# Patient Record
Sex: Female | Born: 1980 | Race: Black or African American | Hispanic: No | Marital: Single | Smoking: Never smoker
Health system: Southern US, Community
[De-identification: ages and names within clinical notes are randomized; demographics above are authoritative.]

## PROBLEM LIST (undated history)

## (undated) DIAGNOSIS — I10 Essential (primary) hypertension: Secondary | ICD-10-CM

## (undated) DIAGNOSIS — T7840XA Allergy, unspecified, initial encounter: Secondary | ICD-10-CM

## (undated) DIAGNOSIS — D649 Anemia, unspecified: Secondary | ICD-10-CM

## (undated) HISTORY — DX: Anemia, unspecified: D64.9

## (undated) HISTORY — PX: TUBAL LIGATION: SHX77

## (undated) HISTORY — DX: Allergy, unspecified, initial encounter: T78.40XA

## (undated) HISTORY — PX: STENT PLACEMENT VASCULAR (ARMC HX): HXRAD1737

---
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
## 2003-04-20 LAB — HM PAP SMEAR: HM Pap smear: NORMAL

## 2005-03-14 ENCOUNTER — Emergency Department: Payer: Self-pay | Admitting: Unknown Physician Specialty

## 2005-03-21 ENCOUNTER — Emergency Department: Payer: Self-pay | Admitting: Internal Medicine

## 2005-06-17 ENCOUNTER — Emergency Department: Payer: Self-pay | Admitting: Emergency Medicine

## 2005-08-11 ENCOUNTER — Emergency Department: Payer: Self-pay | Admitting: Emergency Medicine

## 2007-07-24 ENCOUNTER — Emergency Department: Payer: Self-pay | Admitting: Emergency Medicine

## 2013-02-19 ENCOUNTER — Emergency Department: Payer: Self-pay | Admitting: Internal Medicine

## 2013-07-28 ENCOUNTER — Emergency Department: Payer: Self-pay | Admitting: Emergency Medicine

## 2014-04-11 ENCOUNTER — Emergency Department: Payer: Self-pay | Admitting: Emergency Medicine

## 2014-04-12 ENCOUNTER — Emergency Department: Payer: Self-pay | Admitting: Emergency Medicine

## 2016-02-07 LAB — HM HIV SCREENING LAB: HM HIV Screening: NEGATIVE

## 2017-05-17 ENCOUNTER — Emergency Department
Admission: EM | Admit: 2017-05-17 | Discharge: 2017-05-17 | Disposition: A | Discharge status: Home / Self Care | Payer: Self-pay | Attending: Emergency Medicine | Admitting: Emergency Medicine

## 2017-05-17 ENCOUNTER — Encounter: Payer: Self-pay | Admitting: Emergency Medicine

## 2017-05-17 ENCOUNTER — Emergency Department: Payer: Self-pay

## 2017-05-17 DIAGNOSIS — W2209XA Striking against other stationary object, initial encounter: Secondary | ICD-10-CM | POA: Insufficient documentation

## 2017-05-17 DIAGNOSIS — Y998 Other external cause status: Secondary | ICD-10-CM | POA: Insufficient documentation

## 2017-05-17 DIAGNOSIS — R51 Headache: Secondary | ICD-10-CM | POA: Insufficient documentation

## 2017-05-17 DIAGNOSIS — Y929 Unspecified place or not applicable: Secondary | ICD-10-CM | POA: Insufficient documentation

## 2017-05-17 DIAGNOSIS — S0181XA Laceration without foreign body of other part of head, initial encounter: Secondary | ICD-10-CM | POA: Insufficient documentation

## 2017-05-17 DIAGNOSIS — Y9389 Activity, other specified: Secondary | ICD-10-CM | POA: Insufficient documentation

## 2017-05-17 DIAGNOSIS — S0990XA Unspecified injury of head, initial encounter: Secondary | ICD-10-CM

## 2017-05-17 MED ORDER — LIDOCAINE-EPINEPHRINE-TETRACAINE (LET) SOLUTION
3.0000 mL | Freq: Once | NASAL | Status: DC
  Filled 2017-05-17: qty 3

## 2017-05-17 NOTE — ED Provider Notes (Signed)
[facility] EMERGENCY DEPARTMENT Provider Note   CSN: [id number] Arrival date & time: 05/17/17  8938     History   Chief Complaint Chief Complaint  Patient presents with  . Facial Laceration    HPI Kathleen [name] is a 37 y.o. female resents to the emergency department for evaluation of a laceration to the left side of her forehead.  Patient states around 2 in the morning she hit her head on the corner of a dresser.  She is uncertain if she lost consciousness.  She is having some mild pain around the area but denies any significant headache, nausea or vomiting.  She denies any dizziness, lightheadedness, neck pain, numbness tingling or radicular symptoms.  No other pain throughout her body.  The patient's pain is 5 out of 10.  She gets some relief with ibuprofen earlier.  Her tetanus is up-to-date.  HPI  History reviewed. No pertinent past medical history.  There are no active problems to display for this patient.    OB History    No data available       Home Medications    Prior to Admission medications   Not on File    Family History No family history on file.  Social History Social History   Tobacco Use  . Smoking status: Not on file  Substance Use Topics  . Alcohol use: Not on file  . Drug use: Not on file     Allergies   Patient has no known allergies.   Review of Systems Review of Systems  Constitutional: Negative for activity change, chills, fatigue and fever.  Eyes: Negative for visual disturbance.  Respiratory: Negative for cough, chest tightness and shortness of breath.   Cardiovascular: Negative for chest pain and leg swelling.  Gastrointestinal: Negative for abdominal pain, diarrhea, nausea and vomiting.  Genitourinary: Negative for dysuria.  Musculoskeletal: Negative for arthralgias and gait problem.  Skin: Negative for rash.  Neurological: Positive for headaches (.  Round area of laceration only). Negative for  dizziness, weakness and numbness.  Hematological: Negative for adenopathy.  Psychiatric/Behavioral: Negative for agitation, behavioral problems and confusion.     Physical Exam Updated Vital Signs BP (!) 146/107 (BP Location: Left Arm)   Pulse (!) 114   Temp 98.2 F (36.8 C) (Oral)   Resp 16   Ht 5\' 6"  (1.676 m)   Wt 79.4 kg (175 lb)   LMP 04/23/2017 (Approximate)   SpO2 100%   BMI 28.25 kg/m   Physical Exam  Constitutional: She is oriented to person, place, and time. She appears well-developed and well-nourished.  HENT:  Head: Normocephalic and atraumatic.  Right Ear: External ear normal.  Left Ear: External ear normal.  Eyes: Conjunctivae and EOM are normal. Pupils are equal, round, and reactive to light.  Neck: Normal range of motion.  Cardiovascular: Normal rate.  Pulmonary/Chest: Effort normal. No respiratory distress.  Musculoskeletal: Normal range of motion.  Neurological: She is alert and oriented to person, place, and time.  Skin: Skin is warm. No rash noted.  3 cm linear laceration on the left forehead, no palpable or visible foreign body.  Area is tender around the forehead, nontender around the supraorbital rim of the left eye.  Full range of motion of both eyes with normal pupils.  Psychiatric: She has a normal mood and affect. Her behavior is normal. Thought content normal.     ED Treatments / Results  Labs (all labs ordered are listed, but only abnormal  results are displayed) Labs Reviewed - No data to display  EKG  EKG Interpretation None       Radiology Ct Head Wo Contrast  Result Date: 05/17/2017 CLINICAL DATA:  37 year old female with acute headache following fall and head injury today. Initial encounter. EXAM: CT HEAD WITHOUT CONTRAST TECHNIQUE: Contiguous axial images were obtained from the base of the skull through the vertex without intravenous contrast. COMPARISON:  None. FINDINGS: Brain: No evidence of acute infarction, hemorrhage,  hydrocephalus, extra-axial collection or mass lesion/mass effect. Vascular: No hyperdense vessel or unexpected calcification. Skull: Normal. Negative for fracture or focal lesion. Sinuses/Orbits: No acute finding. Other: Left forehead soft tissue swelling/laceration noted. IMPRESSION: 1. No evidence of intracranial abnormality 2. Left forehead soft tissue swelling/laceration without fracture. Electronically Signed   By: Margarette Canada M.D.   On: 05/17/2017 10:47    Procedures .Marland KitchenLaceration Repair Date/Time: 05/17/2017 11:50 AM Performed by: Duanne Guess, PA-C Authorized by: Duanne Guess, PA-C   Consent:    Consent obtained:  Verbal   Consent given by:  Patient   Risks discussed:  Infection, pain, need for additional repair, poor wound healing and poor cosmetic result   Alternatives discussed:  No treatment Anesthesia (see MAR for exact dosages):    Anesthesia method:  Topical application   Topical anesthetic:  LET Laceration details:    Location:  Face   Face location:  Forehead   Length (cm):  3   Depth (mm):  3 Repair type:    Repair type:  Simple Pre-procedure details:    Preparation:  Patient was prepped and draped in usual sterile fashion Exploration:    Hemostasis achieved with:  LET   Wound exploration: wound explored through full range of motion     Contaminated: no   Treatment:    Area cleansed with:  Betadine and saline   Amount of cleaning:  Standard   Irrigation solution:  Sterile saline   Visualized foreign bodies/material removed: no   Skin repair:    Repair method:  Sutures   Suture size:  6-0   Suture technique:  Simple interrupted   Number of sutures:  5 Approximation:    Approximation:  Close   Vermilion border: well-aligned   Post-procedure details:    Dressing:  Adhesive bandage   Patient tolerance of procedure:  Tolerated well, no immediate complications   (including critical care time)  Medications Ordered in ED Medications    lidocaine-EPINEPHrine-tetracaine (LET) solution (not administered)     Initial Impression / Assessment and Plan / ED Course  I have reviewed the triage vital signs and the nursing notes.  Pertinent labs & imaging results that were available during my care of the patient were reviewed by me and considered in my medical decision making (see chart for details).     37 year old female in the emergency department for forehead trauma with laceration.  CT of the.  CT of the head reviewed by me today.  Laceration repaired with #5 6-0 nylon sutures.  Patient educated on signs and symptoms return to the ED for.  She will follow-up 5-6 days for suture removal.  Final Clinical Impressions(s) / ED Diagnoses   Final diagnoses:  Facial laceration, initial encounter  Injury of head, initial encounter    ED Discharge Orders    None       Duanne Guess, PA-C 05/17/17 1152    Darel Hong, MD 05/18/17 [phone]

## 2017-05-17 NOTE — ED Triage Notes (Signed)
Pt reports falling and hitting head on corner of TV stand, denies LOC. Pt reports she had been drinking, reports it was before 2am.

## 2017-05-17 NOTE — Discharge Instructions (Signed)
You may shower but do not submerge incision site laceration site under water.  Follow-up in 5-6 days for suture removal.  If any redness warmth or drainage return to the clinic sooner.  1 week after suture removal please apply prosil scar cream.

## 2017-05-24 ENCOUNTER — Encounter: Payer: Self-pay | Admitting: Emergency Medicine

## 2017-05-24 ENCOUNTER — Emergency Department
Admission: EM | Admit: 2017-05-24 | Discharge: 2017-05-24 | Disposition: A | Discharge status: Home / Self Care | Payer: Self-pay | Attending: Emergency Medicine | Admitting: Emergency Medicine

## 2017-05-24 DIAGNOSIS — W269XXD Contact with unspecified sharp object(s), subsequent encounter: Secondary | ICD-10-CM | POA: Insufficient documentation

## 2017-05-24 DIAGNOSIS — S0181XD Laceration without foreign body of other part of head, subsequent encounter: Secondary | ICD-10-CM | POA: Insufficient documentation

## 2017-05-24 DIAGNOSIS — Z4802 Encounter for removal of sutures: Secondary | ICD-10-CM | POA: Insufficient documentation

## 2017-05-24 NOTE — Discharge Instructions (Signed)
Follow-up with Dr. of your choice or [facility] if any continued problems.  You also need to have your blood pressure rechecked as it was elevated in the department today.  Blood pressure was 116/98.

## 2017-05-24 NOTE — ED Provider Notes (Signed)
   [facility] Emergency Department Provider Note  ___________________________________________   First MD Initiated Contact with Patient 05/24/17 1203     (approximate)  I have reviewed the triage vital signs and the nursing notes.   HISTORY  Chief Complaint Suture / Staple Removal   HPI Kathleen [name] is a 37 y.o. female is here for suture removal.  Patient was seen 7 days ago for laceration to her left forehead.  She denies any continued problems.  History reviewed. No pertinent past medical history.  There are no active problems to display for this patient.   History reviewed. No pertinent surgical history.  Prior to Admission medications   Not on File    Allergies Patient has no known allergies.  No family history on file.  Social History Social History   Tobacco Use  . Smoking status: Not on file  Substance Use Topics  . Alcohol use: Not on file  . Drug use: Not on file    Review of Systems Constitutional: No fever/chills Eyes: No visual changes. ENT: No complaints. Cardiovascular: Denies chest pain. Respiratory: Denies shortness of breath. Skin: Healing laceration. Neurological: Negative for headaches ____________________________________________   PHYSICAL EXAM:  VITAL SIGNS: ED Triage Vitals  Enc Vitals Group     BP 05/24/17 1105 (!) 116/98     Pulse Rate 05/24/17 1105 84     Resp 05/24/17 1105 18     Temp 05/24/17 1105 97.7 F (36.5 C)     Temp Source 05/24/17 1105 Oral     SpO2 05/24/17 1105 98 %     Weight 05/24/17 1105 175 lb (79.4 kg)     Height 05/24/17 1105 5\' 6"  (1.676 m)     Head Circumference --      Peak Flow --      Pain Score 05/24/17 1154 0     Pain Loc --      Pain Edu? --      Excl. in [place]? --    Constitutional: Alert and oriented. Well appearing and in no acute distress. Eyes: Conjunctivae are normal.  Head: Atraumatic. Neck: No stridor.   Cardiovascular:  Good peripheral  circulation. Respiratory: Normal respiratory effort.  No retractions. Lungs CTAB. Skin:  Skin is warm, dry.  Suture site on the left forehead is healing without any signs of infection. Psychiatric: Mood and affect are normal. Speech and behavior are normal.  ____________________________________________   LABS (all labs ordered are listed, but only abnormal results are displayed)  Labs Reviewed - No data to display   PROCEDURES  Procedure(s) performed: Sutures were removed by RN.  Procedures  Critical Care performed: No  ____________________________________________   INITIAL IMPRESSION / ASSESSMENT AND PLAN / ED COURSE  Patient will continue keeping area clean and dry and watch for any signs of infection.  She will follow-up with her PCP or [facility] if any continued problems.  ____________________________________________   FINAL CLINICAL IMPRESSION(S) / ED DIAGNOSES  Final diagnoses:  Encounter for removal of sutures     ED Discharge Orders    None       Note:  This document was prepared using Dragon voice recognition software and may include unintentional dictation errors.   Johnn Hai, PA-C 05/24/17 1230  Nena Polio, MD 05/24/17 [phone]

## 2017-05-24 NOTE — ED Notes (Signed)

## 2017-05-24 NOTE — ED Triage Notes (Signed)
Pt reports here for suture removal. Stitches on left side of forehead. Pt reports no concern for infection.

## 2018-02-21 ENCOUNTER — Encounter: Payer: Self-pay | Admitting: Nurse Practitioner

## 2018-02-22 ENCOUNTER — Ambulatory Visit (INDEPENDENT_AMBULATORY_CARE_PROVIDER_SITE_OTHER): Payer: Self-pay | Admitting: Nurse Practitioner

## 2018-02-22 ENCOUNTER — Encounter: Payer: Self-pay | Admitting: Nurse Practitioner

## 2018-02-22 ENCOUNTER — Other Ambulatory Visit: Payer: Self-pay

## 2018-02-22 VITALS — BP 148/96 | HR 94 | Temp 97.5°F | Ht 66.6 in | Wt 215.0 lb

## 2018-02-22 DIAGNOSIS — E669 Obesity, unspecified: Secondary | ICD-10-CM

## 2018-02-22 DIAGNOSIS — F418 Other specified anxiety disorders: Secondary | ICD-10-CM | POA: Insufficient documentation

## 2018-02-22 DIAGNOSIS — I10 Essential (primary) hypertension: Secondary | ICD-10-CM | POA: Insufficient documentation

## 2018-02-22 DIAGNOSIS — E66811 Obesity, class 1: Secondary | ICD-10-CM | POA: Insufficient documentation

## 2018-02-22 MED ORDER — LISINOPRIL 5 MG PO TABS: 5.0000 mg | ORAL_TABLET | Freq: Every day | ORAL | 3 refills | Status: DC

## 2018-02-22 NOTE — Assessment & Plan Note (Signed)
New diagnosis.  Patient self-pay.  Initiate Lisinopril 5 MG by mouth today.  Return in 4 weeks for follow-up visit with labs (will have insurance at this time).

## 2018-02-22 NOTE — Patient Instructions (Signed)
Buspirone tablets What is this medicine? BUSPIRONE (byoo SPYE rone) is used to treat anxiety disorders. This medicine may be used for other purposes; ask your health care provider or pharmacist if you have questions. COMMON BRAND NAME(S): BuSpar What should I tell my health care provider before I take this medicine? They need to know if you have any of these conditions: -kidney or liver disease -an unusual or allergic reaction to buspirone, other medicines, foods, dyes, or preservatives -pregnant or trying to get pregnant -breast-feeding How should I use this medicine? Take this medicine by mouth with a glass of water. Follow the directions on the prescription label. You may take this medicine with or without food. To ensure that this medicine always works the same way for you, you should take it either always with or always without food. Take your doses at regular intervals. Do not take your medicine more often than directed. Do not stop taking except on the advice of your doctor or health care professional. Talk to your pediatrician regarding the use of this medicine in children. Special care may be needed. Overdosage: If you think you have taken too much of this medicine contact a poison control center or emergency room at once. NOTE: This medicine is only for you. Do not share this medicine with others. What if I miss a dose? If you miss a dose, take it as soon as you can. If it is almost time for your next dose, take only that dose. Do not take double or extra doses. What may interact with this medicine? Do not take this medicine with any of the following medications: -linezolid -MAOIs like Carbex, Eldepryl, Marplan, Nardil, and Parnate -methylene blue -procarbazine This medicine may also interact with the following medications: -diazepam -digoxin -diltiazem -erythromycin -grapefruit juice -haloperidol -medicines for mental depression or mood problems -medicines for seizures like  carbamazepine, phenobarbital and phenytoin -nefazodone -other medications for anxiety -rifampin -ritonavir -some antifungal medicines like itraconazole, ketoconazole, and voriconazole -verapamil -warfarin This list may not describe all possible interactions. Give your health care provider a list of all the medicines, herbs, non-prescription drugs, or dietary supplements you use. Also tell them if you smoke, drink alcohol, or use illegal drugs. Some items may interact with your medicine. What should I watch for while using this medicine? Visit your doctor or health care professional for regular checks on your progress. It may take 1 to 2 weeks before your anxiety gets better. You may get drowsy or dizzy. Do not drive, use machinery, or do anything that needs mental alertness until you know how this drug affects you. Do not stand or sit up quickly, especially if you are an older patient. This reduces the risk of dizzy or fainting spells. Alcohol can make you more drowsy and dizzy. Avoid alcoholic drinks. What side effects may I notice from receiving this medicine? Side effects that you should report to your doctor or health care professional as soon as possible: -blurred vision or other vision changes -chest pain -confusion -difficulty breathing -feelings of hostility or anger -muscle aches and pains -numbness or tingling in hands or feet -ringing in the ears -skin rash and itching -vomiting -weakness Side effects that usually do not require medical attention (report to your doctor or health care professional if they continue or are bothersome): -disturbed dreams, nightmares -headache -nausea -restlessness or nervousness -sore throat and nasal congestion -stomach upset This list may not describe all possible side effects. Call your doctor for medical advice about side   effects. You may report side effects to FDA at 1-800-FDA-1088. Where should I keep my medicine? Keep out of the reach  of children. Store at room temperature below 30 degrees C (86 degrees F). Protect from light. Keep container tightly closed. Throw away any unused medicine after the expiration date. NOTE: This sheet is a summary. It may not cover all possible information. If you have questions about this medicine, talk to your doctor, pharmacist, or health care provider.  2018 [employer]/Gold Standard (2009-11-01 18:06:11) Sertraline tablets What is this medicine? SERTRALINE (SER tra leen) is used to treat depression. It may also be used to treat obsessive compulsive disorder, panic disorder, post-trauma stress, premenstrual dysphoric disorder (PMDD) or social anxiety. This medicine may be used for other purposes; ask your health care provider or pharmacist if you have questions. COMMON BRAND NAME(S): Zoloft What should I tell my health care provider before I take this medicine? They need to know if you have any of these conditions: -bleeding disorders -bipolar disorder or a family history of bipolar disorder -glaucoma -heart disease -high blood pressure -history of irregular heartbeat -history of low levels of calcium, magnesium, or potassium in the blood -if you often drink alcohol -liver disease -receiving electroconvulsive therapy -seizures -suicidal thoughts, plans, or attempt; a previous suicide attempt by you or a family member -take medicines that treat or prevent blood clots -thyroid disease -an unusual or allergic reaction to sertraline, other medicines, foods, dyes, or preservatives -pregnant or trying to get pregnant -breast-feeding How should I use this medicine? Take this medicine by mouth with a glass of water. Follow the directions on the prescription label. You can take it with or without food. Take your medicine at regular intervals. Do not take your medicine more often than directed. Do not stop taking this medicine suddenly except upon the advice of your doctor. Stopping this medicine  too quickly may cause serious side effects or your condition may worsen. A special MedGuide will be given to you by the pharmacist with each prescription and refill. Be sure to read this information carefully each time. Talk to your pediatrician regarding the use of this medicine in children. While this drug may be prescribed for children as young as 7 years for selected conditions, precautions do apply. Overdosage: If you think you have taken too much of this medicine contact a poison control center or emergency room at once. NOTE: This medicine is only for you. Do not share this medicine with others. What if I miss a dose? If you miss a dose, take it as soon as you can. If it is almost time for your next dose, take only that dose. Do not take double or extra doses. What may interact with this medicine? Do not take this medicine with any of the following medications: -cisapride -dofetilide -dronedarone -linezolid -MAOIs like Carbex, Eldepryl, Marplan, Nardil, and Parnate -methylene blue (injected into a vein) -pimozide -thioridazine This medicine may also interact with the following medications: -alcohol -amphetamines -aspirin and aspirin-like medicines -certain medicines for depression, anxiety, or psychotic disturbances -certain medicines for fungal infections like ketoconazole, fluconazole, posaconazole, and itraconazole -certain medicines for irregular heart beat like flecainide, quinidine, propafenone -certain medicines for migraine headaches like almotriptan, eletriptan, frovatriptan, naratriptan, rizatriptan, sumatriptan, zolmitriptan -certain medicines for sleep -certain medicines for seizures like carbamazepine, valproic acid, phenytoin -certain medicines that treat or prevent blood clots like warfarin, enoxaparin, dalteparin -cimetidine -digoxin -diuretics -fentanyl -isoniazid -lithium -NSAIDs, medicines for pain and inflammation, like ibuprofen or naproxen -other  medicines that prolong the QT interval (cause an abnormal heart rhythm) -rasagiline -safinamide -supplements like St. John's wort, kava kava, valerian -tolbutamide -tramadol -tryptophan This list may not describe all possible interactions. Give your health care provider a list of all the medicines, herbs, non-prescription drugs, or dietary supplements you use. Also tell them if you smoke, drink alcohol, or use illegal drugs. Some items may interact with your medicine. What should I watch for while using this medicine? Tell your doctor if your symptoms do not get better or if they get worse. Visit your doctor or health care professional for regular checks on your progress. Because it may take several weeks to see the full effects of this medicine, it is important to continue your treatment as prescribed by your doctor. Patients and their families should watch out for new or worsening thoughts of suicide or depression. Also watch out for sudden changes in feelings such as feeling anxious, agitated, panicky, irritable, hostile, aggressive, impulsive, severely restless, overly excited and hyperactive, or not being able to sleep. If this happens, especially at the beginning of treatment or after a change in dose, call your health care professional. Dennis Bast may get drowsy or dizzy. Do not drive, use machinery, or do anything that needs mental alertness until you know how this medicine affects you. Do not stand or sit up quickly, especially if you are an older patient. This reduces the risk of dizzy or fainting spells. Alcohol may interfere with the effect of this medicine. Avoid alcoholic drinks. Your mouth may get dry. Chewing sugarless gum or sucking hard candy, and drinking plenty of water may help. Contact your doctor if the problem does not go away or is severe. What side effects may I notice from receiving this medicine? Side effects that you should report to your doctor or health care professional as soon  as possible: -allergic reactions like skin rash, itching or hives, swelling of the face, lips, or tongue -anxious -black, tarry stools -changes in vision -confusion -elevated mood, decreased need for sleep, racing thoughts, impulsive behavior -eye pain -fast, irregular heartbeat -feeling faint or lightheaded, falls -feeling agitated, angry, or irritable -hallucination, loss of contact with reality -loss of balance or coordination -loss of memory -painful or prolonged erections -restlessness, pacing, inability to keep still -seizures -stiff muscles -suicidal thoughts or other mood changes -trouble sleeping -unusual bleeding or bruising -unusually weak or tired -vomiting Side effects that usually do not require medical attention (report to your doctor or health care professional if they continue or are bothersome): -change in appetite or weight -change in sex drive or performance -diarrhea -increased sweating -indigestion, nausea -tremors This list may not describe all possible side effects. Call your doctor for medical advice about side effects. You may report side effects to FDA at 1-800-FDA-1088. Where should I keep my medicine? Keep out of the reach of children. Store at room temperature between 15 and 30 degrees C (59 and 86 degrees F). Throw away any unused medicine after the expiration date. NOTE: This sheet is a summary. It may not cover all possible information. If you have questions about this medicine, talk to your doctor, pharmacist, or health care provider.  2018 [employer]/Gold Standard (2016-03-28 14:17:49) Citalopram tablets What is this medicine? CITALOPRAM (sye TAL oh pram) is a medicine for depression. This medicine may be used for other purposes; ask your health care provider or pharmacist if you have questions. COMMON BRAND NAME(S): Celexa What should I tell my health care provider before I  take this medicine? They need to know if you have any of these  conditions: -bleeding disorders -bipolar disorder or a family history of bipolar disorder -glaucoma -heart disease -history of irregular heartbeat -kidney disease -liver disease -low levels of magnesium or potassium in the blood -receiving electroconvulsive therapy -seizures -suicidal thoughts, plans, or attempt; a previous suicide attempt by you or a family member -take medicines that treat or prevent blood clots -thyroid disease -an unusual or allergic reaction to citalopram, escitalopram, other medicines, foods, dyes, or preservatives -pregnant or trying to become pregnant -breast-feeding How should I use this medicine? Take this medicine by mouth with a glass of water. Follow the directions on the prescription label. You can take it with or without food. Take your medicine at regular intervals. Do not take your medicine more often than directed. Do not stop taking this medicine suddenly except upon the advice of your doctor. Stopping this medicine too quickly may cause serious side effects or your condition may worsen. A special MedGuide will be given to you by the pharmacist with each prescription and refill. Be sure to read this information carefully each time. Talk to your pediatrician regarding the use of this medicine in children. Special care may be needed. Patients over 52 years old may have a stronger reaction and need a smaller dose. Overdosage: If you think you have taken too much of this medicine contact a poison control center or emergency room at once. NOTE: This medicine is only for you. Do not share this medicine with others. What if I miss a dose? If you miss a dose, take it as soon as you can. If it is almost time for your next dose, take only that dose. Do not take double or extra doses. What may interact with this medicine? Do not take this medicine with any of the following medications: -certain medicines for fungal infections like fluconazole, itraconazole,  ketoconazole, posaconazole, voriconazole -cisapride -dofetilide -dronedarone -escitalopram -linezolid -MAOIs like Carbex, Eldepryl, Marplan, Nardil, and Parnate -methylene blue (injected into a vein) -pimozide -thioridazine -ziprasidone This medicine may also interact with the following medications: -alcohol -amphetamines -aspirin and aspirin-like medicines -carbamazepine -certain medicines for depression, anxiety, or psychotic disturbances -certain medicines for infections like chloroquine, clarithromycin, erythromycin, furazolidone, isoniazid, pentamidine -certain medicines for migraine headaches like almotriptan, eletriptan, frovatriptan, naratriptan, rizatriptan, sumatriptan, zolmitriptan -certain medicines for sleep -certain medicines that treat or prevent blood clots like dalteparin, enoxaparin, warfarin -cimetidine -diuretics -fentanyl -lithium -methadone -metoprolol -NSAIDs, medicines for pain and inflammation, like ibuprofen or naproxen -omeprazole -other medicines that prolong the QT interval (cause an abnormal heart rhythm) -procarbazine -rasagiline -supplements like St. John's wort, kava kava, valerian -tramadol -tryptophan This list may not describe all possible interactions. Give your health care provider a list of all the medicines, herbs, non-prescription drugs, or dietary supplements you use. Also tell them if you smoke, drink alcohol, or use illegal drugs. Some items may interact with your medicine. What should I watch for while using this medicine? Tell your doctor if your symptoms do not get better or if they get worse. Visit your doctor or health care professional for regular checks on your progress. Because it may take several weeks to see the full effects of this medicine, it is important to continue your treatment as prescribed by your doctor. Patients and their families should watch out for new or worsening thoughts of suicide or depression. Also watch  out for sudden changes in feelings such as feeling anxious, agitated, panicky, irritable, hostile,  aggressive, impulsive, severely restless, overly excited and hyperactive, or not being able to sleep. If this happens, especially at the beginning of treatment or after a change in dose, call your health care professional. Dennis Bast may get drowsy or dizzy. Do not drive, use machinery, or do anything that needs mental alertness until you know how this medicine affects you. Do not stand or sit up quickly, especially if you are an older patient. This reduces the risk of dizzy or fainting spells. Alcohol may interfere with the effect of this medicine. Avoid alcoholic drinks. Your mouth may get dry. Chewing sugarless gum or sucking hard candy, and drinking plenty of water will help. Contact your doctor if the problem does not go away or is severe. What side effects may I notice from receiving this medicine? Side effects that you should report to your doctor or health care professional as soon as possible: -allergic reactions like skin rash, itching or hives, swelling of the face, lips, or tongue -anxious -black, tarry stools -breathing problems -changes in vision -chest pain -confusion -elevated mood, decreased need for sleep, racing thoughts, impulsive behavior -eye pain -fast, irregular heartbeat -feeling faint or lightheaded, falls -feeling agitated, angry, or irritable -hallucination, loss of contact with reality -loss of balance or coordination -loss of memory -painful or prolonged erections -restlessness, pacing, inability to keep still -seizures -stiff muscles -suicidal thoughts or other mood changes -trouble sleeping -unusual bleeding or bruising -unusually weak or tired -vomiting Side effects that usually do not require medical attention (report to your doctor or health care professional if they continue or are bothersome): -change in appetite or weight -change in sex drive or  performance -dizziness -headache -increased sweating -indigestion, nausea -tremors This list may not describe all possible side effects. Call your doctor for medical advice about side effects. You may report side effects to FDA at 1-800-FDA-1088. Where should I keep my medicine? Keep out of reach of children. Store at room temperature between 15 and 30 degrees C (59 and 86 degrees F). Throw away any unused medicine after the expiration date. NOTE: This sheet is a summary. It may not cover all possible information. If you have questions about this medicine, talk to your doctor, pharmacist, or health care provider.  2018 [employer]/Gold Standard (2015-08-27 13:18:52)

## 2018-02-22 NOTE — Assessment & Plan Note (Signed)
Discussed benefit of diet and exercise focus.  Focusing on frequent, small healthy meals and 30 minutes of exercise 5 days a week.

## 2018-02-22 NOTE — Progress Notes (Signed)
BP (!) 148/96 (BP Location: Left Arm, Patient Position: Sitting)   Pulse 94   Temp (!) 97.5 F (36.4 C) (Oral)   Ht 5' 6.6" (1.692 m)   Wt 215 lb (97.5 kg)   SpO2 100%   BMI 34.08 kg/m    Subjective:    Patient ID: Kathleen [name], female    DOB: [birth year], 37 y.o.   MRN: [id number]  HPI: Kathleen [name] is a 37 y.o. female presents for new patient visit.  Introduced to Designer, jewellery role and practice setting.  All questions and concerns addressed.  Currently self-pay with no insurance while awaiting new job paper work.  Chief Complaint  Patient presents with  . Establish Care    pt would like to discuss about her blood pressure   HYPERTENSION: Ms. Pilkenton is a 37 yo [place] American female who presents for new patient visit with a form for provider to fill out.  Form is in regard to her current elevation in BP, as was noticed at her new workplace physical.  Has been out of work since beginning of October and then her car had issues, so many stressors and increased anxiety reported.  Currently, has opportunity to work for [employer] in [place] and they noted her BP was elevated when she was sent to [facility] for pre-employment physical.  She has been offered position, but needed a PCP visit and assessment of BP.  Discussed at length with her family history.  Mother with sickle cell disorder and CHF + arthritis.  Patient reports she has no h/o elevated BP, but states she has not been seen by a PCP in a few years.  We discussed various treatment options.    ANXIETY: Currently situational in appearance, as increased anxiety with loss of job recently and then recent elevation in BP at new workplace physical; causing a delay in her starting new job.  She reports this is the longest she has been without a job and it has been "very stressful".  Is a single mother and depends on income.  Discussed various treatment options available for treatment of anxiety.  At this time patient wishes to  read more on medications available.  Printed out information on Citalopram, Buspar, and Sertraline for patient.  GAD 7 : Generalized Anxiety Score 02/22/2018  Nervous, Anxious, on Edge 2  Control/stop worrying 3  Worry too much - different things 2  Trouble relaxing 0  Restless 0  Easily annoyed or irritable 3  Afraid - awful might happen 1  Total GAD 7 Score 11    Depression screen PHQ 2/9 02/22/2018  Decreased Interest 1  Down, Depressed, Hopeless 0  PHQ - 2 Score 1  Altered sleeping 1  Tired, decreased energy 1  Change in appetite 0  Feeling bad or failure about yourself  1  Trouble concentrating 0  Moving slowly or fidgety/restless 0  Suicidal thoughts 0  PHQ-9 Score 4  Difficult doing work/chores Not difficult at all   Functional Status Survey: Is the patient deaf or have difficulty hearing?: No Does the patient have difficulty seeing, even when wearing glasses/contacts?: No Does the patient have difficulty concentrating, remembering, or making decisions?: No Does the patient have difficulty walking or climbing stairs?: No Does the patient have difficulty dressing or bathing?: No Does the patient have difficulty doing errands alone such as visiting a doctor's office or shopping?: No   Social History   Socioeconomic History  . Marital status: Single  Spouse name: Not on file  . Number of children: Not on file  . Years of education: Not on file  . Highest education level: Not on file  Occupational History  . Not on file  Social Needs  . Financial resource strain: Very hard  . Food insecurity:    Worry: Never true    Inability: Never true  . Transportation needs:    Medical: No    Non-medical: No  Tobacco Use  . Smoking status: Never Smoker  . Smokeless tobacco: Never Used  Substance and Sexual Activity  . Alcohol use: Yes    Alcohol/week: 1.0 standard drinks    Types: 1 Glasses of wine per week  . Drug use: Not Currently  . Sexual activity: Yes    Lifestyle  . Physical activity:    Days per week: Not on file    Minutes per session: Not on file  . Stress: Not on file  Relationships  . Social connections:    Talks on phone: Not on file    Gets together: Not on file    Attends religious service: Not on file    Active member of club or organization: Not on file    Attends meetings of clubs or organizations: Not on file    Relationship status: Not on file  . Intimate partner violence:    Fear of current or ex partner: Not on file    Emotionally abused: Not on file    Physically abused: Not on file    Forced sexual activity: Not on file  Other Topics Concern  . Not on file  Social History Narrative  . Not on file    Relevant past medical, surgical, family and social history reviewed and updated as indicated. Interim medical history since our last visit reviewed. Allergies and medications reviewed and updated.  Review of Systems  Constitutional: Negative for activity change, appetite change, diaphoresis, fatigue, fever and unexpected weight change.  HENT: Negative.   Eyes: Negative for pain, redness and visual disturbance.  Respiratory: Negative for cough, chest tightness, shortness of breath and wheezing.   Cardiovascular: Negative for chest pain, palpitations and leg swelling.  Gastrointestinal: Negative for abdominal distention, abdominal pain, constipation, diarrhea, nausea and vomiting.  Endocrine: Negative for cold intolerance, heat intolerance, polydipsia, polyphagia and polyuria.  Genitourinary: Negative.   Musculoskeletal: Negative.   Skin: Negative.   Allergic/Immunologic: Negative.   Neurological: Negative for dizziness, syncope, weakness, light-headedness, numbness and headaches.  Psychiatric/Behavioral: Negative.     Per HPI unless specifically indicated above     Objective:    BP (!) 148/96 (BP Location: Left Arm, Patient Position: Sitting)   Pulse 94   Temp (!) 97.5 F (36.4 C) (Oral)   Ht 5' 6.6"  (1.692 m)   Wt 215 lb (97.5 kg)   SpO2 100%   BMI 34.08 kg/m   Wt Readings from Last 3 Encounters:  02/22/18 215 lb (97.5 kg)  05/24/17 175 lb (79.4 kg)  05/17/17 175 lb (79.4 kg)    Physical Exam  Constitutional: She is oriented to person, place, and time. She appears well-developed and well-nourished.  HENT:  Head: Normocephalic and atraumatic.  Eyes: Pupils are equal, round, and reactive to light. Conjunctivae and EOM are normal. Right eye exhibits no discharge. Left eye exhibits no discharge.  Neck: Normal range of motion. Neck supple. No JVD present. Carotid bruit is not present. No thyromegaly present.  Cardiovascular: Normal rate, regular rhythm and normal heart sounds. Exam reveals  no gallop.  No murmur heard. Pulmonary/Chest: Effort normal and breath sounds normal.  Abdominal: Soft. Bowel sounds are normal.  Musculoskeletal: Normal range of motion. She exhibits no edema.  Lymphadenopathy:    She has no cervical adenopathy.  Neurological: She is alert and oriented to person, place, and time.  Skin: Skin is warm and dry.  Psychiatric: She has a normal mood and affect. Her behavior is normal. Judgment and thought content normal.  Periods of tearfulness discussing current stressors.    No results found for this or any previous visit.    Assessment & Plan:   Problem List Items Addressed This Visit      Cardiovascular and Mediastinum   Essential hypertension - Primary    New diagnosis.  Patient self-pay.  Initiate Lisinopril 5 MG by mouth today.  Return in 4 weeks for follow-up visit with labs (will have insurance at this time).      Relevant Medications   lisinopril (PRINIVIL,ZESTRIL) 5 MG tablet     Other   Obesity (BMI 30.0-34.9)    Discussed benefit of diet and exercise focus.  Focusing on frequent, small healthy meals and 30 minutes of exercise 5 days a week.      Situational anxiety    GAD score 11 today.  Patient given information to read on Buspar,  Citalopram, and Sertraline.   At this time wishes to read about medications.  Current stressors due to being out of work over a month and working on new job placement.          Follow up plan: Return in about 4 weeks (around 03/22/2018) for follow-up visit.

## 2018-02-22 NOTE — Assessment & Plan Note (Signed)
GAD score 11 today.  Patient given information to read on Buspar, Citalopram, and Sertraline.   At this time wishes to read about medications.  Current stressors due to being out of work over a month and working on new job placement.

## 2018-03-24 ENCOUNTER — Ambulatory Visit: Payer: Self-pay | Admitting: Nurse Practitioner

## 2018-03-24 ENCOUNTER — Ambulatory Visit (INDEPENDENT_AMBULATORY_CARE_PROVIDER_SITE_OTHER): Payer: 59 | Admitting: Nurse Practitioner

## 2018-03-24 ENCOUNTER — Ambulatory Visit
Admission: RE | Admit: 2018-03-24 | Discharge: 2018-03-24 | Disposition: A | Discharge status: Home / Self Care | Payer: 59 | Source: Ambulatory Visit | Attending: Nurse Practitioner | Admitting: Nurse Practitioner

## 2018-03-24 ENCOUNTER — Other Ambulatory Visit: Payer: Self-pay

## 2018-03-24 ENCOUNTER — Encounter: Payer: Self-pay | Admitting: Nurse Practitioner

## 2018-03-24 VITALS — BP 135/93 | HR 97 | Temp 98.0°F | Ht 66.0 in | Wt 211.0 lb

## 2018-03-24 DIAGNOSIS — I10 Essential (primary) hypertension: Secondary | ICD-10-CM

## 2018-03-24 DIAGNOSIS — D1622 Benign neoplasm of long bones of left lower limb: Secondary | ICD-10-CM

## 2018-03-24 DIAGNOSIS — R238 Other skin changes: Secondary | ICD-10-CM

## 2018-03-24 DIAGNOSIS — Z1329 Encounter for screening for other suspected endocrine disorder: Secondary | ICD-10-CM

## 2018-03-24 DIAGNOSIS — Z13220 Encounter for screening for lipoid disorders: Secondary | ICD-10-CM

## 2018-03-24 DIAGNOSIS — E669 Obesity, unspecified: Secondary | ICD-10-CM

## 2018-03-24 DIAGNOSIS — R233 Spontaneous ecchymoses: Secondary | ICD-10-CM | POA: Insufficient documentation

## 2018-03-24 DIAGNOSIS — M7989 Other specified soft tissue disorders: Secondary | ICD-10-CM | POA: Insufficient documentation

## 2018-03-24 MED ORDER — LISINOPRIL 10 MG PO TABS: 10.0000 mg | ORAL_TABLET | Freq: Every day | ORAL | 3 refills | Status: DC

## 2018-03-24 NOTE — Assessment & Plan Note (Signed)
Benign per records.  Followed by [facility] and noted initially by them in 2016, was supposed to have repeat imaging in 2016 but missed this.  Will order left femur xray today and refer back to [facility] if noted growth or change.

## 2018-03-24 NOTE — Progress Notes (Signed)
Femur xray complete and noted osteochondroma measuring 7.9 cm.  Has not been seen by [facility] ortho since 2016.  Discussed with patient via telephone.  Will send referral back to [facility] ortho for continued monitoring and recommendations.

## 2018-03-24 NOTE — Patient Instructions (Signed)
Lisinopril tablets  What is this medicine?  LISINOPRIL (lyse IN oh pril) is an ACE inhibitor. This medicine is used to treat high blood pressure and heart failure. It is also used to protect the heart immediately after a heart attack.  This medicine may be used for other purposes; ask your health care provider or pharmacist if you have questions.  COMMON BRAND NAME(S): Prinivil, Zestril  What should I tell my health care provider before I take this medicine?  They need to know if you have any of these conditions:  -diabetes  -heart or blood vessel disease  -kidney disease  -low blood pressure  -previous swelling of the tongue, face, or lips with difficulty breathing, difficulty swallowing, hoarseness, or tightening of the throat  -an unusual or allergic reaction to lisinopril, other ACE inhibitors, insect venom, foods, dyes, or preservatives  -pregnant or trying to get pregnant  -breast-feeding  How should I use this medicine?  Take this medicine by mouth with a glass of water. Follow the directions on your prescription label. You may take this medicine with or without food. If it upsets your stomach, take it with food. Take your medicine at regular intervals. Do not take it more often than directed. Do not stop taking except on your doctor's advice.  Talk to your pediatrician regarding the use of this medicine in children. Special care may be needed. While this drug may be prescribed for children as young as 6 years of age for selected conditions, precautions do apply.  Overdosage: If you think you have taken too much of this medicine contact a poison control center or emergency room at once.  NOTE: This medicine is only for you. Do not share this medicine with others.  What if I miss a dose?  If you miss a dose, take it as soon as you can. If it is almost time for your next dose, take only that dose. Do not take double or extra doses.  What may interact with this medicine?  Do not take this medicine with any of  the following medications:  -hymenoptera venom  -sacubitril; valsartan  This medicines may also interact with the following medications:  -aliskiren  -angiotensin receptor blockers, like losartan or valsartan  -certain medicines for diabetes  -diuretics  -everolimus  -gold compounds  -lithium  -NSAIDs, medicines for pain and inflammation, like ibuprofen or naproxen  -potassium salts or supplements  -salt substitutes  -sirolimus  -temsirolimus  This list may not describe all possible interactions. Give your health care provider a list of all the medicines, herbs, non-prescription drugs, or dietary supplements you use. Also tell them if you smoke, drink alcohol, or use illegal drugs. Some items may interact with your medicine.  What should I watch for while using this medicine?  Visit your doctor or health care professional for regular check ups. Check your blood pressure as directed. Ask your doctor what your blood pressure should be, and when you should contact him or her.  Do not treat yourself for coughs, colds, or pain while you are using this medicine without asking your doctor or health care professional for advice. Some ingredients may increase your blood pressure.  Women should inform their doctor if they wish to become pregnant or think they might be pregnant. There is a potential for serious side effects to an unborn child. Talk to your health care professional or pharmacist for more information.  Check with your doctor or health care professional if you get an   attack of severe diarrhea, nausea and vomiting, or if you sweat a lot. The loss of too much body fluid can make it dangerous for you to take this medicine.  You may get drowsy or dizzy. Do not drive, use machinery, or do anything that needs mental alertness until you know how this drug affects you. Do not stand or sit up quickly, especially if you are an older patient. This reduces the risk of dizzy or fainting spells. Alcohol can make you more  drowsy and dizzy. Avoid alcoholic drinks.  Avoid salt substitutes unless you are told otherwise by your doctor or health care professional.  What side effects may I notice from receiving this medicine?  Side effects that you should report to your doctor or health care professional as soon as possible:  -allergic reactions like skin rash, itching or hives, swelling of the hands, feet, face, lips, throat, or tongue  -breathing problems  -signs and symptoms of kidney injury like trouble passing urine or change in the amount of urine  -signs and symptoms of increased potassium like muscle weakness; chest pain; or fast, irregular heartbeat  -signs and symptoms of liver injury like dark yellow or brown urine; general ill feeling or flu-like symptoms; light-colored stools; loss of appetite; nausea; right upper belly pain; unusually weak or tired; yellowing of the eyes or skin  -signs and symptoms of low blood pressure like dizziness; feeling faint or lightheaded, falls; unusually weak or tired  -stomach pain with or without nausea and vomiting  Side effects that usually do not require medical attention (report to your doctor or health care professional if they continue or are bothersome):  -changes in taste  -cough  -dizziness  -fever  -headache  -sensitivity to light  This list may not describe all possible side effects. Call your doctor for medical advice about side effects. You may report side effects to FDA at 1-800-FDA-1088.  Where should I keep my medicine?  Keep out of the reach of children.  Store at room temperature between 15 and 30 degrees C (59 and 86 degrees F). Protect from moisture. Keep container tightly closed. Throw away any unused medicine after the expiration date.  NOTE: This sheet is a summary. It may not cover all possible information. If you have questions about this medicine, talk to your doctor, pharmacist, or health care provider.  © 2019 Elsevier/Gold Standard (2015-05-14 12:52:35)

## 2018-03-24 NOTE — Assessment & Plan Note (Signed)
Continue focus on diet and exercise.

## 2018-03-24 NOTE — Addendum Note (Signed)
Addended by: Marnee Guarneri T on: 03/24/2018 04:47 PM   Modules accepted: Orders

## 2018-03-24 NOTE — Assessment & Plan Note (Signed)
Not at goal.  Increase Lisinopril to 10 MG daily and recheck BP in 4 weeks.  CMP and CBC today.

## 2018-03-24 NOTE — Assessment & Plan Note (Signed)
Intermittent with no pain.  Has h/o stent placement to left leg in [place] in 2018.  Will obtain vascular records from [place] and refer to vascular locally if required.

## 2018-03-24 NOTE — Progress Notes (Signed)
BP (!) 135/93 (BP Location: Left Arm)   Pulse 97   Temp 98 F (36.7 C) (Oral)   Ht 5\' 6"  (1.676 m)   Wt 211 lb (95.7 kg)   SpO2 99%   BMI 34.06 kg/m    Subjective:    Patient ID: Kathleen [name], female    DOB: [birth year], 37 y.o.   MRN: [id number]  HPI: Kathleen [name] is a 37 y.o. female presents for follow-up  Chief Complaint  Patient presents with  . Hypertension    f/u   HYPERTENSION Initiated Lisinopril 5 MG daily at last visit, 4 weeks ago.  Continues to run slightly above goal.  Family h/o HTN. Hypertension status: stable  Satisfied with current treatment? yes Duration of hypertension: months BP monitoring frequency:  not checking BP range:  BP medication side effects:  no Medication compliance: good compliance Aspirin: no Recurrent headaches: no Visual changes: no Palpitations: no Dyspnea: no Chest pain: no Lower extremity edema: no Dizzy/lightheaded: no   BENIGN OSTEOCHONDROMA: Has h/o of being followed by [facility] in 2016 and was diagnosed benign osteochondroma and recommended she have repeat femur imaging in 6 months, which she reports she did not know about and no repeat on chart noted.  She denies symptoms at this time.  Will repeat imaging and refer back to [facility] if findings on results.  LAst ortho visit 03/06/15 with no follow-up. Imaging from 2016 noted  "There is a pedunculated bony exostosis at the lateral distal femur cortex at metaphyseal level extending proximally, measuring 5 cm in longitudinal dimension and 3.8 cm in width.  There is broadening of the femur at the location of the mass. This bony exostosis is continuous with medullary cavity. There is irregular subchondral bone at the proximal tip of the mass. There are no fractures identified. There is no aggressive periosteal reaction identified".    SWELLING LEFT LEG: Has ongoing issues with swelling with left leg, intermittent with no pain or discomfort.  Reports having stent placed in leg  in [place] in 2018, no records available to review.  Will have her fill out release form and review [place] records.   Refer back to vascular if needed.  EASY BRUISING: She reports she has had easy bruising for years, noted more over past year.  Recent CBC 10/18/17 noted H/H 11.4/35.6, with normal MCV and platelet.  No current ASA or anticoag use.  Mother has sickle cell disorder.  She denies any blood in stool, recent bleeding episodes from wounds, or bleeding gums. Denies SOB, PICA, CP, fatigue, or cold intolerance.  Relevant past medical, surgical, family and social history reviewed and updated as indicated. Interim medical history since our last visit reviewed. Allergies and medications reviewed and updated.  Review of Systems  Constitutional: Negative for activity change, appetite change, fatigue and fever.  Respiratory: Negative for cough, chest tightness, shortness of breath and wheezing.   Cardiovascular: Negative for chest pain, palpitations and leg swelling.  Gastrointestinal: Negative for abdominal distention, abdominal pain, constipation, diarrhea, nausea and vomiting.  Endocrine: Negative.   Musculoskeletal: Negative.   Skin: Negative for color change and rash.  Neurological: Negative for dizziness, syncope, weakness, light-headedness, numbness and headaches.  Psychiatric/Behavioral: Negative.     Per HPI unless specifically indicated above     Objective:    BP (!) 135/93 (BP Location: Left Arm)   Pulse 97   Temp 98 F (36.7 C) (Oral)   Ht 5\' 6"  (1.676 m)   Wt 211  lb (95.7 kg)   SpO2 99%   BMI 34.06 kg/m   Wt Readings from Last 3 Encounters:  03/24/18 211 lb (95.7 kg)  02/22/18 215 lb (97.5 kg)  05/24/17 175 lb (79.4 kg)    Physical Exam Vitals signs and nursing note reviewed.  Constitutional:      Appearance: She is well-developed.  HENT:     Head: Normocephalic.  Eyes:     General:        Right eye: No discharge.        Left eye: No discharge.      Conjunctiva/sclera: Conjunctivae normal.     Pupils: Pupils are equal, round, and reactive to light.  Neck:     Musculoskeletal: Normal range of motion and neck supple.     Thyroid: No thyromegaly.     Vascular: No carotid bruit or JVD.  Cardiovascular:     Rate and Rhythm: Normal rate and regular rhythm.     Heart sounds: Normal heart sounds, S1 normal and S2 normal.  Pulmonary:     Effort: Pulmonary effort is normal.     Breath sounds: Normal breath sounds.  Abdominal:     General: Abdomen is flat. Bowel sounds are normal.     Palpations: Abdomen is soft.  Musculoskeletal:     Right lower leg: No edema.     Left lower leg: No edema.     Comments: Full range of motion all extremities with pain.  No masses noted to left femur.  Lymphadenopathy:     Cervical: No cervical adenopathy.  Skin:    General: Skin is warm and dry.  Neurological:     Mental Status: She is alert and oriented to person, place, and time.  Psychiatric:        Attention and Perception: Attention normal.        Mood and Affect: Mood normal.        Speech: Speech normal.        Behavior: Behavior normal.        Thought Content: Thought content normal.        Judgment: Judgment normal.     No results found for this or any previous visit.    Assessment & Plan:   Problem List Items Addressed This Visit      Cardiovascular and Mediastinum   Essential hypertension - Primary    Not at goal.  Increase Lisinopril to 10 MG daily and recheck BP in 4 weeks.  CMP and CBC today.      Relevant Medications   lisinopril (PRINIVIL,ZESTRIL) 10 MG tablet   Other Relevant Orders   CBC with Differential/Platelet   Comprehensive metabolic panel     Musculoskeletal and Integument   Osteochondroma of femur, left    Benign per records.  Followed by [facility] and noted initially by them in 2016, was supposed to have repeat imaging in 2016 but missed this.  Will order left femur xray today and refer back to [facility] if  noted growth or change.      Relevant Orders   DG FEMUR MIN 2 VIEWS LEFT     Other   Obesity (BMI 30.0-34.9)    Continue focus on diet and exercise.      Left leg swelling    Intermittent with no pain.  Has h/o stent placement to left leg in [place] in 2018.  Will obtain vascular records from [place] and refer to vascular locally if required.  Easy bruising    Mother with sickle cell.  Obtain CBC today.        Other Visit Diagnoses    Thyroid disorder screen       Reports h/o being told her thyroid levels were "not right"   Relevant Orders   Thyroid Panel With TSH   Screening cholesterol level       Relevant Orders   Lipid Panel w/o Chol/HDL Ratio       Follow up plan: Return in about 4 weeks (around 04/21/2018) for HTN.

## 2018-03-24 NOTE — Assessment & Plan Note (Signed)
Mother with sickle cell.  Obtain CBC today.

## 2018-03-25 LAB — CBC WITH DIFFERENTIAL/PLATELET
Basophils Absolute: 0.1 10*3/uL (ref 0.0–0.2)
Basos: 1 %
EOS (ABSOLUTE): 0.1 10*3/uL (ref 0.0–0.4)
EOS: 1 %
HEMATOCRIT: 37 % (ref 34.0–46.6)
HEMOGLOBIN: 11.8 g/dL (ref 11.1–15.9)
Immature Grans (Abs): 0 10*3/uL (ref 0.0–0.1)
Immature Granulocytes: 0 %
Lymphocytes Absolute: 2.6 10*3/uL (ref 0.7–3.1)
Lymphs: 38 %
MCH: 26.2 pg — ABNORMAL LOW (ref 26.6–33.0)
MCHC: 31.9 g/dL (ref 31.5–35.7)
MCV: 82 fL (ref 79–97)
Monocytes Absolute: 0.7 10*3/uL (ref 0.1–0.9)
Monocytes: 10 %
Neutrophils Absolute: 3.4 10*3/uL (ref 1.4–7.0)
Neutrophils: 50 %
Platelets: 295 10*3/uL (ref 150–450)
RBC: 4.5 x10E6/uL (ref 3.77–5.28)
RDW: 14.5 % (ref 12.3–15.4)
WBC: 6.8 10*3/uL (ref 3.4–10.8)

## 2018-03-25 LAB — THYROID PANEL WITH TSH
Free Thyroxine Index: 2.5 (ref 1.2–4.9)
T3 Uptake Ratio: 29 % (ref 24–39)
T4, Total: 8.6 ug/dL (ref 4.5–12.0)
TSH: 0.755 u[IU]/mL (ref 0.450–4.500)

## 2018-03-25 LAB — COMPREHENSIVE METABOLIC PANEL
ALT: 9 IU/L (ref 0–32)
AST: 12 IU/L (ref 0–40)
Albumin/Globulin Ratio: 1.4 (ref 1.2–2.2)
Albumin: 4.1 g/dL (ref 3.5–5.5)
Alkaline Phosphatase: 59 IU/L (ref 39–117)
BUN/Creatinine Ratio: 8 — ABNORMAL LOW (ref 9–23)
BUN: 7 mg/dL (ref 6–20)
Bilirubin Total: 0.9 mg/dL (ref 0.0–1.2)
CO2: 23 mmol/L (ref 20–29)
Calcium: 9.2 mg/dL (ref 8.7–10.2)
Chloride: 104 mmol/L (ref 96–106)
Creatinine, Ser: 0.85 mg/dL (ref 0.57–1.00)
GFR calc Af Amer: 102 mL/min/{1.73_m2} (ref >59)
GFR calc non Af Amer: 88 mL/min/{1.73_m2} (ref >59)
Globulin, Total: 3 g/dL (ref 1.5–4.5)
Glucose: 91 mg/dL (ref 65–99)
Potassium: 4.2 mmol/L (ref 3.5–5.2)
Sodium: 140 mmol/L (ref 134–144)
Total Protein: 7.1 g/dL (ref 6.0–8.5)

## 2018-03-25 LAB — LIPID PANEL W/O CHOL/HDL RATIO
CHOLESTEROL TOTAL: 144 mg/dL (ref 100–199)
HDL: 60 mg/dL (ref >39)
LDL Calculated: 76 mg/dL (ref 0–99)
Triglycerides: 42 mg/dL (ref 0–149)
VLDL Cholesterol Cal: 8 mg/dL (ref 5–40)

## 2018-03-25 NOTE — Progress Notes (Signed)
Normal test results noted.  Please call patient and make them aware of normal results and will continue to monitor at regular visits.  Have a great day

## 2018-04-11 ENCOUNTER — Other Ambulatory Visit: Payer: Self-pay

## 2018-04-11 ENCOUNTER — Emergency Department
Admission: EM | Admit: 2018-04-11 | Discharge: 2018-04-11 | Disposition: A | Discharge status: Home / Self Care | Payer: 59 | Attending: Emergency Medicine | Admitting: Emergency Medicine

## 2018-04-11 ENCOUNTER — Emergency Department: Payer: 59

## 2018-04-11 ENCOUNTER — Encounter: Payer: Self-pay | Admitting: Emergency Medicine

## 2018-04-11 DIAGNOSIS — J101 Influenza due to other identified influenza virus with other respiratory manifestations: Secondary | ICD-10-CM | POA: Diagnosis not present

## 2018-04-11 DIAGNOSIS — I10 Essential (primary) hypertension: Secondary | ICD-10-CM | POA: Insufficient documentation

## 2018-04-11 DIAGNOSIS — R509 Fever, unspecified: Secondary | ICD-10-CM | POA: Diagnosis present

## 2018-04-11 DIAGNOSIS — Z79899 Other long term (current) drug therapy: Secondary | ICD-10-CM | POA: Insufficient documentation

## 2018-04-11 DIAGNOSIS — B9789 Other viral agents as the cause of diseases classified elsewhere: Secondary | ICD-10-CM | POA: Diagnosis not present

## 2018-04-11 DIAGNOSIS — J069 Acute upper respiratory infection, unspecified: Secondary | ICD-10-CM

## 2018-04-11 LAB — URINALYSIS, COMPLETE (UACMP) WITH MICROSCOPIC
Bacteria, UA: NONE SEEN
Bilirubin Urine: NEGATIVE
Glucose, UA: NEGATIVE mg/dL
HGB URINE DIPSTICK: NEGATIVE
KETONES UR: NEGATIVE mg/dL
Leukocytes, UA: NEGATIVE
NITRITE: NEGATIVE
Protein, ur: NEGATIVE mg/dL
Specific Gravity, Urine: 1.01 (ref 1.005–1.030)
pH: 6 (ref 5.0–8.0)

## 2018-04-11 LAB — INFLUENZA PANEL BY PCR (TYPE A & B)
Influenza A By PCR: NEGATIVE
Influenza B By PCR: POSITIVE — AB

## 2018-04-11 LAB — POCT PREGNANCY, URINE: Preg Test, Ur: NEGATIVE

## 2018-04-11 MED ORDER — OSELTAMIVIR PHOSPHATE 75 MG PO CAPS: 75.0000 mg | ORAL_CAPSULE | Freq: Two times a day (BID) | ORAL | 0 refills | Status: DC

## 2018-04-11 MED ORDER — HYDROCOD POLST-CPM POLST ER 10-8 MG/5ML PO SUER: 5.0000 mL | Freq: Two times a day (BID) | ORAL | 0 refills | Status: DC

## 2018-04-11 MED ORDER — HYDROCOD POLST-CPM POLST ER 10-8 MG/5ML PO SUER
5.0000 mL | Freq: Once | ORAL | Status: AC
  Administered 2018-04-11: 5 mL via ORAL
  Filled 2018-04-11: qty 5

## 2018-04-11 NOTE — ED Provider Notes (Signed)
[facility] Emergency Department Provider Note  ____________________________________________   First MD Initiated Contact with Patient 04/11/18 [phone]     (approximate)  I have reviewed the triage vital signs and the nursing notes.   HISTORY  Chief Complaint URI    HPI Kathleen [name] is a 38 y.o. female who presents to the ED from home with a chief complaint of flulike symptoms.  Patient reports symptoms x2 days.  [name] was visiting her for Christmas who had influenza.  Patient complains of subjective fevers, myalgias, nonproductive cough, mild frontal headache and sore throat.  Denies chest pain, shortness of breath, abdominal pain, nausea, vomiting, dysuria or diarrhea.  Denies recent travel or trauma.   Past medical history None   Patient Active Problem List   Diagnosis Date Noted  . 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 Left leg swelling 03/24/2018  . Easy bruising 03/24/2018  . Obesity (BMI 30.0-34.9) 02/22/2018  . Essential hypertension 02/22/2018  . Situational anxiety 02/22/2018    Past Surgical History:  Procedure Laterality Date  . STENT PLACEMENT VASCULAR ([place] HX)    . TUBAL LIGATION      Prior to Admission medications   Medication Sig Start Date End Date Taking? Authorizing Provider  chlorpheniramine-HYDROcodone (TUSSIONEX PENNKINETIC ER) 10-8 MG/5ML SUER Take 5 mLs by mouth 2 (two) times daily. 04/11/18   Paulette Blanch, MD  lisinopril (PRINIVIL,ZESTRIL) 10 MG tablet Take 1 tablet (10 mg total) by mouth daily. 03/24/18   Cannady, Henrine Screws T, NP  oseltamivir (TAMIFLU) 75 MG capsule Take 1 capsule (75 mg total) by mouth 2 (two) times daily. 04/11/18   Paulette Blanch, MD    Allergies Patient has no known allergies.  Family History  Problem Relation Age of Onset  . Congestive Heart Failure Mother   . Irritable bowel syndrome Sister     Social History Social History   Tobacco Use  . Smoking status: Never Smoker  .  Smokeless tobacco: Never Used  Substance Use Topics  . Alcohol use: Yes    Alcohol/week: 1.0 standard drinks    Types: 1 Glasses of wine per week  . Drug use: Not Currently    Review of Systems  Constitutional: Positive for fever and myalgias. Eyes: No visual changes. ENT: Positive for for nasal congestion and sore throat. Cardiovascular: Denies chest pain. Respiratory: Positive for cough.  Denies shortness of breath. Gastrointestinal: No abdominal pain.  No nausea, no vomiting.  No diarrhea.  No constipation. Genitourinary: Negative for dysuria. Musculoskeletal: Negative for back pain. Skin: Negative for rash. Neurological: Negative for headaches, focal weakness or numbness.   ____________________________________________   PHYSICAL EXAM:  VITAL SIGNS: ED Triage Vitals  Enc Vitals Group     BP 04/11/18 0150 (!) 138/97     Pulse Rate 04/11/18 0150 97     Resp 04/11/18 0150 20     Temp 04/11/18 0150 99.2 F (37.3 C)     Temp Source 04/11/18 0150 Oral     SpO2 04/11/18 0150 99 %     Weight 04/11/18 0151 210 lb (95.3 kg)     Height 04/11/18 0151 5\' 6"  (1.676 m)     Head Circumference --      Peak Flow --      Pain Score 04/11/18 0150 8     Pain Loc --      Pain Edu? --      Excl. in [place]? --     Constitutional: Alert and  oriented. Well appearing and in mild acute distress. Eyes: Conjunctivae are normal. PERRL. EOMI. Head: Atraumatic. Nose: Congestion/rhinnorhea. Mouth/Throat: Mucous membranes are moist.  Oropharynx mildly erythematous without tonsillar swelling, exudates or peritonsillar abscess.  There is no hoarse or muffled voice.  There is no drooling. Neck: No stridor.  Neck without meningismus. Hematological/Lymphatic/Immunilogical: No cervical lymphadenopathy. Cardiovascular: Normal rate, regular rhythm. Grossly normal heart sounds.  Good peripheral circulation. Respiratory: Normal respiratory effort.  No retractions. Lungs CTAB. Gastrointestinal: Soft and  nontender. No distention. No abdominal bruits. No CVA tenderness. Musculoskeletal: No lower extremity tenderness nor edema.  No joint effusions. Neurologic:  Normal speech and language. No gross focal neurologic deficits are appreciated. No gait instability. Skin:  Skin is warm, dry and intact. No rash noted.  No petechiae. Psychiatric: Mood and affect are normal. Speech and behavior are normal.  ____________________________________________   LABS (all labs ordered are listed, but only abnormal results are displayed)  Labs Reviewed  URINALYSIS, COMPLETE (UACMP) WITH MICROSCOPIC - Abnormal; Notable for the following components:      Result Value   Color, Urine YELLOW (*)    APPearance CLEAR (*)    All other components within normal limits  INFLUENZA PANEL BY PCR (TYPE A & B) - Abnormal; Notable for the following components:   Influenza B By PCR POSITIVE (*)    All other components within normal limits  POCT PREGNANCY, URINE   ____________________________________________  EKG  None ____________________________________________  RADIOLOGY  ED MD interpretation: No pneumonia  Official radiology report(s): Dg Chest 1 View  Result Date: 04/11/2018 CLINICAL DATA:  Cough and cold symptoms. EXAM: CHEST  1 VIEW COMPARISON:  None. FINDINGS: The cardiomediastinal contours are normal. The lungs are clear. Pulmonary vasculature is normal. No consolidation, pleural effusion, or pneumothorax. No acute osseous abnormalities are seen. IMPRESSION: No acute chest findings. Electronically Signed   By: Keith Rake M.D.   On: 04/11/2018 02:40    ____________________________________________   PROCEDURES  Procedure(s) performed: None  Procedures  Critical Care performed: No  ____________________________________________   INITIAL IMPRESSION / ASSESSMENT AND PLAN / ED COURSE  As part of my medical decision making, I reviewed the following data within the [place] notes reviewed and incorporated, Labs reviewed, Old chart reviewed, Radiograph reviewed and Notes from prior ED visits   38 year old female who presents with flulike symptoms.  She is influenza B positive.  Chest x-ray negative for community-acquired pneumonia.  Discussed with patient risk/benefits of Tamiflu.  Will provide prescription for Tamiflu as well as Tussionex.  Strict return precautions given.  Patient verbalizes understanding and agrees with plan of care.      ____________________________________________   FINAL CLINICAL IMPRESSION(S) / ED DIAGNOSES  Final diagnoses:  Influenza B  Viral URI with cough     ED Discharge Orders         Ordered    oseltamivir (TAMIFLU) 75 MG capsule  2 times daily     04/11/18 0537    chlorpheniramine-HYDROcodone (TUSSIONEX PENNKINETIC ER) 10-8 MG/5ML SUER  2 times daily     04/11/18 0537           Note:  This document was prepared using Dragon voice recognition software and may include unintentional dictation errors.    Paulette Blanch, MD 04/11/18 [phone]

## 2018-04-11 NOTE — ED Triage Notes (Signed)
Pt states that she has had a cough and cold since Thursday. Pt states that her grandson had the flu at Christmas. Pt states that she had Tylenol last a few hours ago and also been taking Mucinex without relief. Pt also states at this time that she has a HA. Pt is in NAD.

## 2018-04-11 NOTE — ED Notes (Signed)
Pt ambulatory with steady gait to treatment room 

## 2018-04-11 NOTE — Discharge Instructions (Signed)
1.  Start Tamiflu twice daily x5 days. 2.  You may take Tussionex as needed for cough. 3.  Drink plenty of fluids daily. 4.  Return to the ER for worsening symptoms, persistent vomiting, difficulty breathing or other concerns.

## 2018-04-21 ENCOUNTER — Ambulatory Visit (INDEPENDENT_AMBULATORY_CARE_PROVIDER_SITE_OTHER): Payer: 59 | Admitting: Nurse Practitioner

## 2018-04-21 ENCOUNTER — Other Ambulatory Visit: Payer: Self-pay

## 2018-04-21 ENCOUNTER — Encounter: Payer: Self-pay | Admitting: Nurse Practitioner

## 2018-04-21 VITALS — BP 116/83 | HR 78 | Temp 97.7°F | Ht 66.0 in | Wt 213.0 lb

## 2018-04-21 DIAGNOSIS — M25561 Pain in right knee: Secondary | ICD-10-CM

## 2018-04-21 DIAGNOSIS — E669 Obesity, unspecified: Secondary | ICD-10-CM | POA: Diagnosis not present

## 2018-04-21 DIAGNOSIS — I10 Essential (primary) hypertension: Secondary | ICD-10-CM

## 2018-04-21 NOTE — Assessment & Plan Note (Signed)
Negative exam today with full ROM.  Recommend simple treatment at home, heat/ice, Tylenol as needed, and Biofreeze at needed.  Return for worsening or continued symptoms.

## 2018-04-21 NOTE — Progress Notes (Signed)
BP 116/83   Pulse 78   Temp 97.7 F (36.5 C) (Oral)   Ht 5\' 6"  (1.676 m)   Wt 213 lb (96.6 kg)   SpO2 99%   BMI 34.38 kg/m    Subjective:    Patient ID: Kathleen [name], female    DOB: October 13, 1980, 38 y.o.   MRN: [id number]  HPI: Kathleen [name] is a 38 y.o. female presents for HTN follow-up  Chief Complaint  Patient presents with  . Hypertension    f/u   HYPERTENSION Hypertension status: controlled  Satisfied with current treatment? yes Duration of hypertension: months BP monitoring frequency:  not checking BP range:  BP medication side effects:  no Medication compliance: good compliance Previous BP meds: Lisinopril Aspirin: no Recurrent headaches: no Visual changes: no Palpitations: no Dyspnea: no Chest pain: no Lower extremity edema: no Dizzy/lightheaded: no   POSTERIOR RIGHT KNEE PAIN: Reports discomfort with bending knee backwards, squatting, or sitting "[place] style".  Present for a few weeks.  Present every day.  At worst is 7-8/10 and at best 2/10.  Not currently taking any treatment at home for this.  No history of similar pain.  Denies rash, difficulty walking, or edema.    Relevant past medical, surgical, family and social history reviewed and updated as indicated. Interim medical history since our last visit reviewed. Allergies and medications reviewed and updated.  Review of Systems  Constitutional: Negative for activity change, appetite change, diaphoresis, fatigue and fever.  Respiratory: Negative for cough, chest tightness and shortness of breath.   Cardiovascular: Negative for chest pain, palpitations and leg swelling.  Gastrointestinal: Negative for abdominal distention, abdominal pain, constipation, diarrhea, nausea and vomiting.  Endocrine: Negative for cold intolerance, heat intolerance, polydipsia, polyphagia and polyuria.  Musculoskeletal: Positive for arthralgias.  Neurological: Negative for dizziness, syncope, weakness,  light-headedness, numbness and headaches.  Psychiatric/Behavioral: Negative.     Per HPI unless specifically indicated above     Objective:    BP 116/83   Pulse 78   Temp 97.7 F (36.5 C) (Oral)   Ht 5\' 6"  (1.676 m)   Wt 213 lb (96.6 kg)   SpO2 99%   BMI 34.38 kg/m   Wt Readings from Last 3 Encounters:  04/21/18 213 lb (96.6 kg)  04/11/18 210 lb (95.3 kg)  03/24/18 211 lb (95.7 kg)    Physical Exam Vitals signs and nursing note reviewed.  Constitutional:      General: She is awake.     Appearance: She is well-developed.  HENT:     Head: Normocephalic.     Right Ear: Hearing normal.     Left Ear: Hearing normal.     Nose: Nose normal.     Mouth/Throat:     Mouth: Mucous membranes are moist.  Eyes:     General: Lids are normal.        Right eye: No discharge.        Left eye: No discharge.     Conjunctiva/sclera: Conjunctivae normal.     Pupils: Pupils are equal, round, and reactive to light.  Neck:     Musculoskeletal: Normal range of motion and neck supple.     Thyroid: No thyromegaly.     Vascular: No carotid bruit or JVD.  Cardiovascular:     Rate and Rhythm: Normal rate and regular rhythm.     Heart sounds: Normal heart sounds. No murmur. No gallop.   Pulmonary:     Effort: Pulmonary effort is normal.  Breath sounds: Normal breath sounds.  Abdominal:     General: Bowel sounds are normal.     Palpations: Abdomen is soft. There is no hepatomegaly or splenomegaly.  Musculoskeletal:     Right knee: She exhibits normal range of motion, no swelling, no effusion and no erythema. No tenderness found.     Left knee: She exhibits normal range of motion, no swelling, no effusion, no deformity and no erythema. No tenderness found.     Right lower leg: No edema.     Left lower leg: No edema.     Comments: No masses palpated in bilateral posterior knee.  Lymphadenopathy:     Cervical: No cervical adenopathy.  Skin:    General: Skin is warm and dry.    Neurological:     Mental Status: She is alert and oriented to person, place, and time.  Psychiatric:        Attention and Perception: Attention normal.        Mood and Affect: Mood normal.        Behavior: Behavior normal. Behavior is cooperative.        Thought Content: Thought content normal.        Judgment: Judgment normal.     Results for orders placed or performed during the hospital encounter of 04/11/18  Urinalysis, Complete w Microscopic  Result Value Ref Range   Color, Urine YELLOW (A) YELLOW   APPearance CLEAR (A) CLEAR   Specific Gravity, Urine 1.010 1.005 - 1.030   pH 6.0 5.0 - 8.0   Glucose, UA NEGATIVE NEGATIVE mg/dL   Hgb urine dipstick NEGATIVE NEGATIVE   Bilirubin Urine NEGATIVE NEGATIVE   Ketones, ur NEGATIVE NEGATIVE mg/dL   Protein, ur NEGATIVE NEGATIVE mg/dL   Nitrite NEGATIVE NEGATIVE   Leukocytes, UA NEGATIVE NEGATIVE   RBC / HPF 0-5 0 - 5 RBC/hpf   WBC, UA 0-5 0 - 5 WBC/hpf   Bacteria, UA NONE SEEN NONE SEEN   Squamous Epithelial / LPF 0-5 0 - 5   Mucus PRESENT   Influenza panel by PCR (type A & B)  Result Value Ref Range   Influenza A By PCR NEGATIVE NEGATIVE   Influenza B By PCR POSITIVE (A) NEGATIVE  Pregnancy, urine POC  Result Value Ref Range   Preg Test, Ur NEGATIVE NEGATIVE      Assessment & Plan:   Problem List Items Addressed This Visit      Cardiovascular and Mediastinum   Essential hypertension - Primary    Chronic, improved with Lisinopril and no ADR reported.  Continue current dose.  BMP today.      Relevant Orders   Basic Metabolic Panel (BMET)     Other   Obesity (BMI 30.0-34.9)    Continue focus on diet and exercise at home.      Posterior knee pain, right    Negative exam today with full ROM.  Recommend simple treatment at home, heat/ice, Tylenol as needed, and Biofreeze at needed.  Return for worsening or continued symptoms.          Follow up plan: Return in about 4 weeks (around 05/19/2018) for Physical Exam  with pap.

## 2018-04-21 NOTE — Assessment & Plan Note (Signed)
Continue focus on diet and exercise at home.

## 2018-04-21 NOTE — Patient Instructions (Signed)
DASH Eating Plan  DASH stands for "Dietary Approaches to Stop Hypertension." The DASH eating plan is a healthy eating plan that has been shown to reduce high blood pressure (hypertension). It may also reduce your risk for type 2 diabetes, heart disease, and stroke. The DASH eating plan may also help with weight loss.  What are tips for following this plan?    General guidelines   Avoid eating more than 2,300 mg (milligrams) of salt (sodium) a day. If you have hypertension, you may need to reduce your sodium intake to 1,500 mg a day.   Limit alcohol intake to no more than 1 drink a day for nonpregnant women and 2 drinks a day for men. One drink equals 12 oz of beer, 5 oz of wine, or 1 oz of hard liquor.   Work with your health care provider to maintain a healthy body weight or to lose weight. Ask what an ideal weight is for you.   Get at least 30 minutes of exercise that causes your heart to beat faster (aerobic exercise) most days of the week. Activities may include walking, swimming, or biking.   Work with your health care provider or diet and nutrition specialist (dietitian) to adjust your eating plan to your individual calorie needs.  Reading food labels     Check food labels for the amount of sodium per serving. Choose foods with less than 5 percent of the Daily Value of sodium. Generally, foods with less than 300 mg of sodium per serving fit into this eating plan.   To find whole grains, look for the word "whole" as the first word in the ingredient list.  Shopping   Buy products labeled as "low-sodium" or "no salt added."   Buy fresh foods. Avoid canned foods and premade or frozen meals.  Cooking   Avoid adding salt when cooking. Use salt-free seasonings or herbs instead of table salt or sea salt. Check with your health care provider or pharmacist before using salt substitutes.   Do not fry foods. Cook foods using healthy methods such as baking, boiling, grilling, and broiling instead.   Cook with  heart-healthy oils, such as olive, canola, soybean, or sunflower oil.  Meal planning   Eat a balanced diet that includes:  ? 5 or more servings of fruits and vegetables each day. At each meal, try to fill half of your plate with fruits and vegetables.  ? Up to 6-8 servings of whole grains each day.  ? Less than 6 oz of lean meat, poultry, or fish each day. A 3-oz serving of meat is about the same size as a deck of cards. One egg equals 1 oz.  ? 2 servings of low-fat dairy each day.  ? A serving of nuts, seeds, or beans 5 times each week.  ? Heart-healthy fats. Healthy fats called Omega-3 fatty acids are found in foods such as flaxseeds and coldwater fish, like sardines, salmon, and mackerel.   Limit how much you eat of the following:  ? Canned or prepackaged foods.  ? Food that is high in trans fat, such as fried foods.  ? Food that is high in saturated fat, such as fatty meat.  ? Sweets, desserts, sugary drinks, and other foods with added sugar.  ? Full-fat dairy products.   Do not salt foods before eating.   Try to eat at least 2 vegetarian meals each week.   Eat more home-cooked food and less restaurant, buffet, and fast food.     When eating at a restaurant, ask that your food be prepared with less salt or no salt, if possible.  What foods are recommended?  The items listed may not be a complete list. Talk with your dietitian about what dietary choices are best for you.  Grains  Whole-grain or whole-wheat bread. Whole-grain or whole-wheat pasta. Brown rice. Oatmeal. Quinoa. Bulgur. Whole-grain and low-sodium cereals. Pita bread. Low-fat, low-sodium crackers. Whole-wheat flour tortillas.  Vegetables  Fresh or frozen vegetables (raw, steamed, roasted, or grilled). Low-sodium or reduced-sodium tomato and vegetable juice. Low-sodium or reduced-sodium tomato sauce and tomato paste. Low-sodium or reduced-sodium canned vegetables.  Fruits  All fresh, dried, or frozen fruit. Canned fruit in natural juice (without  added sugar).  Meat and other protein foods  Skinless chicken or turkey. Ground chicken or turkey. Pork with fat trimmed off. Fish and seafood. Egg whites. Dried beans, peas, or lentils. Unsalted nuts, nut butters, and seeds. Unsalted canned beans. Lean cuts of beef with fat trimmed off. Low-sodium, lean deli meat.  Dairy  Low-fat (1%) or fat-free (skim) milk. Fat-free, low-fat, or reduced-fat cheeses. Nonfat, low-sodium ricotta or cottage cheese. Low-fat or nonfat yogurt. Low-fat, low-sodium cheese.  Fats and oils  Soft margarine without trans fats. Vegetable oil. Low-fat, reduced-fat, or light mayonnaise and salad dressings (reduced-sodium). Canola, safflower, olive, soybean, and sunflower oils. Avocado.  Seasoning and other foods  Herbs. Spices. Seasoning mixes without salt. Unsalted popcorn and pretzels. Fat-free sweets.  What foods are not recommended?  The items listed may not be a complete list. Talk with your dietitian about what dietary choices are best for you.  Grains  Baked goods made with fat, such as croissants, muffins, or some breads. Dry pasta or rice meal packs.  Vegetables  Creamed or fried vegetables. Vegetables in a cheese sauce. Regular canned vegetables (not low-sodium or reduced-sodium). Regular canned tomato sauce and paste (not low-sodium or reduced-sodium). Regular tomato and vegetable juice (not low-sodium or reduced-sodium). Pickles. Olives.  Fruits  Canned fruit in a light or heavy syrup. Fried fruit. Fruit in cream or butter sauce.  Meat and other protein foods  Fatty cuts of meat. Ribs. Fried meat. Bacon. Sausage. Bologna and other processed lunch meats. Salami. Fatback. Hotdogs. Bratwurst. Salted nuts and seeds. Canned beans with added salt. Canned or smoked fish. Whole eggs or egg yolks. Chicken or turkey with skin.  Dairy  Whole or 2% milk, cream, and half-and-half. Whole or full-fat cream cheese. Whole-fat or sweetened yogurt. Full-fat cheese. Nondairy creamers. Whipped toppings.  Processed cheese and cheese spreads.  Fats and oils  Butter. Stick margarine. Lard. Shortening. Ghee. Bacon fat. Tropical oils, such as coconut, palm kernel, or palm oil.  Seasoning and other foods  Salted popcorn and pretzels. Onion salt, garlic salt, seasoned salt, table salt, and sea salt. Worcestershire sauce. Tartar sauce. Barbecue sauce. Teriyaki sauce. Soy sauce, including reduced-sodium. Steak sauce. Canned and packaged gravies. Fish sauce. Oyster sauce. Cocktail sauce. Horseradish that you find on the shelf. Ketchup. Mustard. Meat flavorings and tenderizers. Bouillon cubes. Hot sauce and Tabasco sauce. Premade or packaged marinades. Premade or packaged taco seasonings. Relishes. Regular salad dressings.  Where to find more information:   National Heart, Lung, and Blood Institute: www.nhlbi.nih.gov   American Heart Association: www.heart.org  Summary   The DASH eating plan is a healthy eating plan that has been shown to reduce high blood pressure (hypertension). It may also reduce your risk for type 2 diabetes, heart disease, and stroke.   With the   DASH eating plan, you should limit salt (sodium) intake to 2,300 mg a day. If you have hypertension, you may need to reduce your sodium intake to 1,500 mg a day.   When on the DASH eating plan, aim to eat more fresh fruits and vegetables, whole grains, lean proteins, low-fat dairy, and heart-healthy fats.   Work with your health care provider or diet and nutrition specialist (dietitian) to adjust your eating plan to your individual calorie needs.  This information is not intended to replace advice given to you by your health care provider. Make sure you discuss any questions you have with your health care provider.  Document Released: 03/13/2011 Document Revised: 03/17/2016 Document Reviewed: 03/17/2016  Elsevier Interactive Patient Education  2019 [employer]

## 2018-04-21 NOTE — Assessment & Plan Note (Signed)
Chronic, improved with Lisinopril and no ADR reported.  Continue current dose.  BMP today.

## 2018-04-22 LAB — BASIC METABOLIC PANEL
BUN/Creatinine Ratio: 12 (ref 9–23)
BUN: 10 mg/dL (ref 6–20)
CO2: 24 mmol/L (ref 20–29)
Calcium: 8.8 mg/dL (ref 8.7–10.2)
Chloride: 106 mmol/L (ref 96–106)
Creatinine, Ser: 0.82 mg/dL (ref 0.57–1.00)
GFR calc Af Amer: 106 mL/min/{1.73_m2} (ref >59)
GFR calc non Af Amer: 92 mL/min/{1.73_m2} (ref >59)
Glucose: 89 mg/dL (ref 65–99)
Potassium: 4.2 mmol/L (ref 3.5–5.2)
SODIUM: 142 mmol/L (ref 134–144)

## 2018-04-22 NOTE — Progress Notes (Signed)
Normal test results noted.  Please call patient and make them aware of normal results and will continue to monitor at regular visits.  Have a great day.  Look forward to seeing you at your next visit.

## 2018-05-25 ENCOUNTER — Other Ambulatory Visit (HOSPITAL_COMMUNITY)
Admission: RE | Admit: 2018-05-25 | Discharge: 2018-05-25 | Disposition: A | Discharge status: Home / Self Care | Payer: 59 | Source: Ambulatory Visit | Attending: Nurse Practitioner | Admitting: Nurse Practitioner

## 2018-05-25 ENCOUNTER — Ambulatory Visit (INDEPENDENT_AMBULATORY_CARE_PROVIDER_SITE_OTHER): Payer: 59 | Admitting: Nurse Practitioner

## 2018-05-25 ENCOUNTER — Encounter: Payer: Self-pay | Admitting: Nurse Practitioner

## 2018-05-25 VITALS — BP 119/83 | HR 82 | Temp 98.2°F | Ht 66.89 in | Wt 210.4 lb

## 2018-05-25 DIAGNOSIS — N760 Acute vaginitis: Secondary | ICD-10-CM

## 2018-05-25 DIAGNOSIS — B373 Candidiasis of vulva and vagina: Secondary | ICD-10-CM | POA: Insufficient documentation

## 2018-05-25 DIAGNOSIS — B9689 Other specified bacterial agents as the cause of diseases classified elsewhere: Secondary | ICD-10-CM | POA: Insufficient documentation

## 2018-05-25 DIAGNOSIS — I10 Essential (primary) hypertension: Secondary | ICD-10-CM

## 2018-05-25 DIAGNOSIS — M7989 Other specified soft tissue disorders: Secondary | ICD-10-CM | POA: Diagnosis not present

## 2018-05-25 DIAGNOSIS — B3731 Acute candidiasis of vulva and vagina: Secondary | ICD-10-CM | POA: Insufficient documentation

## 2018-05-25 DIAGNOSIS — Z0000 Encounter for general adult medical examination without abnormal findings: Secondary | ICD-10-CM

## 2018-05-25 DIAGNOSIS — F1290 Cannabis use, unspecified, uncomplicated: Secondary | ICD-10-CM | POA: Insufficient documentation

## 2018-05-25 DIAGNOSIS — Z23 Encounter for immunization: Secondary | ICD-10-CM

## 2018-05-25 DIAGNOSIS — R002 Palpitations: Secondary | ICD-10-CM | POA: Insufficient documentation

## 2018-05-25 LAB — WET PREP FOR TRICH, YEAST, CLUE
Clue Cell Exam: POSITIVE — AB
Trichomonas Exam: NEGATIVE
Yeast Exam: POSITIVE — AB

## 2018-05-25 MED ORDER — METRONIDAZOLE 500 MG PO TABS: 500.0000 mg | ORAL_TABLET | Freq: Two times a day (BID) | ORAL | 0 refills | Status: AC

## 2018-05-25 MED ORDER — FLUCONAZOLE 150 MG PO TABS: 150.0000 mg | ORAL_TABLET | Freq: Once | ORAL | 0 refills | Status: AC

## 2018-05-25 NOTE — Assessment & Plan Note (Addendum)
Referral to vascular surgery for evaluation. Discussed with patient the benefit of elevating lower extremities while seated and throughout the day as possible.

## 2018-05-25 NOTE — Addendum Note (Signed)
Addended by: Marnee Guarneri T on: 05/25/2018 02:40 PM   Modules accepted: Orders

## 2018-05-25 NOTE — Assessment & Plan Note (Signed)
BP well controlled on current medication regimen. Discussed with patient importance of taking medications daily as prescribed. Information provided on DASH diet. Continue medications as prescribed. Follow-up in 6 months.

## 2018-05-25 NOTE — Assessment & Plan Note (Addendum)
EKG performed today- results revealed NSR and normal axis. Recommended patient return if symptoms persist or worsen.

## 2018-05-25 NOTE — Progress Notes (Signed)
BP 119/83 (BP Location: Left Arm, Patient Position: Sitting, Cuff Size: Large)   Pulse 82   Temp 98.2 F (36.8 C)   Ht 5' 6.89" (1.699 m)   Wt 210 lb 6 oz (95.4 kg)   LMP 05/16/2018   SpO2 98%   BMI 33.06 kg/m    Subjective:    Patient ID: Kathleen [name], female    DOB: 1980/11/22, 38 y.o.   MRN: [id number]  HPI: Kathleen [name] is a 38 y.o. female presenting on 05/25/2018 for comprehensive medical examination. Current medical complaints include: Vaginal itching, left lower leg edema, and palpitations.  PALPITATIONS: Pt reports intermittent "fast heart beat" and "feeling my heart race" starting "a few" months ago. She reports this occurs during or shortly after smoking marijuana. She reports symptoms are brief and subside on their own after a few seconds to minutes. She has not tried anything to make them stop. She denies chest pain, shortness of breath, dizziness, or loss of consciousness during episodes.   LEFT LOWER LEG EDEMA: Pt reports chronic left lower leg edema for "about 14 years". She states she had a femoral stent placed "several years ago" in [place] and this is a chronic, ongoing issue. Edema worsens as the day progresses and resolves overnight. She has not tried anything to make it better. She denies numbness, tingling, discoloration, weakness, or pain in the extremity.        HYPERTENSION Pt taking lisinopril 10mg  daily.  Hypertension status: controlled  Satisfied with current treatment? yes Duration of hypertension: months BP monitoring frequency:  not checking BP range:  BP medication side effects:  no Medication compliance: good compliance Aspirin: no Recurrent headaches: no Visual changes: no Palpitations: yes Dyspnea: no Chest pain: no Lower extremity edema: yes- this was an issue prior to starting medication.  Dizzy/lightheaded: no . VAGINAL ITCHING: Duration: 2 days (started 05/23/2018) Dysuria: no Malodorous: no Discharge: no Urinary frequency:  no Fevers: no Abdominal pain: no Sexual activity: monogamous relationship History of sexually transmitted disease: no Recent antibiotic use: no Recent change in soap/lotion/detergents: Recent douching: Treatments: Monostat 1 day tried 2/17 am, symptoms improved slightly then returned.    She currently lives with: Menopausal Symptoms: no  Depression Screen done today and results listed below:  Depression screen [facility] 2/9 05/25/2018 02/22/2018  Decreased Interest 0 1  Down, Depressed, Hopeless 0 0  PHQ - 2 Score 0 1  Altered sleeping - 1  Tired, decreased energy - 1  Change in appetite - 0  Feeling bad or failure about yourself  - 1  Trouble concentrating - 0  Moving slowly or fidgety/restless - 0  Suicidal thoughts - 0  PHQ-9 Score - 4  Difficult doing work/chores - Not difficult at all    The patient does not have a history of falls. I did not complete a risk assessment for falls. A plan of care for falls was not documented.  Review of Systems  Constitutional: Negative for fever, malaise/fatigue and weight loss.  HENT: Negative for ear pain and sinus pain.   Eyes: Negative for blurred vision.  Respiratory: Negative for cough, shortness of breath and wheezing.   Cardiovascular: Positive for palpitations and leg swelling. Negative for chest pain.  Gastrointestinal: Negative for abdominal pain, constipation, diarrhea and heartburn.  Genitourinary: Negative for dysuria and urgency.       Pruritis  Musculoskeletal: Negative for myalgias.  Neurological: Negative for dizziness, weakness and headaches.  Psychiatric/Behavioral: Negative for depression. The patient is not  nervous/anxious.      Past Medical History:  History reviewed. No pertinent past medical history.  Surgical History:  Past Surgical History:  Procedure Laterality Date  . STENT PLACEMENT VASCULAR ([place] [facility])    . TUBAL LIGATION      Medications:  Current Outpatient Medications on File Prior to Visit    Medication Sig  . lisinopril (PRINIVIL,ZESTRIL) 10 MG tablet Take 1 tablet (10 mg total) by mouth daily.   No current facility-administered medications on file prior to visit.     Allergies:  No Known Allergies  Social History:  Social History   Socioeconomic History  . Marital status: Single    Spouse name: Not on file  . Number of children: Not on file  . Years of education: Not on file  . Highest education level: Not on file  Occupational History  . Not on file  Social Needs  . Financial resource strain: Very hard  . Food insecurity:    Worry: Never true    Inability: Never true  . Transportation needs:    Medical: No    Non-medical: No  Tobacco Use  . Smoking status: Never Smoker  . Smokeless tobacco: Never Used  Substance and Sexual Activity  . Alcohol use: Yes    Alcohol/week: 1.0 standard drinks    Types: 1 Glasses of wine per week  . Drug use: Not Currently  . Sexual activity: Yes  Lifestyle  . Physical activity:    Days per week: Not on file    Minutes per session: Not on file  . Stress: Not on file  Relationships  . Social connections:    Talks on phone: Not on file    Gets together: Not on file    Attends religious service: Not on file    Active member of club or organization: Not on file    Attends meetings of clubs or organizations: Not on file    Relationship status: Not on file  . Intimate partner violence:    Fear of current or ex partner: Not on file    Emotionally abused: Not on file    Physically abused: Not on file    Forced sexual activity: Not on file  Other Topics Concern  . Not on file  Social History Narrative  . Not on file   Social History   Tobacco Use  Smoking Status Never Smoker  Smokeless Tobacco Never Used   Social History   Substance and Sexual Activity  Alcohol Use Yes  . Alcohol/week: 1.0 standard drinks  . Types: 1 Glasses of wine per week    Family History:  Family History  Problem Relation Age of Onset   . Congestive Heart Failure Mother   . Irritable bowel syndrome Sister     Past medical history, surgical history, medications, allergies, family history and social history reviewed with patient today and changes made to appropriate areas of the chart.    All other ROS negative except what is listed above and in the HPI.      Objective:    BP 119/83 (BP Location: Left Arm, Patient Position: Sitting, Cuff Size: Large)   Pulse 82   Temp 98.2 F (36.8 C)   Ht 5' 6.89" (1.699 m)   Wt 210 lb 6 oz (95.4 kg)   LMP 05/16/2018   SpO2 98%   BMI 33.06 kg/m   Wt Readings from Last 3 Encounters:  05/25/18 210 lb 6 oz (95.4 kg)  04/21/18 213 lb (  96.6 kg)  04/11/18 210 lb (95.3 kg)    Physical Exam Vitals signs and nursing note reviewed. Exam conducted with a chaperone present.  Constitutional:      Appearance: She is well-developed.  HENT:     Head: Normocephalic and atraumatic.     Right Ear: Hearing, tympanic membrane, ear canal and external ear normal.     Left Ear: Hearing, tympanic membrane, ear canal and external ear normal.     Nose: Nose normal.     Right Sinus: No maxillary sinus tenderness or frontal sinus tenderness.     Left Sinus: No maxillary sinus tenderness or frontal sinus tenderness.  Eyes:     General:        Right eye: No discharge.        Left eye: No discharge.     Conjunctiva/sclera: Conjunctivae normal.     Pupils: Pupils are equal, round, and reactive to light.  Neck:     Musculoskeletal: Normal range of motion and neck supple.     Thyroid: No thyromegaly.     Vascular: No carotid bruit or JVD.  Cardiovascular:     Rate and Rhythm: Normal rate and regular rhythm.     Chest Wall: PMI is not displaced. No thrill.     Pulses:          Radial pulses are 2+ on the right side and 2+ on the left side.       Dorsalis pedis pulses are 2+ on the right side and 2+ on the left side.     Heart sounds: Normal heart sounds.  Pulmonary:     Effort: Pulmonary effort  is normal.     Breath sounds: Normal breath sounds.  Chest:     Chest wall: No mass, deformity or tenderness.     Breasts: Breasts are symmetrical.        Right: Normal. No mass, nipple discharge, skin change or tenderness.        Left: Normal. No mass, nipple discharge, skin change or tenderness.  Abdominal:     General: Bowel sounds are normal.     Palpations: Abdomen is soft. There is no hepatomegaly or splenomegaly.  Genitourinary:    Exam position: Lithotomy position.     Labia:        Right: No rash or tenderness.        Left: No rash or tenderness.      Vagina: Vaginal discharge (whitish thick) present.     Cervix: Discharge (whitish thick) present. No cervical motion tenderness, friability, erythema or cervical bleeding.     Uterus: Normal.      Adnexa: Right adnexa normal and left adnexa normal.     Rectum: Normal.  Musculoskeletal: Normal range of motion.     Right lower leg: No edema.     Left lower leg: 1+ Edema present.  Lymphadenopathy:     Cervical: No cervical adenopathy.     Upper Body:     Right upper body: No supraclavicular or axillary adenopathy.     Left upper body: No supraclavicular or axillary adenopathy.  Skin:    General: Skin is warm and dry.     Capillary Refill: Capillary refill takes less than 2 seconds.  Neurological:     Mental Status: She is alert and oriented to person, place, and time.     Deep Tendon Reflexes: Reflexes are normal and symmetric.     Reflex Scores:      Brachioradialis reflexes  are 2+ on the right side and 2+ on the left side.      Patellar reflexes are 2+ on the right side and 2+ on the left side. Psychiatric:        Behavior: Behavior normal.   EKG performed in office showing NSR and normal axis.   Results for orders placed or performed in visit on 02/63/78  Basic Metabolic Panel (BMET)  Result Value Ref Range   Glucose 89 65 - 99 mg/dL   BUN 10 6 - 20 mg/dL   Creatinine, Ser 0.82 0.57 - 1.00 mg/dL   GFR calc non Af  Amer 92 >59 mL/min/1.73   GFR calc Af Amer 106 >59 mL/min/1.73   BUN/Creatinine Ratio 12 9 - 23   Sodium 142 134 - 144 mmol/L   Potassium 4.2 3.5 - 5.2 mmol/L   Chloride 106 96 - 106 mmol/L   CO2 24 20 - 29 mmol/L   Calcium 8.8 8.7 - 10.2 mg/dL      Assessment & Plan:   Problem List Items Addressed This Visit      Cardiovascular and Mediastinum   Essential hypertension    BP well controlled on current medication regimen. Discussed with patient importance of taking medications daily as prescribed. Information provided on DASH diet. Continue medications as prescribed. Follow-up in 6 months.       Relevant Orders   EKG 12-Lead (Completed)     Genitourinary   Vaginal candidiasis    Wet prep performed in office today with yeast present. Diflucan prescribed. Pt instructed to take 1 pill today and remaining pill when finished with antibiotic regimen if symptoms persist. Pt instructed to return if symptoms worsen or do not improve with treatment.       Relevant Medications   metroNIDAZOLE (FLAGYL) 500 MG tablet   fluconazole (DIFLUCAN) 150 MG tablet   Other Relevant Orders   WET PREP FOR TRICH, YEAST, CLUE   Bacterial vaginosis    Wet prep performed today with clue cells present. Flagyl prescribed. Patient instructed to take entire course of medication, even if symptoms subside sooner. Instructions to return if symptoms worsen, fail to improve, or return after completion of antibiotic course.       Relevant Medications   metroNIDAZOLE (FLAGYL) 500 MG tablet   fluconazole (DIFLUCAN) 150 MG tablet     Other   Left leg swelling    Referral to vascular surgery for evaluation. Discussed with patient the benefit of elevating lower extremities while seated and throughout the day as possible.       Relevant Orders   Ambulatory referral to Vascular Surgery   Marijuana smoker    Discussed with patient marijuana smoking may exacerbate heart palpitations. Recommended quitting smoking if  symptoms persist.       Intermittent palpitations    EKG performed today- results revealed NSR and normal axis. Recommended patient return if symptoms persist or worsen.        Other Visit Diagnoses    Annual physical exam    -  Primary   Relevant Orders   Tdap vaccine greater than or equal to 7yo IM (Completed)   HIV Antibody (routine testing w rflx)       Follow up plan: Return in about 6 months (around 11/23/2018) for HTN .   LABORATORY TESTING:  - Pap smear: pap done  IMMUNIZATIONS:   - Tdap: Tetanus vaccination status reviewed: Td vaccination indicated and given today. - Influenza: PT refused - Pneumovax: Not applicable -  Prevnar: Not applicable - HPV: Not applicable - Zostavax vaccine: Not applicable  SCREENING: -Mammogram: Not applicable  - Colonoscopy: Not applicable  - Bone Density: Not applicable  -Hearing Test: Not applicable  -Spirometry: Not applicable   PATIENT COUNSELING:   Advised to take 1 mg of folate supplement per day if capable of pregnancy.   Sexuality: Discussed sexually transmitted diseases, partner selection, use of condoms, avoidance of unintended pregnancy  and contraceptive alternatives.   Advised to avoid cigarette smoking.  I discussed with the patient that most people either abstain from alcohol or drink within safe limits (<=14/week and <=4 drinks/occasion for males, <=7/weeks and <= 3 drinks/occasion for females) and that the risk for alcohol disorders and other health effects rises proportionally with the number of drinks per week and how often a drinker exceeds daily limits.  Discussed cessation/primary prevention of drug use and availability of treatment for abuse.   Diet: Encouraged to adjust caloric intake to maintain  or achieve ideal body weight, to reduce intake of dietary saturated fat and total fat, to limit sodium intake by avoiding high sodium foods and not adding table salt, and to maintain adequate dietary potassium and  calcium preferably from fresh fruits, vegetables, and low-fat dairy products.    stressed the importance of regular exercise  Injury prevention: Discussed safety belts, safety helmets, smoke detector, smoking near bedding or upholstery.   Dental health: Discussed importance of regular tooth brushing, flossing, and dental visits.    NEXT PREVENTATIVE PHYSICAL DUE IN 1 YEAR. Return in about 6 months (around 11/23/2018) for HTN .   NOTE WRITTEN BY [employer] DNP STUDENT.  ASSESSMENT AND PLAN OF CARE REVIEWED WITH STUDENT, AGREE WITH ABOVE FINDINGS AND PLAN.

## 2018-05-25 NOTE — Assessment & Plan Note (Signed)
Discussed with patient marijuana smoking may exacerbate heart palpitations. Recommended quitting smoking if symptoms persist.

## 2018-05-25 NOTE — Patient Instructions (Signed)
DASH Eating Plan  DASH stands for "Dietary Approaches to Stop Hypertension." The DASH eating plan is a healthy eating plan that has been shown to reduce high blood pressure (hypertension). It may also reduce your risk for type 2 diabetes, heart disease, and stroke. The DASH eating plan may also help with weight loss.  What are tips for following this plan?    General guidelines   Avoid eating more than 2,300 mg (milligrams) of salt (sodium) a day. If you have hypertension, you may need to reduce your sodium intake to 1,500 mg a day.   Limit alcohol intake to no more than 1 drink a day for nonpregnant women and 2 drinks a day for men. One drink equals 12 oz of beer, 5 oz of wine, or 1 oz of hard liquor.   Work with your health care provider to maintain a healthy body weight or to lose weight. Ask what an ideal weight is for you.   Get at least 30 minutes of exercise that causes your heart to beat faster (aerobic exercise) most days of the week. Activities may include walking, swimming, or biking.   Work with your health care provider or diet and nutrition specialist (dietitian) to adjust your eating plan to your individual calorie needs.  Reading food labels     Check food labels for the amount of sodium per serving. Choose foods with less than 5 percent of the Daily Value of sodium. Generally, foods with less than 300 mg of sodium per serving fit into this eating plan.   To find whole grains, look for the word "whole" as the first word in the ingredient list.  Shopping   Buy products labeled as "low-sodium" or "no salt added."   Buy fresh foods. Avoid canned foods and premade or frozen meals.  Cooking   Avoid adding salt when cooking. Use salt-free seasonings or herbs instead of table salt or sea salt. Check with your health care provider or pharmacist before using salt substitutes.   Do not fry foods. Cook foods using healthy methods such as baking, boiling, grilling, and broiling instead.   Cook with  heart-healthy oils, such as olive, canola, soybean, or sunflower oil.  Meal planning   Eat a balanced diet that includes:  ? 5 or more servings of fruits and vegetables each day. At each meal, try to fill half of your plate with fruits and vegetables.  ? Up to 6-8 servings of whole grains each day.  ? Less than 6 oz of lean meat, poultry, or fish each day. A 3-oz serving of meat is about the same size as a deck of cards. One egg equals 1 oz.  ? 2 servings of low-fat dairy each day.  ? A serving of nuts, seeds, or beans 5 times each week.  ? Heart-healthy fats. Healthy fats called Omega-3 fatty acids are found in foods such as flaxseeds and coldwater fish, like sardines, salmon, and mackerel.   Limit how much you eat of the following:  ? Canned or prepackaged foods.  ? Food that is high in trans fat, such as fried foods.  ? Food that is high in saturated fat, such as fatty meat.  ? Sweets, desserts, sugary drinks, and other foods with added sugar.  ? Full-fat dairy products.   Do not salt foods before eating.   Try to eat at least 2 vegetarian meals each week.   Eat more home-cooked food and less restaurant, buffet, and fast food.     When eating at a restaurant, ask that your food be prepared with less salt or no salt, if possible.  What foods are recommended?  The items listed may not be a complete list. Talk with your dietitian about what dietary choices are best for you.  Grains  Whole-grain or whole-wheat bread. Whole-grain or whole-wheat pasta. Brown rice. Oatmeal. Quinoa. Bulgur. Whole-grain and low-sodium cereals. Pita bread. Low-fat, low-sodium crackers. Whole-wheat flour tortillas.  Vegetables  Fresh or frozen vegetables (raw, steamed, roasted, or grilled). Low-sodium or reduced-sodium tomato and vegetable juice. Low-sodium or reduced-sodium tomato sauce and tomato paste. Low-sodium or reduced-sodium canned vegetables.  Fruits  All fresh, dried, or frozen fruit. Canned fruit in natural juice (without  added sugar).  Meat and other protein foods  Skinless chicken or turkey. Ground chicken or turkey. Pork with fat trimmed off. Fish and seafood. Egg whites. Dried beans, peas, or lentils. Unsalted nuts, nut butters, and seeds. Unsalted canned beans. Lean cuts of beef with fat trimmed off. Low-sodium, lean deli meat.  Dairy  Low-fat (1%) or fat-free (skim) milk. Fat-free, low-fat, or reduced-fat cheeses. Nonfat, low-sodium ricotta or cottage cheese. Low-fat or nonfat yogurt. Low-fat, low-sodium cheese.  Fats and oils  Soft margarine without trans fats. Vegetable oil. Low-fat, reduced-fat, or light mayonnaise and salad dressings (reduced-sodium). Canola, safflower, olive, soybean, and sunflower oils. Avocado.  Seasoning and other foods  Herbs. Spices. Seasoning mixes without salt. Unsalted popcorn and pretzels. Fat-free sweets.  What foods are not recommended?  The items listed may not be a complete list. Talk with your dietitian about what dietary choices are best for you.  Grains  Baked goods made with fat, such as croissants, muffins, or some breads. Dry pasta or rice meal packs.  Vegetables  Creamed or fried vegetables. Vegetables in a cheese sauce. Regular canned vegetables (not low-sodium or reduced-sodium). Regular canned tomato sauce and paste (not low-sodium or reduced-sodium). Regular tomato and vegetable juice (not low-sodium or reduced-sodium). Pickles. Olives.  Fruits  Canned fruit in a light or heavy syrup. Fried fruit. Fruit in cream or butter sauce.  Meat and other protein foods  Fatty cuts of meat. Ribs. Fried meat. Bacon. Sausage. Bologna and other processed lunch meats. Salami. Fatback. Hotdogs. Bratwurst. Salted nuts and seeds. Canned beans with added salt. Canned or smoked fish. Whole eggs or egg yolks. Chicken or turkey with skin.  Dairy  Whole or 2% milk, cream, and half-and-half. Whole or full-fat cream cheese. Whole-fat or sweetened yogurt. Full-fat cheese. Nondairy creamers. Whipped toppings.  Processed cheese and cheese spreads.  Fats and oils  Butter. Stick margarine. Lard. Shortening. Ghee. Bacon fat. Tropical oils, such as coconut, palm kernel, or palm oil.  Seasoning and other foods  Salted popcorn and pretzels. Onion salt, garlic salt, seasoned salt, table salt, and sea salt. Worcestershire sauce. Tartar sauce. Barbecue sauce. Teriyaki sauce. Soy sauce, including reduced-sodium. Steak sauce. Canned and packaged gravies. Fish sauce. Oyster sauce. Cocktail sauce. Horseradish that you find on the shelf. Ketchup. Mustard. Meat flavorings and tenderizers. Bouillon cubes. Hot sauce and Tabasco sauce. Premade or packaged marinades. Premade or packaged taco seasonings. Relishes. Regular salad dressings.  Where to find more information:   National Heart, Lung, and Blood Institute: www.nhlbi.nih.gov   American Heart Association: www.heart.org  Summary   The DASH eating plan is a healthy eating plan that has been shown to reduce high blood pressure (hypertension). It may also reduce your risk for type 2 diabetes, heart disease, and stroke.   With the   DASH eating plan, you should limit salt (sodium) intake to 2,300 mg a day. If you have hypertension, you may need to reduce your sodium intake to 1,500 mg a day.   When on the DASH eating plan, aim to eat more fresh fruits and vegetables, whole grains, lean proteins, low-fat dairy, and heart-healthy fats.   Work with your health care provider or diet and nutrition specialist (dietitian) to adjust your eating plan to your individual calorie needs.  This information is not intended to replace advice given to you by your health care provider. Make sure you discuss any questions you have with your health care provider.  Document Released: 03/13/2011 Document Revised: 03/17/2016 Document Reviewed: 03/17/2016  Elsevier Interactive Patient Education  2019 [employer]

## 2018-05-25 NOTE — Assessment & Plan Note (Addendum)
Wet prep performed today with clue cells present. Flagyl prescribed. Patient instructed to take entire course of medication, even if symptoms subside sooner. Instructions to return if symptoms worsen, fail to improve, or return after completion of antibiotic course.

## 2018-05-25 NOTE — Assessment & Plan Note (Addendum)
Wet prep performed in office today with yeast present. Diflucan prescribed. Pt instructed to take 1 pill today and remaining pill when finished with antibiotic regimen if symptoms persist. Pt instructed to return if symptoms worsen or do not improve with treatment.

## 2018-05-26 LAB — HIV ANTIBODY (ROUTINE TESTING W REFLEX): HIV Screen 4th Generation wRfx: NONREACTIVE

## 2018-05-31 LAB — CYTOLOGY - PAP
Adequacy: ABSENT
Diagnosis: NEGATIVE
HPV (WINDOPATH): DETECTED — AB

## 2018-06-01 ENCOUNTER — Encounter (INDEPENDENT_AMBULATORY_CARE_PROVIDER_SITE_OTHER): Payer: Self-pay | Admitting: Vascular Surgery

## 2018-06-01 ENCOUNTER — Ambulatory Visit (INDEPENDENT_AMBULATORY_CARE_PROVIDER_SITE_OTHER): Payer: 59 | Admitting: Vascular Surgery

## 2018-06-01 ENCOUNTER — Other Ambulatory Visit: Payer: Self-pay

## 2018-06-01 VITALS — BP 122/83 | HR 84 | Resp 10 | Ht 66.0 in | Wt 210.0 lb

## 2018-06-01 DIAGNOSIS — M7989 Other specified soft tissue disorders: Secondary | ICD-10-CM

## 2018-06-01 DIAGNOSIS — R600 Localized edema: Secondary | ICD-10-CM | POA: Diagnosis not present

## 2018-06-01 DIAGNOSIS — I10 Essential (primary) hypertension: Secondary | ICD-10-CM

## 2018-06-01 DIAGNOSIS — I871 Compression of vein: Secondary | ICD-10-CM | POA: Diagnosis not present

## 2018-06-01 NOTE — Patient Instructions (Signed)

## 2018-06-01 NOTE — Assessment & Plan Note (Signed)
Previous history of iliac vein stent placement.  Duplex to assess patency.

## 2018-06-01 NOTE — Assessment & Plan Note (Signed)
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  She has recurrent pain and swelling in her left leg.  I have recommended a venous reflux study be done in the near future at her convenience.  As she is having some symptoms in both legs, we can check the right leg as well.  We will also try to evaluate her pelvic veins and the patency of her stent.  If this cannot be determined, venogram may be necessary for further eval and treatment.  I suspect she has a component of lymphedema as well from chronic scarring and lymphatic channels.  I have written her a prescription for compression stockings and recommended she wear those daily.  We discussed the lymphedema pump may be an excellent adjuvant therapy as well.  Increasing activity and elevating her legs were also discussed.  We will see her back following her venous study.

## 2018-06-01 NOTE — Assessment & Plan Note (Signed)
blood pressure control important in reducing the progression of atherosclerotic disease. On appropriate oral medications.  

## 2018-06-01 NOTE — Progress Notes (Signed)
Patient ID: Kathleen [name], female   DOB: [birth year], 38 y.o.   MRN: [id number]  Chief Complaint  Patient presents with  . New Patient (Initial Visit)    HPI Kathleen [name] is a 38 y.o. female.  I am asked to see the patient by J. Cannaday NP for evaluation of leg [name] and swelling.  The patient reports her left lower extremity to be the predominantly affected leg.  This gets quite tight and painful and has become much more noticeable over the past several months.  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  Her symptoms worsened after her most recent pregnancy.  She has had 4 children.  She is now developing some mild swelling and [name] in her right leg as well.  She has not been regularly wearing the compression stocking.  She does not have any chest [name] or shortness of breath.  No ulceration or infection.  No fever or chills.  She does not have a strong family history of clotting problems or bleeding problems.     No past medical history on file.  Past Surgical History:  Procedure Laterality Date  . STENT PLACEMENT VASCULAR ([place] HX)    . TUBAL LIGATION      Family History Family History  Problem Relation Age of Onset  . Congestive Heart Failure Mother   . Irritable bowel syndrome Sister   No bleeding disorders, clotting disorders, or autoimmune diseases  Social History Social History   Tobacco Use  . Smoking status: Never Smoker  . Smokeless tobacco: Never Used  Substance Use Topics  . Alcohol use: Yes    Alcohol/week: 1.0 standard drinks    Types: 1 Glasses of wine per week  . Drug use: Not Currently    No Known Allergies  Current Outpatient Medications  Medication Sig Dispense Refill  . lisinopril (PRINIVIL,ZESTRIL) 10 MG tablet Take 1 tablet (10 mg total) by mouth daily. 90 tablet 3  . metroNIDAZOLE (FLAGYL) 500 MG tablet Take 1 tablet (500 mg total) by mouth 2 (two) times daily for  7 days. (Patient not taking: Reported on 06/01/2018) 14 tablet 0   No current facility-administered medications for this visit.       REVIEW OF SYSTEMS (Negative unless checked)  Constitutional: [] Weight loss  [] Fever  [] Chills Cardiac: [] Chest [name]   [] Chest pressure   [] Palpitations   [] Shortness of breath when laying flat   [] Shortness of breath at rest   [] Shortness of breath with exertion. 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   [] Phlebitis   [x] Swelling in legs   [] Varicose veins   [] Non-healing ulcers Pulmonary:   [] Uses home oxygen   [] Productive cough   [] Hemoptysis   [] Wheeze  [] COPD   [] Asthma Neurologic:  [] Dizziness  [] Blackouts   [] Seizures   [] History of stroke   [] History of TIA  [] Aphasia   [] Temporary blindness   [] Dysphagia   [] Weakness or numbness in arms   [] Weakness or numbness in legs Musculoskeletal:  [] Arthritis   [] Joint swelling   [] Joint [name]   [] Low back [name] Hematologic:  [] Easy bruising  [] Easy bleeding   [] Hypercoagulable state   []   Anemic  [] Hepatitis Gastrointestinal:  [] Blood in stool   [] Vomiting blood  [] Gastroesophageal reflux/heartburn   [] Abdominal [name] Genitourinary:  [] Chronic kidney disease   [] Difficult urination  [] Frequent urination  [] Burning with urination   [] Hematuria Skin:  [] Rashes   [] Ulcers   [] Wounds Psychological:  [] History of anxiety   []  History of major depression.    Physical Exam BP 122/83 (BP Location: Left Arm, Patient Position: Sitting, Cuff Size: Small)   Pulse 84   Resp 10   Ht 5\' 6"  (1.676 m)   Wt 210 lb (95.3 kg)   LMP 05/16/2018   BMI 33.89 kg/m  Gen:  WD/WN, NAD Head: [place]/AT, No temporalis wasting.  Ear/Nose/Throat: Hearing grossly intact, nares w/o erythema or drainage, oropharynx w/o Erythema/Exudate Eyes: Conjunctiva clear, sclera  non-icteric  Neck: trachea midline.  No JVD.  Pulmonary:  Good air movement, respirations not labored, no use of accessory muscles  Cardiac: RRR, no JVD Vascular:  Vessel Right Left  Radial Palpable Palpable                                   Musculoskeletal: M/S 5/5 throughout.  Extremities without ischemic changes.  No deformity or atrophy.  Trace right lower extremity edema, 1+ left lower extremity edema.  Feet are warm with good capillary refill.  Palpable pedal pulses Neurologic: Sensation grossly intact in extremities.  Symmetrical.  Speech is fluent. Motor exam as listed above. Psychiatric: Judgment intact, Mood & affect appropriate for pt's clinical situation. Dermatologic: No rashes or ulcers noted.  No cellulitis or open wounds.    Radiology No results found.  Labs Recent Results (from the past 2160 hour(s))  CBC with Differential/Platelet     Status: Abnormal   Collection Time: 03/24/18  9:38 AM  Result Value Ref Range   WBC 6.8 3.4 - 10.8 x10E3/uL   RBC 4.50 3.77 - 5.28 x10E6/uL   Hemoglobin 11.8 11.1 - 15.9 g/dL   Hematocrit 37.0 34.0 - 46.6 %   MCV 82 79 - 97 fL   MCH 26.2 (L) 26.6 - 33.0 pg   MCHC 31.9 31.5 - 35.7 g/dL   RDW 14.5 12.3 - 15.4 %    Comment: **Effective April 12, 2018, the RDW pediatric reference**   interval will be removed and the adult reference interval   will be changing to:                             Female 11.7 - 15.4                                                      Female 11.6 - 15.4    Platelets 295 150 - 450 x10E3/uL   Neutrophils 50 Not Estab. %   Lymphs 38 Not Estab. %   Monocytes 10 Not Estab. %   Eos 1 Not Estab. %   Basos 1 Not Estab. %   Neutrophils Absolute 3.4 1.4 - 7.0 x10E3/uL   Lymphocytes Absolute 2.6 0.7 - 3.1 x10E3/uL   Monocytes Absolute 0.7 0.1 - 0.9 x10E3/uL   EOS (ABSOLUTE) 0.1 0.0 - 0.4 x10E3/uL   Basophils Absolute 0.1 0.0 - 0.2 x10E3/uL   Immature Granulocytes 0 Not Estab. %  Immature Grans (Abs)  0.0 0.0 - 0.1 x10E3/uL  Comprehensive metabolic panel     Status: Abnormal   Collection Time: 03/24/18  9:38 AM  Result Value Ref Range   Glucose 91 65 - 99 mg/dL   BUN 7 6 - 20 mg/dL   Creatinine, Ser 0.85 0.57 - 1.00 mg/dL   GFR calc non Af Amer 88 >59 mL/min/1.73   GFR calc Af Amer 102 >59 mL/min/1.73   BUN/Creatinine Ratio 8 (L) 9 - 23   Sodium 140 134 - 144 mmol/L   Potassium 4.2 3.5 - 5.2 mmol/L   Chloride 104 96 - 106 mmol/L   CO2 23 20 - 29 mmol/L   Calcium 9.2 8.7 - 10.2 mg/dL   Total Protein 7.1 6.0 - 8.5 g/dL   Albumin 4.1 3.5 - 5.5 g/dL   Globulin, Total 3.0 1.5 - 4.5 g/dL   Albumin/Globulin Ratio 1.4 1.2 - 2.2   Bilirubin Total 0.9 0.0 - 1.2 mg/dL   Alkaline Phosphatase 59 39 - 117 IU/L   AST 12 0 - 40 IU/L   ALT 9 0 - 32 IU/L  Lipid Panel w/o Chol/HDL Ratio     Status: None   Collection Time: 03/24/18  9:38 AM  Result Value Ref Range   Cholesterol, Total 144 100 - 199 mg/dL   Triglycerides 42 0 - 149 mg/dL   HDL 60 >39 mg/dL   VLDL Cholesterol Cal 8 5 - 40 mg/dL   LDL Calculated 76 0 - 99 mg/dL  Thyroid Panel With TSH     Status: None   Collection Time: 03/24/18  9:38 AM  Result Value Ref Range   TSH 0.755 0.450 - 4.500 uIU/mL   T4, Total 8.6 4.5 - 12.0 ug/dL   T3 Uptake Ratio 29 24 - 39 %   Free Thyroxine Index 2.5 1.2 - 4.9  Urinalysis, Complete w Microscopic     Status: Abnormal   Collection Time: 04/11/18  1:56 AM  Result Value Ref Range   Color, Urine YELLOW (A) YELLOW   APPearance CLEAR (A) CLEAR   Specific Gravity, Urine 1.010 1.005 - 1.030   pH 6.0 5.0 - 8.0   Glucose, UA NEGATIVE NEGATIVE mg/dL   Hgb urine dipstick NEGATIVE NEGATIVE   Bilirubin Urine NEGATIVE NEGATIVE   Ketones, ur NEGATIVE NEGATIVE mg/dL   Protein, ur NEGATIVE NEGATIVE mg/dL   Nitrite NEGATIVE NEGATIVE   Leukocytes, UA NEGATIVE NEGATIVE   RBC / HPF 0-5 0 - 5 RBC/hpf   WBC, UA 0-5 0 - 5 WBC/hpf   Bacteria, UA NONE SEEN NONE SEEN   Squamous Epithelial / LPF 0-5 0 - 5    Mucus PRESENT     Comment: Performed at [facility], [address] [place], [place] [address]  Influenza panel by PCR (type A & B)     Status: Abnormal   Collection Time: 04/11/18  1:56 AM  Result Value Ref Range   Influenza A By PCR NEGATIVE NEGATIVE   Influenza B By PCR POSITIVE (A) NEGATIVE    Comment: (NOTE) The Xpert Xpress Flu assay is intended as an aid in the diagnosis of  influenza and should not be used as a sole basis for treatment.  This  assay is FDA approved for nasopharyngeal swab specimens only. Nasal  washings and aspirates are unacceptable for Xpert Xpress Flu testing. Performed at [facility], [address] [place],  [address]   Pregnancy, urine POC     Status:  None   Collection Time: 04/11/18  2:04 AM  Result Value Ref Range   Preg Test, Ur NEGATIVE NEGATIVE    Comment:        THE SENSITIVITY OF THIS METHODOLOGY IS >24 mIU/mL   Basic Metabolic Panel (BMET)     Status: None   Collection Time: 04/21/18 10:20 AM  Result Value Ref Range   Glucose 89 65 - 99 mg/dL   BUN 10 6 - 20 mg/dL   Creatinine, Ser 0.82 0.57 - 1.00 mg/dL   GFR calc non Af Amer 92 >59 mL/min/1.73   GFR calc Af Amer 106 >59 mL/min/1.73   BUN/Creatinine Ratio 12 9 - 23   Sodium 142 134 - 144 mmol/L   Potassium 4.2 3.5 - 5.2 mmol/L   Chloride 106 96 - 106 mmol/L   CO2 24 20 - 29 mmol/L   Calcium 8.8 8.7 - 10.2 mg/dL  Cytology - PAP     Status: Abnormal   Collection Time: 05/25/18 12:00 AM  Result Value Ref Range   Adequacy      Satisfactory for evaluation  endocervical/transformation zone component ABSENT.   Diagnosis      NEGATIVE FOR INTRAEPITHELIAL LESIONS OR MALIGNANCY.   HPV DETECTED (A)     Comment: Normal Reference Range - NOT Detected   Material Submitted CervicoVaginal Pap [ThinPrep Imaged]   WET PREP FOR TRICH, YEAST, CLUE     Status: Abnormal   Collection Time: 05/25/18 11:09 AM  Result Value Ref Range   Trichomonas Exam Negative Negative    Yeast Exam Positive (A) Negative   Clue Cell Exam Positive (A) Negative  HIV Antibody (routine testing w rflx)     Status: None   Collection Time: 05/25/18 11:29 AM  Result Value Ref Range   HIV Screen 4th Generation wRfx Non Reactive Non Reactive    Assessment/Plan:  Essential hypertension blood pressure control important in reducing the progression of atherosclerotic disease. On appropriate oral medications.   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  She has recurrent [name] and swelling in her left leg.  I have recommended a venous reflux study be done in the near future at her convenience.  As she is having some symptoms in both legs, we can check the right leg as well.  We will also try to evaluate her pelvic veins and the patency of her stent.  If this cannot be determined, venogram may be necessary for further eval and treatment.  I suspect she has a component of lymphedema as well from chronic scarring and lymphatic channels.  I have written her a prescription for compression stockings and recommended she wear those daily.  We discussed the lymphedema pump may be an excellent adjuvant therapy as well.  Increasing activity and elevating her legs were also discussed.  We will see her back following her venous study.  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  Duplex to assess patency.      Kathleen [name] 06/01/2018, 2:37 PM   This note was created with Dragon medical transcription system.  Any errors from dictation are unintentional.

## 2018-06-15 ENCOUNTER — Ambulatory Visit (INDEPENDENT_AMBULATORY_CARE_PROVIDER_SITE_OTHER): Payer: 59

## 2018-06-15 ENCOUNTER — Ambulatory Visit (INDEPENDENT_AMBULATORY_CARE_PROVIDER_SITE_OTHER): Payer: 59 | Admitting: Vascular Surgery

## 2018-06-15 ENCOUNTER — Encounter (INDEPENDENT_AMBULATORY_CARE_PROVIDER_SITE_OTHER): Payer: Self-pay | Admitting: Vascular Surgery

## 2018-06-15 VITALS — BP 120/84 | HR 79 | Resp 16 | Ht 66.0 in | Wt 209.0 lb

## 2018-06-15 DIAGNOSIS — I871 Compression of vein: Secondary | ICD-10-CM | POA: Diagnosis not present

## 2018-06-15 DIAGNOSIS — Z79899 Other long term (current) drug therapy: Secondary | ICD-10-CM

## 2018-06-15 DIAGNOSIS — R600 Localized edema: Secondary | ICD-10-CM | POA: Diagnosis not present

## 2018-06-15 DIAGNOSIS — I10 Essential (primary) hypertension: Secondary | ICD-10-CM

## 2018-06-15 DIAGNOSIS — M7989 Other specified soft tissue disorders: Secondary | ICD-10-CM

## 2018-06-15 NOTE — Assessment & Plan Note (Signed)
Duplex today shows that her left iliac vein stent is patent without any significant stenosis that we can identify by duplex.  There is no thrombosis with good flow through both iliac veins and the IVC.

## 2018-06-15 NOTE — Progress Notes (Signed)
MRN : [id number]  Kathleen [name] is a 38 y.o. ([birth year]) female who presents with chief complaint of  Chief Complaint  Patient presents with  . Follow-up    ultrasound follow up  .  History of Present Illness: Patient returns today in follow up of venous insufficiency and leg swelling.  Her symptoms are about the same. No major changes.  She is wearing her compression stockings and elevating her legs which has reasonably well controlled her symptoms although not entirely.  Duplex today shows that her left iliac vein stent is patent without any significant stenosis that we can identify by duplex.  There is no thrombosis with good flow through both iliac veins and the IVC.  We also performed reflux study which showed reflux at the saphenofemoral junction on the left leg in the proximal saphenous vein but not the remainder of the great saphenous vein or the deep venous system.  On the right, no reflux was identified.  Bilateral Baker's cyst were identified in the popliteal space.  Current Outpatient Medications  Medication Sig Dispense Refill  . lisinopril (PRINIVIL,ZESTRIL) 10 MG tablet Take 1 tablet (10 mg total) by mouth daily. 90 tablet 3   No current facility-administered medications for this visit.     No past medical history on file.  Past Surgical History:  Procedure Laterality Date  . STENT PLACEMENT VASCULAR ([place] HX)    . TUBAL LIGATION      Social History Social History   Tobacco Use  . Smoking status: Never Smoker  . Smokeless tobacco: Never Used  Substance Use Topics  . Alcohol use: Yes    Alcohol/week: 1.0 standard drinks    Types: 1 Glasses of wine per week  . Drug use: Not Currently     Family History Family History  Problem Relation Age of Onset  . Congestive Heart Failure Mother   . Irritable bowel syndrome Sister   no bleeding or clotting disorders  No Known Allergies    REVIEW OF SYSTEMS (Negative unless checked)  Constitutional:  [] ?Weight loss  [] ?Fever  [] ?Chills Cardiac: [] ?Chest pain   [] ?Chest pressure   [] ?Palpitations   [] ?Shortness of breath when laying flat   [] ?Shortness of breath at rest   [] ?Shortness of breath with exertion. Vascular:  [x] ?Pain in legs with walking   [] ?Pain in legs at rest   [] ?Pain in legs when laying flat   [] ?Claudication   [] ?Pain in feet when walking  [] ?Pain in feet at rest  [] ?Pain in feet when laying flat   [x] ?History of DVT   [] ?Phlebitis   [x] ?Swelling in legs   [] ?Varicose veins   [] ?Non-healing ulcers Pulmonary:   [] ?Uses home oxygen   [] ?Productive cough   [] ?Hemoptysis   [] ?Wheeze  [] ?COPD   [] ?Asthma Neurologic:  [] ?Dizziness  [] ?Blackouts   [] ?Seizures   [] ?History of stroke   [] ?History of TIA  [] ?Aphasia   [] ?Temporary blindness   [] ?Dysphagia   [] ?Weakness or numbness in arms   [] ?Weakness or numbness in legs Musculoskeletal:  [] ?Arthritis   [] ?Joint swelling   [] ?Joint pain   [] ?Low back pain Hematologic:  [] ?Easy bruising  [] ?Easy bleeding   [] ?Hypercoagulable state   [] ?Anemic  [] ?Hepatitis Gastrointestinal:  [] ?Blood in stool   [] ?Vomiting blood  [] ?Gastroesophageal reflux/heartburn   [] ?Abdominal pain Genitourinary:  [] ?Chronic kidney disease   [] ?Difficult urination  [] ?Frequent urination  [] ?Burning with urination   [] ?Hematuria Skin:  [] ?Rashes   [] ?Ulcers   [] ?Wounds Psychological:  [] ?History of anxiety   [] ?  History of major depression.   Physical Examination  BP 120/84 (BP Location: Left Arm)   Pulse 79   Resp 16   Ht 5\' 6"  (1.676 m)   Wt 209 lb (94.8 kg)   LMP 05/16/2018   BMI 33.73 kg/m  Gen:  WD/WN, NAD Head: [place]/AT, No temporalis wasting. Ear/Nose/Throat: Hearing grossly intact, nares w/o erythema or drainage Eyes: Conjunctiva clear. Sclera non-icteric Neck: Supple.  Trachea midline Pulmonary:  Good air movement, no use of accessory muscles.  Cardiac: RRR, no JVD Vascular:  Vessel Right Left  Radial Palpable Palpable                            Musculoskeletal: M/S 5/5 throughout.  No deformity or atrophy. Trace LE edema. Neurologic: Sensation grossly intact in extremities.  Symmetrical.  Speech is fluent.  Psychiatric: Judgment intact, Mood & affect appropriate for pt's clinical situation. Dermatologic: No rashes or ulcers noted.  No cellulitis or open wounds.       Labs Recent Results (from the past 2160 hour(s))  CBC with Differential/Platelet     Status: Abnormal   Collection Time: 03/24/18  9:38 AM  Result Value Ref Range   WBC 6.8 3.4 - 10.8 x10E3/uL   RBC 4.50 3.77 - 5.28 x10E6/uL   Hemoglobin 11.8 11.1 - 15.9 g/dL   Hematocrit 37.0 34.0 - 46.6 %   MCV 82 79 - 97 fL   MCH 26.2 (L) 26.6 - 33.0 pg   MCHC 31.9 31.5 - 35.7 g/dL   RDW 14.5 12.3 - 15.4 %    Comment: **Effective April 12, 2018, the RDW pediatric reference**   interval will be removed and the adult reference interval   will be changing to:                             Female 11.7 - 15.4                                                      Female 11.6 - 15.4    Platelets 295 150 - 450 x10E3/uL   Neutrophils 50 Not Estab. %   Lymphs 38 Not Estab. %   Monocytes 10 Not Estab. %   Eos 1 Not Estab. %   Basos 1 Not Estab. %   Neutrophils Absolute 3.4 1.4 - 7.0 x10E3/uL   Lymphocytes Absolute 2.6 0.7 - 3.1 x10E3/uL   Monocytes Absolute 0.7 0.1 - 0.9 x10E3/uL   EOS (ABSOLUTE) 0.1 0.0 - 0.4 x10E3/uL   Basophils Absolute 0.1 0.0 - 0.2 x10E3/uL   Immature Granulocytes 0 Not Estab. %   Immature Grans (Abs) 0.0 0.0 - 0.1 x10E3/uL  Comprehensive metabolic panel     Status: Abnormal   Collection Time: 03/24/18  9:38 AM  Result Value Ref Range   Glucose 91 65 - 99 mg/dL   BUN 7 6 - 20 mg/dL   Creatinine, Ser 0.85 0.57 - 1.00 mg/dL   GFR calc non Af Amer 88 >59 mL/min/1.73   GFR calc Af Amer 102 >59 mL/min/1.73   BUN/Creatinine Ratio 8 (L) 9 - 23   Sodium 140 134 - 144 mmol/L   Potassium 4.2 3.5 - 5.2 mmol/L   Chloride 104 96 - 106  mmol/L   CO2 23 20 -  29 mmol/L   Calcium 9.2 8.7 - 10.2 mg/dL   Total Protein 7.1 6.0 - 8.5 g/dL   Albumin 4.1 3.5 - 5.5 g/dL   Globulin, Total 3.0 1.5 - 4.5 g/dL   Albumin/Globulin Ratio 1.4 1.2 - 2.2   Bilirubin Total 0.9 0.0 - 1.2 mg/dL   Alkaline Phosphatase 59 39 - 117 IU/L   AST 12 0 - 40 IU/L   ALT 9 0 - 32 IU/L  Lipid Panel w/o Chol/HDL Ratio     Status: None   Collection Time: 03/24/18  9:38 AM  Result Value Ref Range   Cholesterol, Total 144 100 - 199 mg/dL   Triglycerides 42 0 - 149 mg/dL   HDL 60 >39 mg/dL   VLDL Cholesterol Cal 8 5 - 40 mg/dL   LDL Calculated 76 0 - 99 mg/dL  Thyroid Panel With TSH     Status: None   Collection Time: 03/24/18  9:38 AM  Result Value Ref Range   TSH 0.755 0.450 - 4.500 uIU/mL   T4, Total 8.6 4.5 - 12.0 ug/dL   T3 Uptake Ratio 29 24 - 39 %   Free Thyroxine Index 2.5 1.2 - 4.9  Urinalysis, Complete w Microscopic     Status: Abnormal   Collection Time: 04/11/18  1:56 AM  Result Value Ref Range   Color, Urine YELLOW (A) YELLOW   APPearance CLEAR (A) CLEAR   Specific Gravity, Urine 1.010 1.005 - 1.030   pH 6.0 5.0 - 8.0   Glucose, UA NEGATIVE NEGATIVE mg/dL   Hgb urine dipstick NEGATIVE NEGATIVE   Bilirubin Urine NEGATIVE NEGATIVE   Ketones, ur NEGATIVE NEGATIVE mg/dL   Protein, ur NEGATIVE NEGATIVE mg/dL   Nitrite NEGATIVE NEGATIVE   Leukocytes, UA NEGATIVE NEGATIVE   RBC / HPF 0-5 0 - 5 RBC/hpf   WBC, UA 0-5 0 - 5 WBC/hpf   Bacteria, UA NONE SEEN NONE SEEN   Squamous Epithelial / LPF 0-5 0 - 5   Mucus PRESENT     Comment: Performed at [facility], [address] [place], [place] [address]  Influenza panel by PCR (type A & B)     Status: Abnormal   Collection Time: 04/11/18  1:56 AM  Result Value Ref Range   Influenza A By PCR NEGATIVE NEGATIVE   Influenza B By PCR POSITIVE (A) NEGATIVE    Comment: (NOTE) The Xpert Xpress Flu assay is intended as an aid in the diagnosis of  influenza and should not be used as a sole basis for  treatment.  This  assay is FDA approved for nasopharyngeal swab specimens only. Nasal  washings and aspirates are unacceptable for Xpert Xpress Flu testing. Performed at [facility], [address] [place], [place] [address]   Pregnancy, urine POC     Status: None   Collection Time: 04/11/18  2:04 AM  Result Value Ref Range   Preg Test, Ur NEGATIVE NEGATIVE    Comment:        THE SENSITIVITY OF THIS METHODOLOGY IS >24 mIU/mL   Basic Metabolic Panel (BMET)     Status: None   Collection Time: 04/21/18 10:20 AM  Result Value Ref Range   Glucose 89 65 - 99 mg/dL   BUN 10 6 - 20 mg/dL   Creatinine, Ser 0.82 0.57 - 1.00 mg/dL   GFR calc non Af Amer 92 >59 mL/min/1.73   GFR calc Af Amer 106 >59  mL/min/1.73   BUN/Creatinine Ratio 12 9 - 23   Sodium 142 134 - 144 mmol/L   Potassium 4.2 3.5 - 5.2 mmol/L   Chloride 106 96 - 106 mmol/L   CO2 24 20 - 29 mmol/L   Calcium 8.8 8.7 - 10.2 mg/dL  Cytology - PAP     Status: Abnormal   Collection Time: 05/25/18 12:00 AM  Result Value Ref Range   Adequacy      Satisfactory for evaluation  endocervical/transformation zone component ABSENT.   Diagnosis      NEGATIVE FOR INTRAEPITHELIAL LESIONS OR MALIGNANCY.   HPV DETECTED (A)     Comment: Normal Reference Range - NOT Detected   Material Submitted CervicoVaginal Pap [ThinPrep Imaged]   WET PREP FOR TRICH, YEAST, CLUE     Status: Abnormal   Collection Time: 05/25/18 11:09 AM  Result Value Ref Range   Trichomonas Exam Negative Negative   Yeast Exam Positive (A) Negative   Clue Cell Exam Positive (A) Negative  HIV Antibody (routine testing w rflx)     Status: None   Collection Time: 05/25/18 11:29 AM  Result Value Ref Range   HIV Screen 4th Generation wRfx Non Reactive Non Reactive    Radiology No results found.  Assessment/Plan  Essential hypertension blood pressure control important in reducing the progression of atherosclerotic disease. On appropriate oral  medications.   May-Thurner syndrome Duplex today shows that her left iliac vein stent is patent without any significant stenosis that we can identify by duplex.  There is no thrombosis with good flow through both iliac veins and the IVC.  Left leg swelling Duplex today shows that her left iliac vein stent is patent without any significant stenosis that we can identify by duplex.  There is no thrombosis with good flow through both iliac veins and the IVC.  We also performed reflux study which showed reflux at the saphenofemoral junction on the left leg in the proximal saphenous vein but not the remainder of the great saphenous vein or the deep venous system.  On the right, no reflux was identified.  Bilateral Baker's cyst were identified in the popliteal space. At this point, no role for vascular intervention or surgery that would be of benefit.  Would recommend continued wearing compression stockings and elevating her legs.  I will plan to see her back in about 6 months in follow-up to reassess the small amount of reflux that she currently has in the left leg.    Leotis Pain, MD  06/15/2018 10:00 AM    This note was created with Dragon medical transcription system.  Any errors from dictation are purely unintentional

## 2018-06-15 NOTE — Patient Instructions (Signed)

## 2018-06-15 NOTE — Assessment & Plan Note (Signed)
blood pressure control important in reducing the progression of atherosclerotic disease. On appropriate oral medications.  

## 2018-06-15 NOTE — Assessment & Plan Note (Signed)
Duplex today shows that her left iliac vein stent is patent without any significant stenosis that we can identify by duplex.  There is no thrombosis with good flow through both iliac veins and the IVC.  We also performed reflux study which showed reflux at the saphenofemoral junction on the left leg in the proximal saphenous vein but not the remainder of the great saphenous vein or the deep venous system.  On the right, no reflux was identified.  Bilateral Baker's cyst were identified in the popliteal space. At this point, no role for vascular intervention or surgery that would be of benefit.  Would recommend continued wearing compression stockings and elevating her legs.  I will plan to see her back in about 6 months in follow-up to reassess the small amount of reflux that she currently has in the left leg.

## 2018-08-04 ENCOUNTER — Other Ambulatory Visit: Payer: Self-pay

## 2018-08-04 ENCOUNTER — Encounter: Payer: Self-pay | Admitting: Emergency Medicine

## 2018-08-04 ENCOUNTER — Emergency Department
Admission: EM | Admit: 2018-08-04 | Discharge: 2018-08-04 | Disposition: A | Discharge status: Home / Self Care | Payer: 59 | Attending: Emergency Medicine | Admitting: Emergency Medicine

## 2018-08-04 DIAGNOSIS — Z79899 Other long term (current) drug therapy: Secondary | ICD-10-CM | POA: Insufficient documentation

## 2018-08-04 DIAGNOSIS — R55 Syncope and collapse: Secondary | ICD-10-CM | POA: Insufficient documentation

## 2018-08-04 DIAGNOSIS — T7840XA Allergy, unspecified, initial encounter: Secondary | ICD-10-CM | POA: Diagnosis not present

## 2018-08-04 DIAGNOSIS — L509 Urticaria, unspecified: Secondary | ICD-10-CM | POA: Diagnosis present

## 2018-08-04 DIAGNOSIS — I10 Essential (primary) hypertension: Secondary | ICD-10-CM | POA: Diagnosis not present

## 2018-08-04 MED ORDER — METHYLPREDNISOLONE SODIUM SUCC 125 MG IJ SOLR
125.0000 mg | Freq: Once | INTRAMUSCULAR | Status: AC
  Administered 2018-08-04: 01:00:00 125 mg via INTRAVENOUS
  Filled 2018-08-04: qty 2

## 2018-08-04 MED ORDER — FAMOTIDINE IN NACL 20-0.9 MG/50ML-% IV SOLN
20.0000 mg | Freq: Once | INTRAVENOUS | Status: AC
  Administered 2018-08-04: 20 mg via INTRAVENOUS
  Filled 2018-08-04: qty 50

## 2018-08-04 MED ORDER — PREDNISONE 20 MG PO TABS: 60.0000 mg | ORAL_TABLET | Freq: Every day | ORAL | 0 refills | Status: DC

## 2018-08-04 MED ORDER — EPINEPHRINE 0.3 MG/0.3ML IJ SOAJ
0.3000 mg | Freq: Once | INTRAMUSCULAR | Status: DC | PRN
  Filled 2018-08-04: qty 0.3

## 2018-08-04 MED ORDER — EPINEPHRINE 0.3 MG/0.3ML IJ SOAJ: 0.3000 mg | Freq: Once | INTRAMUSCULAR | 0 refills | Status: AC

## 2018-08-04 MED ORDER — DIPHENHYDRAMINE HCL 50 MG/ML IJ SOLN
25.0000 mg | INTRAMUSCULAR | Status: AC
  Administered 2018-08-04: 01:00:00 25 mg via INTRAVENOUS
  Filled 2018-08-04: qty 1

## 2018-08-04 NOTE — ED Notes (Signed)
Report off to jenn I rn

## 2018-08-04 NOTE — ED Notes (Signed)
nsr on monitor.  Pt alert.  No diff swallowing.  No resp distress.  Pt on cell phone.

## 2018-08-04 NOTE — Discharge Instructions (Signed)

## 2018-08-04 NOTE — ED Provider Notes (Signed)
[facility] Emergency Department Provider Note  ____________________________________________   First MD Initiated Contact with Patient 08/04/18 [phone]     (approximate)  I have reviewed the triage vital signs and the nursing notes.   HISTORY  Chief Complaint Allergic Reaction and Urticaria  Level 5 caveat:  history/ROS limited by acute/critical illness  HPI Kathleen [name] is a 38 y.o. female with medical history as listed below who presents for evaluation of acute onset and severe generalized itching rash.  She states that she was at work and she noticed that she developed some hives on 1 of her arms.  Within a matter of minutes they were generalized all over her body and were very itchy.  She came to the emergency department for evaluation and while she was in triage she had a couple of episodes of starting to fall forward as though she was about to lose consciousness.  She was brought back immediately to a room and I was alerted and saw her immediately.  She was alert and oriented when I saw her with some mild tachycardia but a normal blood pressure.  She reports no lightheadedness or dizziness.  She denies fever/chills, sore throat, cough, chest pain, shortness of breath, nausea, vomiting, abdominal pain, and dysuria.  She does not feel like she is going to pass out.  She has had a couple of similar episodes of an apparent allergic reaction but she does not know to what she is allergic.  She has no new medications and only takes lisinopril which she has been taking for a long time.  She has no swelling of the tongue or the mouth and has no difficulty swallowing.  She has no lesions inside her mouth.  Nothing in particular makes his symptoms better or worse and they are acute in onset, rapidly spreading, and severe.         History reviewed. No pertinent past medical history.  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 Authorizing Provider  EPINEPHrine (EPIPEN 2-PAK) 0.3 mg/0.3 mL IJ SOAJ injection Inject 0.3 mLs (0.3 mg total) into the muscle once for 1 dose. Take for severe allergic reaction, then come immediately to the Emergency Department or call 911. 08/04/18 08/04/18  Hinda Kehr, MD  lisinopril (PRINIVIL,ZESTRIL) 10 MG tablet Take 1 tablet (10 mg total) by mouth daily. 03/24/18   Cannady, Henrine Screws T, NP  predniSONE (DELTASONE) 20 MG tablet Take 3 tablets (60 mg total) by mouth daily. 08/04/18   Hinda Kehr, MD    Allergies Patient has no known allergies.  Family History  Problem Relation Age of Onset   Congestive Heart Failure Mother    Irritable bowel syndrome Sister     Social History Social History   Tobacco Use   Smoking status: Never Smoker   Smokeless tobacco: Never Used  Substance Use Topics  Alcohol use: Yes    Alcohol/week: 1.0 standard drinks    Types: 1 Glasses of wine per week   Drug use: Not Currently    Review of Systems Constitutional: No fever/chills Eyes: No visual changes. ENT: No sore throat. Cardiovascular: Denies chest pain. Respiratory: Denies shortness of breath. Gastrointestinal: No abdominal pain.  No nausea, no vomiting.  No diarrhea.  No constipation. Genitourinary: Negative for dysuria. Musculoskeletal: Negative for neck pain.  Negative for back  pain. Integumentary: Generalized raised itching red rash as described above. Neurological: Negative for headaches, focal weakness or numbness.   ____________________________________________   PHYSICAL EXAM:  VITAL SIGNS: ED Triage Vitals  Enc Vitals Group     BP 08/04/18 0038 125/85     Pulse Rate 08/04/18 0038 (!) 116     Resp 08/04/18 0038 18     Temp 08/04/18 0038 97.8 F (36.6 C)     Temp Source 08/04/18 0038 Oral     SpO2 08/04/18 0038 98 %     Weight 08/04/18 0040 82.1 kg (181 lb)     Height 08/04/18 0040 1.676 m (5\' 6" )     Head Circumference --      Peak Flow --      Pain Score 08/04/18 0040 0     Pain Loc --      Pain Edu? --      Excl. in [place]? --     Constitutional: Alert and oriented. Well appearing and in no acute distress. Eyes: Conjunctivae are normal.  Head: Atraumatic. Nose: No congestion/rhinnorhea. Mouth/Throat: Mucous membranes are moist.  No oral lesions. Neck: No stridor.  No meningeal signs.   Cardiovascular: Normal rate, regular rhythm. Good peripheral circulation. Grossly normal heart sounds. Respiratory: Normal respiratory effort.  No retractions. No audible wheezing. Gastrointestinal: Soft and nontender. No distention.  Musculoskeletal: No lower extremity tenderness nor edema. No gross deformities of extremities. Neurologic:  Normal speech and language. No gross focal neurologic deficits are appreciated.  Skin:  Skin is warm, dry and intact.  Generalized raised itching rash all over her extremities and torso consistent with urticaria.  No bullae, no blisters.  Nontender. Psychiatric: Mood and affect are normal. Speech and behavior are normal.  ____________________________________________   LABS (all labs ordered are listed, but only abnormal results are displayed)  Labs Reviewed - No data to display ____________________________________________  EKG  No indication for EKG ____________________________________________  RADIOLOGY   ED  MD interpretation: No indication for imaging  Official radiology report(s): No results found.  ____________________________________________   PROCEDURES   Procedure(s) performed (including Critical Care):  Procedures   ____________________________________________   INITIAL IMPRESSION / MDM / [place] / ED COURSE  As part of my medical decision making, I reviewed the following data within the [place] notes reviewed and incorporated, Old chart reviewed and Notes from prior ED visits      Kathleen [name] was evaluated in Emergency Department on 08/04/2018 for the symptoms described in the history of present illness. She was evaluated in the context of the global COVID-19 pandemic, which necessitated consideration that the patient might be at risk for infection with the SARS-CoV-2 virus that causes COVID-19. Institutional protocols and algorithms that pertain to the evaluation of patients at risk for COVID-19 are in a state of rapid change based on information released by regulatory bodies including the CDC and federal and state organizations. These policies and algorithms were followed during the patient's care in the ED.  Differential diagnosis includes, but is not limited to, anaphylaxis, generalized allergic reaction, medication side effect, drug reaction.  The patient is generally well-appearing and in no distress.  I am not certain what kind of episode she had in triage but her blood pressure is normal and she does not feel lightheaded or dizzy.  She does not meet criteria for anaphylaxis at this time but does appear that she needs urgent intervention.  I am putting an EpiPen in the room but holding off on administration given no clinical signs of anaphylaxis.  She is getting famotidine 20 mg IV, Benadryl 25 mg IV, and Solu-Medrol 125 mg IV.  She is on the cardiac monitor and pulse oximeter and I explained it would be observing her for a number of  hours to make sure she does not get worse.  She states she understands and agrees with the plan.  No sign of oral lesions, no sign of Stevens-Johnson syndrome, no sign of medication side effect at this time.  No evidence of infectious process and no concern for COVID-19.  Clinical Course as of Aug 04 602  Wed Aug 04, 2018  0135 Patient feels better.  Rash almost resolved, still some residual rash on arms, but resolved from torso. No SOB. Will continue to monitor.   [CF]  [id number] Patient remains stable and comfortable.   [CF]  [phone] The patient has been observed for more than 4 hours and is feeling well.  Her rash is resolved.  I will give her a course of prednisone and a prescription for an EpiPen with my usual recommendations and precautions.  She will follow-up with her regular doctor.   [CF]    Clinical Course User Index [CF] Hinda Kehr, MD     ____________________________________________  FINAL CLINICAL IMPRESSION(S) / ED DIAGNOSES  Final diagnoses:  Allergic reaction, initial encounter     MEDICATIONS GIVEN DURING THIS VISIT:  Medications  EPINEPHrine (EPI-PEN) injection 0.3 mg (has no administration in time range)  methylPREDNISolone sodium succinate (SOLU-MEDROL) 125 mg/2 mL injection 125 mg (125 mg Intravenous Given 08/04/18 0047)  famotidine (PEPCID) IVPB 20 mg premix (0 mg Intravenous Stopped 08/04/18 0113)  diphenhydrAMINE (BENADRYL) injection 25 mg (25 mg Intravenous Given 08/04/18 0047)     ED Discharge Orders         Ordered    predniSONE (DELTASONE) 20 MG tablet  Daily     08/04/18 0451    EPINEPHrine (EPIPEN 2-PAK) 0.3 mg/0.3 mL IJ SOAJ injection   Once     08/04/18 0451           Note:  This document was prepared using Dragon voice recognition software and may include unintentional dictation errors.   Hinda Kehr, MD 08/04/18 [phone]

## 2018-08-04 NOTE — ED Notes (Signed)
ED Provider at bedside. 

## 2018-08-04 NOTE — ED Triage Notes (Signed)
Pt arrived to the ED via POV for complaints of a generalized rash. As the Pt was checking in she felt forward and was confused. Pt was taken to a room and MD was at bedside.

## 2018-09-01 ENCOUNTER — Other Ambulatory Visit: Payer: Self-pay

## 2018-09-01 ENCOUNTER — Ambulatory Visit: Payer: 59 | Admitting: Nurse Practitioner

## 2018-09-01 ENCOUNTER — Encounter: Payer: Self-pay | Admitting: Nurse Practitioner

## 2018-09-01 VITALS — BP 130/97 | HR 90 | Temp 98.8°F

## 2018-09-01 DIAGNOSIS — L239 Allergic contact dermatitis, unspecified cause: Secondary | ICD-10-CM | POA: Diagnosis not present

## 2018-09-01 DIAGNOSIS — L509 Urticaria, unspecified: Secondary | ICD-10-CM | POA: Diagnosis not present

## 2018-09-01 MED ORDER — CLOBETASOL PROP EMOLLIENT BASE 0.05 % EX CREA: 1.0000 "application " | TOPICAL_CREAM | Freq: Two times a day (BID) | CUTANEOUS | 0 refills | Status: DC

## 2018-09-01 MED ORDER — PREDNISONE 10 MG PO TABS: ORAL_TABLET | ORAL | 0 refills | Status: DC

## 2018-09-01 NOTE — Progress Notes (Signed)
BP (!) 130/97   Pulse 90   Temp 98.8 F (37.1 C) (Oral)   SpO2 99%    Subjective:    Patient ID: Kathleen [name], female    DOB: 11-Feb-1981, 38 y.o.   MRN: [id number]  HPI: Kathleen [name] is a 38 y.o. female  Chief Complaint  Patient presents with  . Urticaria    pt states she has had hives all over her torso for the past week, states she does not know what they are from but that they itch really bad   RASH Started One A Day Vitamins two weeks ago, stopped taking these a few days ago.  A couple weeks ago she had an outbreak of rash at work to face/upper extremities/legs.  Was seen in ER 08/04/2018 for rash and allergic reaction, reports this as a "major reaction".  During that event she felt like throat would close. Was given Famotidine, Benadryl, and Solu Medrol IV in ER, then was given Prednisone to take at home and she did not take this.  Was also supplied Epi pen, which she is now carrying with her.  She reports she had not been taking the new One A Day vitamin prior to ER event.  The only other new item she has used in past weeks is new bras, which she reports she has washed.  Current episode of hives is to upper body, she reports that hives have been here on/off and she has had no further episodes like ER event.  She reports no history of allergy testing, but her sister has had it done and is allergic to "everything".  Did have recent stressor over past few months with loss of her mother. Duration:  weeks  Location: generalized  Itching: yes Burning: no Redness: no Oozing: no Scaling: no Blisters: no Painful: no Fevers: no Change in detergents/soaps/personal care products: yes, has been using Gain for years Recent illness: no Recent travel:no History of same: no Context: fluctuating Alleviating factors: none Treatments attempted:none, but has Benadryl at home Shortness of breath: no  Throat/tongue swelling: no Myalgias/arthralgias: no  Relevant past medical,  surgical, family and social history reviewed and updated as indicated. Interim medical history since our last visit reviewed. Allergies and medications reviewed and updated.  Review of Systems  Constitutional: Negative for activity change, appetite change, diaphoresis, fatigue and fever.  Respiratory: Negative for cough, chest tightness and shortness of breath.   Cardiovascular: Negative for chest pain, palpitations and leg swelling.  Gastrointestinal: Negative for abdominal distention, abdominal pain, constipation, diarrhea, nausea and vomiting.  Skin: Positive for rash.  Neurological: Negative for dizziness, syncope, weakness, light-headedness, numbness and headaches.  Psychiatric/Behavioral: Negative.     Per HPI unless specifically indicated above     Objective:    BP (!) 130/97   Pulse 90   Temp 98.8 F (37.1 C) (Oral)   SpO2 99%   Wt Readings from Last 3 Encounters:  08/04/18 181 lb (82.1 kg)  06/15/18 209 lb (94.8 kg)  06/01/18 210 lb (95.3 kg)    Physical Exam Vitals signs and nursing note reviewed.  Constitutional:      General: She is awake. She is not in acute distress.    Appearance: She is well-developed. She is not ill-appearing.  HENT:     Head: Normocephalic.     Right Ear: Hearing normal.     Left Ear: Hearing normal.     Nose: Nose normal.     Mouth/Throat:  Mouth: Mucous membranes are moist.  Eyes:     General: Lids are normal.        Right eye: No discharge.        Left eye: No discharge.     Conjunctiva/sclera: Conjunctivae normal.     Pupils: Pupils are equal, round, and reactive to light.  Neck:     Musculoskeletal: Normal range of motion and neck supple.     Thyroid: No thyromegaly.     Vascular: No carotid bruit.  Cardiovascular:     Rate and Rhythm: Normal rate and regular rhythm.     Heart sounds: Normal heart sounds. No murmur. No gallop.   Pulmonary:     Effort: Pulmonary effort is normal. No accessory muscle usage or respiratory  distress.     Breath sounds: Normal breath sounds.  Abdominal:     General: Bowel sounds are normal.     Palpations: Abdomen is soft. There is no hepatomegaly or splenomegaly.  Musculoskeletal:     Right lower leg: No edema.     Left lower leg: No edema.  Lymphadenopathy:     Cervical: No cervical adenopathy.  Skin:    General: Skin is warm and dry.     Findings: Rash present. Rash is macular and papular.     Comments: Varied between flat and slightly raised macupapular rash scattered across chest area and under breasts.  Individual areas, no confluence.  No vesicles.  Areas with mild erythema and no warmth/tenderness. No rash to back, arms, or legs.  Neurological:     Mental Status: She is alert and oriented to person, place, and time.  Psychiatric:        Attention and Perception: Attention normal.        Mood and Affect: Mood normal.        Behavior: Behavior normal. Behavior is cooperative.        Thought Content: Thought content normal.        Judgment: Judgment normal.     Results for orders placed or performed in visit on 05/25/18  WET PREP FOR [place], YEAST, CLUE  Result Value Ref Range   Trichomonas Exam Negative Negative   Yeast Exam Positive (A) Negative   Clue Cell Exam Positive (A) Negative  HIV Antibody (routine testing w rflx)  Result Value Ref Range   HIV Screen 4th Generation wRfx Non Reactive Non Reactive  Cytology - PAP  Result Value Ref Range   Adequacy      Satisfactory for evaluation  endocervical/transformation zone component ABSENT.   Diagnosis      NEGATIVE FOR INTRAEPITHELIAL LESIONS OR MALIGNANCY.   HPV DETECTED (A)    Material Submitted CervicoVaginal Pap [ThinPrep Imaged]       Assessment & Plan:   Problem List Items Addressed This Visit      Musculoskeletal and Integument   Allergic dermatitis    Unknown cause, only new items have been bras and vitamin.  Have advised to return to old bras and she has stopped taking vitamins.  Script for  Prednisone and Clobetasol cream sent.  Recommend use of daily Zyrtec or Claritin for pruritus or may take Benadryl as needed.  Calamine lotion as needed and oatmeal baths.  Referral for allergy testing.  Return for worsening or continued symptoms.       Other Visit Diagnoses    Hives    -  Primary   Relevant Orders   Ambulatory referral to Allergy  Follow up plan: Return if symptoms worsen or fail to improve.

## 2018-09-01 NOTE — Patient Instructions (Signed)
May take Benadryl, Claritin, or Zyrtec per instructions on bottle as needed  Contact Dermatitis Dermatitis is redness, soreness, and swelling (inflammation) of the skin. Contact dermatitis is a reaction to something that touches the skin. There are two types of contact dermatitis:  Irritant contact dermatitis. This happens when something bothers (irritates) your skin, like soap.  Allergic contact dermatitis. This is caused when you are exposed to something that you are allergic to, such as poison ivy. What are the causes?  Common causes of irritant contact dermatitis include: ? Makeup. ? Soaps. ? Detergents. ? Bleaches. ? Acids. ? Metals, such as nickel.  Common causes of allergic contact dermatitis include: ? Plants. ? Chemicals. ? Jewelry. ? Latex. ? Medicines. ? Preservatives in products, such as clothing. What increases the risk?  Having a job that exposes you to things that bother your skin.  Having asthma or eczema. What are the signs or symptoms? Symptoms may happen anywhere the irritant has touched your skin. Symptoms include:  Dry or flaky skin.  Redness.  Cracks.  Itching.  Pain or a burning feeling.  Blisters.  Blood or clear fluid draining from skin cracks. With allergic contact dermatitis, swelling may occur. This may happen in places such as the eyelids, mouth, or genitals. How is this treated?  This condition is treated by checking for the cause of the reaction and protecting your skin. Treatment may also include: ? Steroid creams, ointments, or medicines. ? Antibiotic medicines or other ointments, if you have a skin infection. ? Lotion or medicines to help with itching. ? A bandage (dressing). Follow these instructions at home: Skin care  Moisturize your skin as needed.  Put cool cloths on your skin.  Put a baking soda paste on your skin. Stir water into baking soda until it looks like a paste.  Do not scratch your skin.  Avoid having  things rub up against your skin.  Avoid the use of soaps, perfumes, and dyes. Medicines  Take or apply over-the-counter and prescription medicines only as told by your doctor.  If you were prescribed an antibiotic medicine, take or apply it as told by your doctor. Do not stop using it even if your condition starts to get better. Bathing  Take a bath with: ? Epsom salts. ? Baking soda. ? Colloidal oatmeal.  Bathe less often.  Bathe in warm water. Avoid using hot water. Bandage care  If you were given a bandage, change it as told by your health care provider.  Wash your hands with soap and water before and after you change your bandage. If soap and water are not available, use hand sanitizer. General instructions  Avoid the things that caused your reaction. If you do not know what caused it, keep a journal. Write down: ? What you eat. ? What skin products you use. ? What you drink. ? What you wear in the area that has symptoms. This includes jewelry.  Check the affected areas every day for signs of infection. Check for: ? More redness, swelling, or pain. ? More fluid or blood. ? Warmth. ? Pus or a bad smell.  Keep all follow-up visits as told by your doctor. This is important. Contact a doctor if:  You do not get better with treatment.  Your condition gets worse.  You have signs of infection, such as: ? More swelling. ? Tenderness. ? More redness. ? Soreness. ? Warmth.  You have a fever.  You have new symptoms. Get help right away  if:  You have a very bad headache.  You have neck pain.  Your neck is stiff.  You throw up (vomit).  You feel very sleepy.  You see red streaks coming from the area.  Your bone or joint near the area hurts after the skin has healed.  The area turns darker.  You have trouble breathing. Summary  Dermatitis is redness, soreness, and swelling of the skin.  Symptoms may occur where the irritant has touched you.   Treatment may include medicines and skin care.  If you do not know what caused your reaction, keep a journal.  Contact a doctor if your condition gets worse or you have signs of infection. This information is not intended to replace advice given to you by your health care provider. Make sure you discuss any questions you have with your health care provider. Document Released: 01/19/2009 Document Revised: 10/07/2017 Document Reviewed: 10/07/2017 Elsevier Interactive Patient Education  2019 [employer].

## 2018-09-01 NOTE — Assessment & Plan Note (Signed)
Unknown cause, only new items have been bras and vitamin.  Have advised to return to old bras and she has stopped taking vitamins.  Script for Prednisone and Clobetasol cream sent.  Recommend use of daily Zyrtec or Claritin for pruritus or may take Benadryl as needed.  Calamine lotion as needed and oatmeal baths.  Referral for allergy testing.  Return for worsening or continued symptoms.

## 2018-11-23 ENCOUNTER — Ambulatory Visit: Payer: 59 | Admitting: Nurse Practitioner

## 2018-11-26 ENCOUNTER — Other Ambulatory Visit: Payer: Self-pay

## 2018-11-26 ENCOUNTER — Ambulatory Visit (INDEPENDENT_AMBULATORY_CARE_PROVIDER_SITE_OTHER): Payer: 59 | Admitting: Nurse Practitioner

## 2018-11-26 ENCOUNTER — Encounter: Payer: Self-pay | Admitting: Nurse Practitioner

## 2018-11-26 DIAGNOSIS — I10 Essential (primary) hypertension: Secondary | ICD-10-CM

## 2018-11-26 MED ORDER — HYDROCHLOROTHIAZIDE 25 MG PO TABS: 25.0000 mg | ORAL_TABLET | Freq: Every day | ORAL | 3 refills | Status: DC

## 2018-11-26 NOTE — Progress Notes (Signed)
BP (!) 140/98 (BP Location: Left Arm, Patient Position: Sitting)   Pulse 79   Temp 98.9 F (37.2 C) (Oral)   SpO2 97%    Subjective:    Patient ID: Kathleen [name], female    DOB: Sep 08, 1980, 38 y.o.   MRN: [id number]  HPI: Kathleen [name] is a 38 y.o. female  Chief Complaint  Patient presents with  . Hypertension    pt states she has not taken her medicine in a month   HYPERTENSION BP elevated today.  Patient reports she has not taken medication in one month due to being fatigued, did not know if this was side effect.  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 Hypertension status: uncontrolled  Satisfied with current treatment? no Duration of hypertension: chronic BP monitoring frequency:  not checking BP range:  BP medication side effects:  no Medication compliance: good compliance Aspirin: no Recurrent headaches: no Visual changes: no Palpitations: no Dyspnea: no Chest pain: no Lower extremity edema: no Dizzy/lightheaded: no  Relevant past medical, surgical, family and social history reviewed and updated as indicated. Interim medical history since our last visit reviewed. Allergies and medications reviewed and updated.  Review of Systems  Constitutional: Negative for activity change, appetite change, diaphoresis, fatigue and fever.  Respiratory: Negative for cough, chest tightness and shortness of breath.   Cardiovascular: Positive for leg swelling (at baseline). Negative for chest pain and palpitations.  Gastrointestinal: Negative for abdominal distention, abdominal pain, constipation, diarrhea, nausea and vomiting.  Neurological: Negative for dizziness, syncope, weakness, light-headedness, numbness and headaches.  Psychiatric/Behavioral: Negative.     Per HPI unless specifically indicated above     Objective:    BP (!) 140/98 (BP Location: Left Arm, Patient Position: Sitting)   Pulse 79   Temp 98.9 F (37.2 C) (Oral)   SpO2  97%   Wt Readings from Last 3 Encounters:  08/04/18 181 lb (82.1 kg)  06/15/18 209 lb (94.8 kg)  06/01/18 210 lb (95.3 kg)    Physical Exam Vitals signs and nursing note reviewed.  Constitutional:      General: She is awake. She is not in acute distress.    Appearance: She is well-developed. She is not ill-appearing.  HENT:     Head: Normocephalic.     Right Ear: Hearing normal.     Left Ear: Hearing normal.  Eyes:     General: Lids are normal.        Right eye: No discharge.        Left eye: No discharge.     Conjunctiva/sclera: Conjunctivae normal.     Pupils: Pupils are equal, round, and reactive to light.  Neck:     Musculoskeletal: Normal range of motion and neck supple.     Vascular: No carotid bruit.  Cardiovascular:     Rate and Rhythm: Normal rate and regular rhythm.     Heart sounds: Normal heart sounds. No murmur. No gallop.   Pulmonary:     Effort: Pulmonary effort is normal. No accessory muscle usage or respiratory distress.     Breath sounds: Normal breath sounds.  Abdominal:     General: Bowel sounds are normal.     Palpations: Abdomen is soft.     Tenderness: There is no abdominal tenderness.  Musculoskeletal:     Right lower leg: Edema (trace) present.     Left lower leg: Edema (trace) present.  Lymphadenopathy:     Cervical: No  cervical adenopathy.  Skin:    General: Skin is warm and dry.  Neurological:     Mental Status: She is alert and oriented to person, place, and time.  Psychiatric:        Attention and Perception: Attention normal.        Mood and Affect: Mood normal.        Behavior: Behavior normal. Behavior is cooperative.        Thought Content: Thought content normal.        Judgment: Judgment normal.     Results for orders placed or performed in visit on 05/25/18  WET PREP FOR [place], YEAST, CLUE   Specimen: Vaginal Fluid   VAGINAL FLUI  Result Value Ref Range   Trichomonas Exam Negative Negative   Yeast Exam Positive (A)  Negative   Clue Cell Exam Positive (A) Negative  HIV Antibody (routine testing w rflx)  Result Value Ref Range   HIV Screen 4th Generation wRfx Non Reactive Non Reactive  Cytology - PAP  Result Value Ref Range   Adequacy      Satisfactory for evaluation  endocervical/transformation zone component ABSENT.   Diagnosis      NEGATIVE FOR INTRAEPITHELIAL LESIONS OR MALIGNANCY.   HPV DETECTED (A)    Material Submitted CervicoVaginal Pap [ThinPrep Imaged]       Assessment & Plan:   Problem List Items Addressed This Visit      Cardiovascular and Mediastinum   Essential hypertension    Chronic, not taking medication over past month and would like to try something different.  Will change to HCTZ, which may also benefit edema.  Educated her on medication and importance of adherence to daily use.  Recommend checking BP daily at home and documenting with goal <130/90.  Return in 4 weeks.      Relevant Medications   hydrochlorothiazide (HYDRODIURIL) 25 MG tablet       Follow up plan: Return in about 4 weeks (around 12/24/2018) for HTN.

## 2018-11-26 NOTE — Patient Instructions (Addendum)
Vitamin D3 1000 units daily Vitamin B12 500 to 1000 MCG daily  Hydrochlorothiazide, HCTZ capsules or tablets What is this medicine? HYDROCHLOROTHIAZIDE (hye droe klor oh THYE a zide) is a diuretic. It increases the amount of urine passed, which causes the body to lose salt and water. This medicine is used to treat high blood pressure. It is also reduces the swelling and water retention caused by various medical conditions, such as heart, liver, or kidney disease. This medicine may be used for other purposes; ask your health care provider or pharmacist if you have questions. COMMON BRAND NAME(S): Esidrix, Ezide, HydroDIURIL, Microzide, Oretic, Zide What should I tell my health care provider before I take this medicine? They need to know if you have any of these conditions:  diabetes  gout  immune system problems, like lupus  kidney disease or kidney stones  liver disease  pancreatitis  small amount of urine or difficulty passing urine  an unusual or allergic reaction to hydrochlorothiazide, sulfa drugs, other medicines, foods, dyes, or preservatives  pregnant or trying to get pregnant  breast-feeding How should I use this medicine? Take this medicine by mouth with a glass of water. Follow the directions on the prescription label. Take your medicine at regular intervals. Remember that you will need to pass urine frequently after taking this medicine. Do not take your doses at a time of day that will cause you problems. Do not stop taking your medicine unless your doctor tells you to. Talk to your pediatrician regarding the use of this medicine in children. Special care may be needed. Overdosage: If you think you have taken too much of this medicine contact a poison control center or emergency room at once. NOTE: This medicine is only for you. Do not share this medicine with others. What if I miss a dose? If you miss a dose, take it as soon as you can. If it is almost time for your  next dose, take only that dose. Do not take double or extra doses. What may interact with this medicine?  cholestyramine  colestipol  digoxin  dofetilide  lithium  medicines for blood pressure  medicines for diabetes  medicines that relax muscles for surgery  other diuretics  steroid medicines like prednisone or cortisone This list may not describe all possible interactions. Give your health care provider a list of all the medicines, herbs, non-prescription drugs, or dietary supplements you use. Also tell them if you smoke, drink alcohol, or use illegal drugs. Some items may interact with your medicine. What should I watch for while using this medicine? Visit your doctor or health care professional for regular checks on your progress. Check your blood pressure as directed. Ask your doctor or health care professional what your blood pressure should be and when you should contact him or her. You may need to be on a special diet while taking this medicine. Ask your doctor. Check with your doctor or health care professional if you get an attack of severe diarrhea, nausea and vomiting, or if you sweat a lot. The loss of too much body fluid can make it dangerous for you to take this medicine. You may get drowsy or dizzy. Do not drive, use machinery, or do anything that needs mental alertness until you know how this medicine affects you. Do not stand or sit up quickly, especially if you are an older patient. This reduces the risk of dizzy or fainting spells. Alcohol may interfere with the effect of this medicine.  Avoid alcoholic drinks. This medicine may increase blood sugar. Ask your healthcare provider if changes in diet or medicines are needed if you have diabetes. This medicine can make you more sensitive to the sun. Keep out of the sun. If you cannot avoid being in the sun, wear protective clothing and use sunscreen. Do not use sun lamps or tanning beds/booths. What side effects may I  notice from receiving this medicine? Side effects that you should report to your doctor or health care professional as soon as possible:  allergic reactions such as skin rash or itching, hives, swelling of the lips, mouth, tongue, or throat  changes in vision  chest pain  eye pain  fast or irregular heartbeat  feeling faint or lightheaded, falls  gout attack  muscle pain or cramps  pain or difficulty when passing urine  pain, tingling, numbness in the hands or feet  redness, blistering, peeling or loosening of the skin, including inside the mouth   signs and symptoms of high blood sugar such as being more thirsty or hungry or having to urinate more than normal. You may also feel very tired or have blurry vision.  unusually weak Side effects that usually do not require medical attention (report to your doctor or health care professional if they continue or are bothersome):  change in sex drive or performance  dry mouth  headache  stomach upset This list may not describe all possible side effects. Call your doctor for medical advice about side effects. You may report side effects to FDA at 1-800-FDA-1088. Where should I keep my medicine? Keep out of the reach of children. Store at room temperature between 15 and 30 degrees C (59 and 86 degrees F). Do not freeze. Protect from light and moisture. Keep container closed tightly. Throw away any unused medicine after the expiration date. NOTE: This sheet is a summary. It may not cover all possible information. If you have questions about this medicine, talk to your doctor, pharmacist, or health care provider.  2020 Elsevier/Gold Standard (2018-01-13 09:25:06)   DASH Eating Plan DASH stands for "Dietary Approaches to Stop Hypertension." The DASH eating plan is a healthy eating plan that has been shown to reduce high blood pressure (hypertension). It may also reduce your risk for type 2 diabetes, heart disease, and stroke. The  DASH eating plan may also help with weight loss. What are tips for following this plan?  General guidelines  Avoid eating more than 2,300 mg (milligrams) of salt (sodium) a day. If you have hypertension, you may need to reduce your sodium intake to 1,500 mg a day.  Limit alcohol intake to no more than 1 drink a day for nonpregnant women and 2 drinks a day for men. One drink equals 12 oz of beer, 5 oz of wine, or 1 oz of hard liquor.  Work with your health care provider to maintain a healthy body weight or to lose weight. Ask what an ideal weight is for you.  Get at least 30 minutes of exercise that causes your heart to beat faster (aerobic exercise) most days of the week. Activities may include walking, swimming, or biking.  Work with your health care provider or diet and nutrition specialist (dietitian) to adjust your eating plan to your individual calorie needs. Reading food labels   Check food labels for the amount of sodium per serving. Choose foods with less than 5 percent of the Daily Value of sodium. Generally, foods with less than 300 mg of  sodium per serving fit into this eating plan.  To find whole grains, look for the word "whole" as the first word in the ingredient list. Shopping  Buy products labeled as "low-sodium" or "no salt added."  Buy fresh foods. Avoid canned foods and premade or frozen meals. Cooking  Avoid adding salt when cooking. Use salt-free seasonings or herbs instead of table salt or sea salt. Check with your health care provider or pharmacist before using salt substitutes.  Do not fry foods. Cook foods using healthy methods such as baking, boiling, grilling, and broiling instead.  Cook with heart-healthy oils, such as olive, canola, soybean, or sunflower oil. Meal planning  Eat a balanced diet that includes: ? 5 or more servings of fruits and vegetables each day. At each meal, try to fill half of your plate with fruits and vegetables. ? Up to 6-8  servings of whole grains each day. ? Less than 6 oz of lean meat, poultry, or fish each day. A 3-oz serving of meat is about the same size as a deck of cards. One egg equals 1 oz. ? 2 servings of low-fat dairy each day. ? A serving of nuts, seeds, or beans 5 times each week. ? Heart-healthy fats. Healthy fats called Omega-3 fatty acids are found in foods such as flaxseeds and coldwater fish, like sardines, salmon, and mackerel.  Limit how much you eat of the following: ? Canned or prepackaged foods. ? Food that is high in trans fat, such as fried foods. ? Food that is high in saturated fat, such as fatty meat. ? Sweets, desserts, sugary drinks, and other foods with added sugar. ? Full-fat dairy products.  Do not salt foods before eating.  Try to eat at least 2 vegetarian meals each week.  Eat more home-cooked food and less restaurant, buffet, and fast food.  When eating at a restaurant, ask that your food be prepared with less salt or no salt, if possible. What foods are recommended? The items listed may not be a complete list. Talk with your dietitian about what dietary choices are best for you. Grains Whole-grain or whole-wheat bread. Whole-grain or whole-wheat pasta. Brown rice. [place]. Bulgur. Whole-grain and low-sodium cereals. Pita bread. Low-fat, low-sodium crackers. Whole-wheat flour tortillas. Vegetables Fresh or frozen vegetables (raw, steamed, roasted, or grilled). Low-sodium or reduced-sodium tomato and vegetable juice. Low-sodium or reduced-sodium tomato sauce and tomato paste. Low-sodium or reduced-sodium canned vegetables. Fruits All fresh, dried, or frozen fruit. Canned fruit in natural juice (without added sugar). Meat and other protein foods Skinless chicken or [place]. Ground chicken or [place]. Pork with fat trimmed off. Fish and seafood. Egg whites. Dried beans, peas, or lentils. Unsalted nuts, nut butters, and seeds. Unsalted canned beans. Lean cuts of beef  with fat trimmed off. Low-sodium, lean deli meat. Dairy Low-fat (1%) or fat-free (skim) milk. Fat-free, low-fat, or reduced-fat cheeses. Nonfat, low-sodium ricotta or cottage cheese. Low-fat or nonfat yogurt. Low-fat, low-sodium cheese. Fats and oils Soft margarine without trans fats. Vegetable oil. Low-fat, reduced-fat, or light mayonnaise and salad dressings (reduced-sodium). Canola, safflower, olive, soybean, and sunflower oils. Avocado. Seasoning and other foods Herbs. Spices. Seasoning mixes without salt. Unsalted popcorn and pretzels. Fat-free sweets. What foods are not recommended? The items listed may not be a complete list. Talk with your dietitian about what dietary choices are best for you. Grains Baked goods made with fat, such as croissants, muffins, or some breads. Dry pasta or rice meal packs. Vegetables Creamed or fried vegetables.  Vegetables in a cheese sauce. Regular canned vegetables (not low-sodium or reduced-sodium). Regular canned tomato sauce and paste (not low-sodium or reduced-sodium). Regular tomato and vegetable juice (not low-sodium or reduced-sodium). Angie Fava. Olives. Fruits Canned fruit in a light or heavy syrup. Fried fruit. Fruit in cream or butter sauce. Meat and other protein foods Fatty cuts of meat. Ribs. Fried meat. Berniece Salines. Sausage. Bologna and other processed lunch meats. Salami. Fatback. Hotdogs. Bratwurst. Salted nuts and seeds. Canned beans with added salt. Canned or smoked fish. Whole eggs or egg yolks. Chicken or [place] with skin. Dairy Whole or 2% milk, cream, and half-and-half. Whole or full-fat cream cheese. Whole-fat or sweetened yogurt. Full-fat cheese. Nondairy creamers. Whipped toppings. Processed cheese and cheese spreads. Fats and oils Butter. Stick margarine. Lard. Shortening. Ghee. Bacon fat. Tropical oils, such as coconut, palm kernel, or palm oil. Seasoning and other foods Salted popcorn and pretzels. Onion salt, garlic salt, seasoned salt,  table salt, and sea salt. Worcestershire sauce. Tartar sauce. Barbecue sauce. Teriyaki sauce. Soy sauce, including reduced-sodium. Steak sauce. Canned and packaged gravies. Fish sauce. Oyster sauce. Cocktail sauce. Horseradish that you find on the shelf. Ketchup. Mustard. Meat flavorings and tenderizers. Bouillon cubes. Hot sauce and Tabasco sauce. Premade or packaged marinades. Premade or packaged taco seasonings. Relishes. Regular salad dressings. Where to find more information:  National Heart, Lung, and [place]: https://wilson-eaton.com/  American Heart Association: www.heart.org Summary  The DASH eating plan is a healthy eating plan that has been shown to reduce high blood pressure (hypertension). It may also reduce your risk for type 2 diabetes, heart disease, and stroke.  With the DASH eating plan, you should limit salt (sodium) intake to 2,300 mg a day. If you have hypertension, you may need to reduce your sodium intake to 1,500 mg a day.  When on the DASH eating plan, aim to eat more fresh fruits and vegetables, whole grains, lean proteins, low-fat dairy, and heart-healthy fats.  Work with your health care provider or diet and nutrition specialist (dietitian) to adjust your eating plan to your individual calorie needs. This information is not intended to replace advice given to you by your health care provider. Make sure you discuss any questions you have with your health care provider. Document Released: 03/13/2011 Document Revised: 03/06/2017 Document Reviewed: 03/17/2016 Elsevier Patient Education  2020 [employer].

## 2018-11-26 NOTE — Assessment & Plan Note (Signed)
Chronic, not taking medication over past month and would like to try something different.  Will change to HCTZ, which may also benefit edema.  Educated her on medication and importance of adherence to daily use.  Recommend checking BP daily at home and documenting with goal <130/90.  Return in 4 weeks.

## 2018-12-14 ENCOUNTER — Ambulatory Visit: Payer: Self-pay

## 2018-12-17 ENCOUNTER — Encounter (INDEPENDENT_AMBULATORY_CARE_PROVIDER_SITE_OTHER): Payer: Self-pay

## 2018-12-17 ENCOUNTER — Ambulatory Visit (INDEPENDENT_AMBULATORY_CARE_PROVIDER_SITE_OTHER): Payer: Self-pay | Admitting: Vascular Surgery

## 2018-12-21 ENCOUNTER — Ambulatory Visit: Payer: Self-pay

## 2018-12-24 ENCOUNTER — Other Ambulatory Visit: Payer: Self-pay

## 2018-12-24 ENCOUNTER — Ambulatory Visit (INDEPENDENT_AMBULATORY_CARE_PROVIDER_SITE_OTHER): Payer: 59 | Admitting: Nurse Practitioner

## 2018-12-24 ENCOUNTER — Encounter: Payer: Self-pay | Admitting: Nurse Practitioner

## 2018-12-24 VITALS — BP 128/86 | HR 99 | Temp 98.7°F

## 2018-12-24 DIAGNOSIS — I10 Essential (primary) hypertension: Secondary | ICD-10-CM | POA: Diagnosis not present

## 2018-12-24 NOTE — Progress Notes (Signed)
BP 128/86 (BP Location: Left Arm, Patient Position: Sitting)   Pulse 99   Temp 98.7 F (37.1 C) (Oral)   SpO2 97%    Subjective:    Patient ID: Kathleen [name], female    DOB: [birth year], 38 y.o.   MRN: [id number]  HPI: Kathleen [name] is a 38 y.o. female  Chief Complaint  Patient presents with  . Hypertension   HYPERTENSION Last visit was started on HCTZ.  Had previously been on Losartan, but did not like way it made her feel.  Has not taken medication this morning, likes to eat prior to taking and has not ate yet.  Has been taking cinnamon and apple cider vinegar. Hypertension status: stable  Satisfied with current treatment? yes Duration of hypertension: chronic BP monitoring frequency:  not checking BP range:  BP medication side effects:  no Medication compliance: good compliance Previous BP meds:Losartan and HCTZ Aspirin: no Recurrent headaches: no Visual changes: no Palpitations: no Dyspnea: no Chest pain: no Lower extremity edema: no Dizzy/lightheaded: no  Relevant past medical, surgical, family and social history reviewed and updated as indicated. Interim medical history since our last visit reviewed. Allergies and medications reviewed and updated.  Review of Systems  Constitutional: Negative for activity change, appetite change, diaphoresis, fatigue and fever.  Respiratory: Negative for cough, chest tightness and shortness of breath.   Cardiovascular: Negative for chest pain, palpitations and leg swelling.  Gastrointestinal: Negative for abdominal distention, abdominal pain, constipation, diarrhea, nausea and vomiting.  Neurological: Negative for dizziness, syncope, weakness, light-headedness, numbness and headaches.  Psychiatric/Behavioral: Negative.     Per HPI unless specifically indicated above     Objective:    BP 128/86 (BP Location: Left Arm, Patient Position: Sitting)   Pulse 99   Temp 98.7 F (37.1 C) (Oral)   SpO2 97%   Wt Readings  from Last 3 Encounters:  08/04/18 181 lb (82.1 kg)  06/15/18 209 lb (94.8 kg)  06/01/18 210 lb (95.3 kg)    Physical Exam Vitals signs and nursing note reviewed.  Constitutional:      General: She is awake. She is not in acute distress.    Appearance: She is well-developed. She is not ill-appearing.  HENT:     Head: Normocephalic.     Right Ear: Hearing normal.     Left Ear: Hearing normal.  Eyes:     General: Lids are normal.        Right eye: No discharge.        Left eye: No discharge.     Conjunctiva/sclera: Conjunctivae normal.     Pupils: Pupils are equal, round, and reactive to light.  Neck:     Musculoskeletal: Normal range of motion and neck supple.     Thyroid: No thyromegaly.     Vascular: No carotid bruit.  Cardiovascular:     Rate and Rhythm: Normal rate and regular rhythm.     Heart sounds: Normal heart sounds. No murmur. No gallop.   Pulmonary:     Effort: Pulmonary effort is normal. No accessory muscle usage or respiratory distress.     Breath sounds: Normal breath sounds.  Abdominal:     General: Bowel sounds are normal.     Palpations: Abdomen is soft.  Musculoskeletal:     Right lower leg: No edema.     Left lower leg: No edema.  Skin:    General: Skin is warm and dry.  Neurological:     Mental Status: She  is alert and oriented to person, place, and time.  Psychiatric:        Attention and Perception: Attention normal.        Mood and Affect: Mood normal.        Behavior: Behavior normal. Behavior is cooperative.        Thought Content: Thought content normal.        Judgment: Judgment normal.     Results for orders placed or performed in visit on 12/10/18  [facility] HIV SCREENING LAB  Result Value Ref Range   HM HIV Screening Negative - Validated   HM PAP SMEAR  Result Value Ref Range   HM Pap smear Normal       Assessment & Plan:   Problem List Items Addressed This Visit      Cardiovascular and Mediastinum   Essential hypertension - Primary     Chronic, ongoing with initial BP elevated (has not taken medication this morning), but manual repeat below goal.  Recommend she check BP at home at least 3 mornings a week and document + stick to DASH diet at home. Continue current medication regimen.  BMP today.  Plan on follow-up in 6 months.      Relevant Orders   Basic Metabolic Panel (BMET)       Follow up plan: Return in about 6 months (around 06/23/2019) for HTN.

## 2018-12-24 NOTE — Patient Instructions (Signed)

## 2018-12-24 NOTE — Assessment & Plan Note (Signed)
Chronic, ongoing with initial BP elevated (has not taken medication this morning), but manual repeat below goal.  Recommend she check BP at home at least 3 mornings a week and document + stick to DASH diet at home. Continue current medication regimen.  BMP today.  Plan on follow-up in 6 months.

## 2018-12-25 LAB — BASIC METABOLIC PANEL
BUN/Creatinine Ratio: 13 (ref 9–23)
BUN: 12 mg/dL (ref 6–20)
CO2: 29 mmol/L (ref 20–29)
Calcium: 9.8 mg/dL (ref 8.7–10.2)
Chloride: 95 mmol/L — ABNORMAL LOW (ref 96–106)
Creatinine, Ser: 0.91 mg/dL (ref 0.57–1.00)
GFR calc Af Amer: 93 mL/min/{1.73_m2} (ref >59)
GFR calc non Af Amer: 81 mL/min/{1.73_m2} (ref >59)
Glucose: 87 mg/dL (ref 65–99)
Potassium: 3.2 mmol/L — ABNORMAL LOW (ref 3.5–5.2)
Sodium: 139 mmol/L (ref 134–144)

## 2018-12-28 ENCOUNTER — Other Ambulatory Visit: Payer: Self-pay | Admitting: Nurse Practitioner

## 2018-12-28 DIAGNOSIS — I10 Essential (primary) hypertension: Secondary | ICD-10-CM

## 2018-12-28 NOTE — Progress Notes (Signed)
K+ recheck 

## 2019-01-05 ENCOUNTER — Other Ambulatory Visit: Payer: 59

## 2019-01-05 ENCOUNTER — Other Ambulatory Visit: Payer: Self-pay

## 2019-01-05 DIAGNOSIS — I10 Essential (primary) hypertension: Secondary | ICD-10-CM

## 2019-01-06 ENCOUNTER — Ambulatory Visit: Payer: Self-pay

## 2019-01-06 LAB — POTASSIUM: Potassium: 4.4 mmol/L (ref 3.5–5.2)

## 2019-01-21 ENCOUNTER — Encounter (INDEPENDENT_AMBULATORY_CARE_PROVIDER_SITE_OTHER): Payer: 59

## 2019-01-21 ENCOUNTER — Ambulatory Visit (INDEPENDENT_AMBULATORY_CARE_PROVIDER_SITE_OTHER): Payer: 59 | Admitting: Vascular Surgery

## 2019-01-26 ENCOUNTER — Ambulatory Visit: Payer: Self-pay | Admitting: Nurse Practitioner

## 2019-02-02 ENCOUNTER — Ambulatory Visit: Payer: Self-pay | Admitting: Nurse Practitioner

## 2019-02-08 ENCOUNTER — Ambulatory Visit: Payer: Self-pay | Admitting: Nurse Practitioner

## 2019-02-14 ENCOUNTER — Other Ambulatory Visit: Payer: Self-pay

## 2019-02-15 ENCOUNTER — Ambulatory Visit: Payer: 59 | Admitting: Nurse Practitioner

## 2019-02-15 ENCOUNTER — Other Ambulatory Visit: Payer: Self-pay

## 2019-02-15 ENCOUNTER — Encounter: Payer: Self-pay | Admitting: Nurse Practitioner

## 2019-02-15 DIAGNOSIS — I10 Essential (primary) hypertension: Secondary | ICD-10-CM

## 2019-02-15 NOTE — Patient Instructions (Signed)

## 2019-02-15 NOTE — Progress Notes (Signed)
BP 128/89   Pulse 82   Temp 98.6 F (37 C) (Oral)   Ht 5\' 6"  (1.676 m)   Wt 212 lb (96.2 kg)   SpO2 98%   BMI 34.22 kg/m    Subjective:    Patient ID: Kathleen [name], female    DOB: 1981-01-21, 38 y.o.   MRN: [id number]  HPI: Kathleen [name] is a 38 y.o. female  Chief Complaint  Patient presents with  . Hypertension   HYPERTENSION Stopped HCTZ last visit due to low K+, improved without HCTZ on board.  Has been working out and [employer] recently. Hypertension status: stable  Satisfied with current treatment? yes Duration of hypertension: chronic BP monitoring frequency:  not checking BP range:  BP medication side effects:  no Medication compliance: good compliance Aspirin: no Recurrent headaches: no Visual changes: no Palpitations: no Dyspnea: no Chest pain: no Lower extremity edema: no Dizzy/lightheaded: no  Relevant past medical, surgical, family and social history reviewed and updated as indicated. Interim medical history since our last visit reviewed. Allergies and medications reviewed and updated.  Review of Systems  Constitutional: Negative for activity change, appetite change, diaphoresis, fatigue and fever.  Respiratory: Negative for cough, chest tightness and shortness of breath.   Cardiovascular: Negative for chest pain, palpitations and leg swelling.  Gastrointestinal: Negative for abdominal distention, abdominal pain, constipation, diarrhea, nausea and vomiting.  Neurological: Negative for dizziness, syncope, weakness, light-headedness, numbness and headaches.  Psychiatric/Behavioral: Negative.     Per HPI unless specifically indicated above     Objective:    BP 128/89   Pulse 82   Temp 98.6 F (37 C) (Oral)   Ht 5\' 6"  (1.676 m)   Wt 212 lb (96.2 kg)   SpO2 98%   BMI 34.22 kg/m   Wt Readings from Last 3 Encounters:  02/15/19 212 lb (96.2 kg)  08/04/18 181 lb (82.1 kg)  06/15/18 209 lb (94.8 kg)    Physical Exam Vitals signs  and nursing note reviewed.  Constitutional:      General: She is awake. She is not in acute distress.    Appearance: She is well-developed and overweight. She is not ill-appearing.  HENT:     Head: Normocephalic.     Right Ear: Hearing normal.     Left Ear: Hearing normal.  Eyes:     General: Lids are normal.        Right eye: No discharge.        Left eye: No discharge.     Conjunctiva/sclera: Conjunctivae normal.     Pupils: Pupils are equal, round, and reactive to light.  Neck:     Musculoskeletal: Normal range of motion and neck supple.     Thyroid: No thyromegaly.     Vascular: No carotid bruit.  Cardiovascular:     Rate and Rhythm: Normal rate and regular rhythm.     Heart sounds: Normal heart sounds. No murmur. No gallop.   Pulmonary:     Effort: Pulmonary effort is normal. No accessory muscle usage or respiratory distress.     Breath sounds: Normal breath sounds.  Abdominal:     General: Bowel sounds are normal.     Palpations: Abdomen is soft.  Musculoskeletal:     Right lower leg: No edema.     Left lower leg: No edema.  Skin:    General: Skin is warm and dry.  Neurological:     Mental Status: She is alert and oriented to  person, place, and time.  Psychiatric:        Attention and Perception: Attention normal.        Mood and Affect: Mood normal.        Behavior: Behavior normal. Behavior is cooperative.        Thought Content: Thought content normal.        Judgment: Judgment normal.    Results for orders placed or performed in visit on 01/05/19  Potassium  Result Value Ref Range   Potassium 4.4 3.5 - 5.2 mmol/L      Assessment & Plan:   Problem List Items Addressed This Visit      Cardiovascular and Mediastinum   Essential hypertension    Chronic, stable with BP at goal today.  Will continue off medication at this time and focus on diet and exercise regimen.  Recommend checking BP regularly at home.  Return in 6 months for annual physical.           Follow up plan: Return in about 6 months (around 08/15/2019) for Annual physical.

## 2019-02-15 NOTE — Assessment & Plan Note (Signed)
Chronic, stable with BP at goal today.  Will continue off medication at this time and focus on diet and exercise regimen.  Recommend checking BP regularly at home.  Return in 6 months for annual physical.

## 2019-02-20 IMAGING — DX DG FEMUR 2+V*L*
4 series · 4 of 4 positions shown · non-contrast
Comparison: None.

CLINICAL DATA: Followup osteo chondroma

EXAM:
LEFT FEMUR 2 VIEWS

[femur ap (1 of 2)]
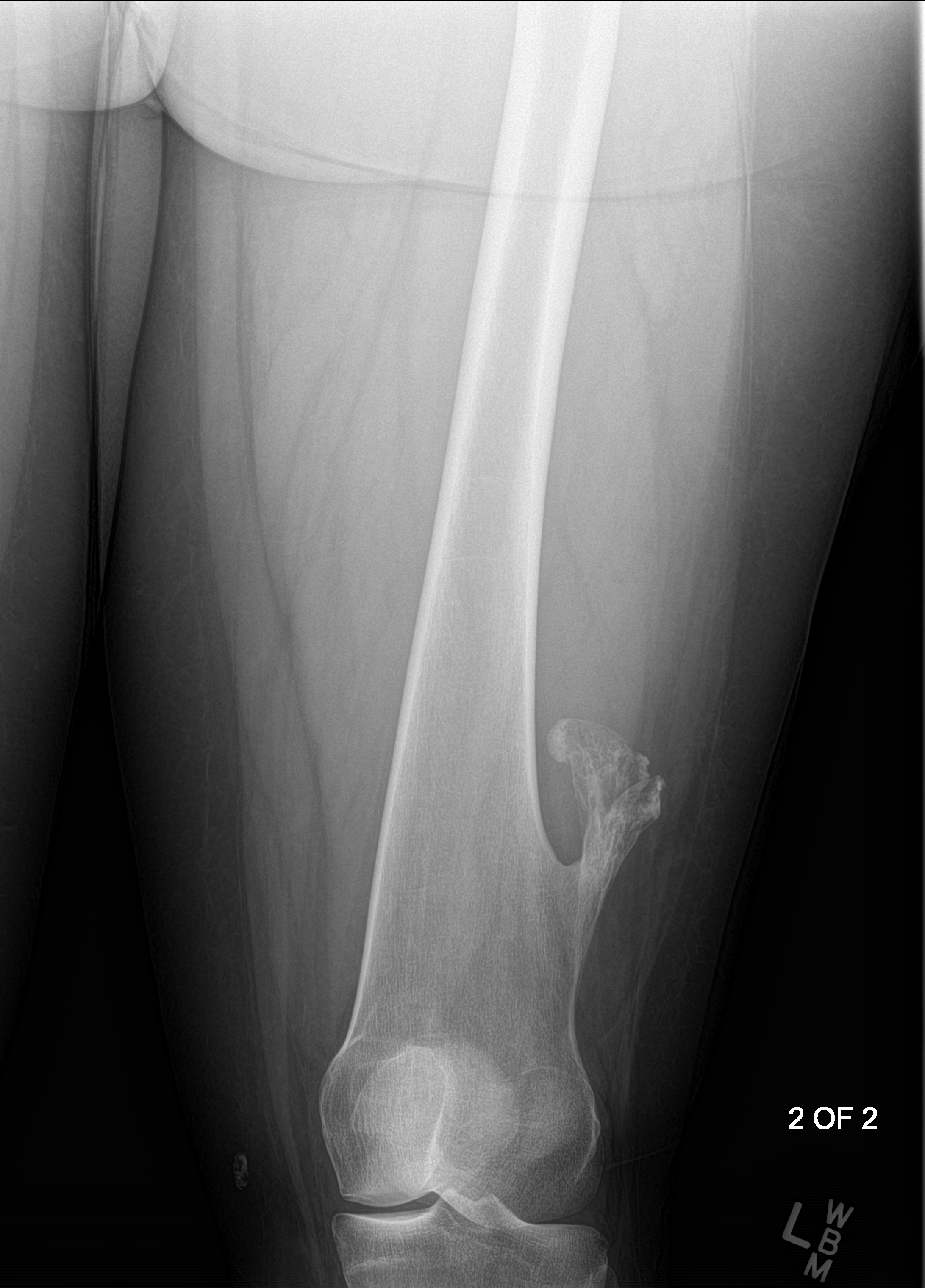

[femur ap (2 of 2)]
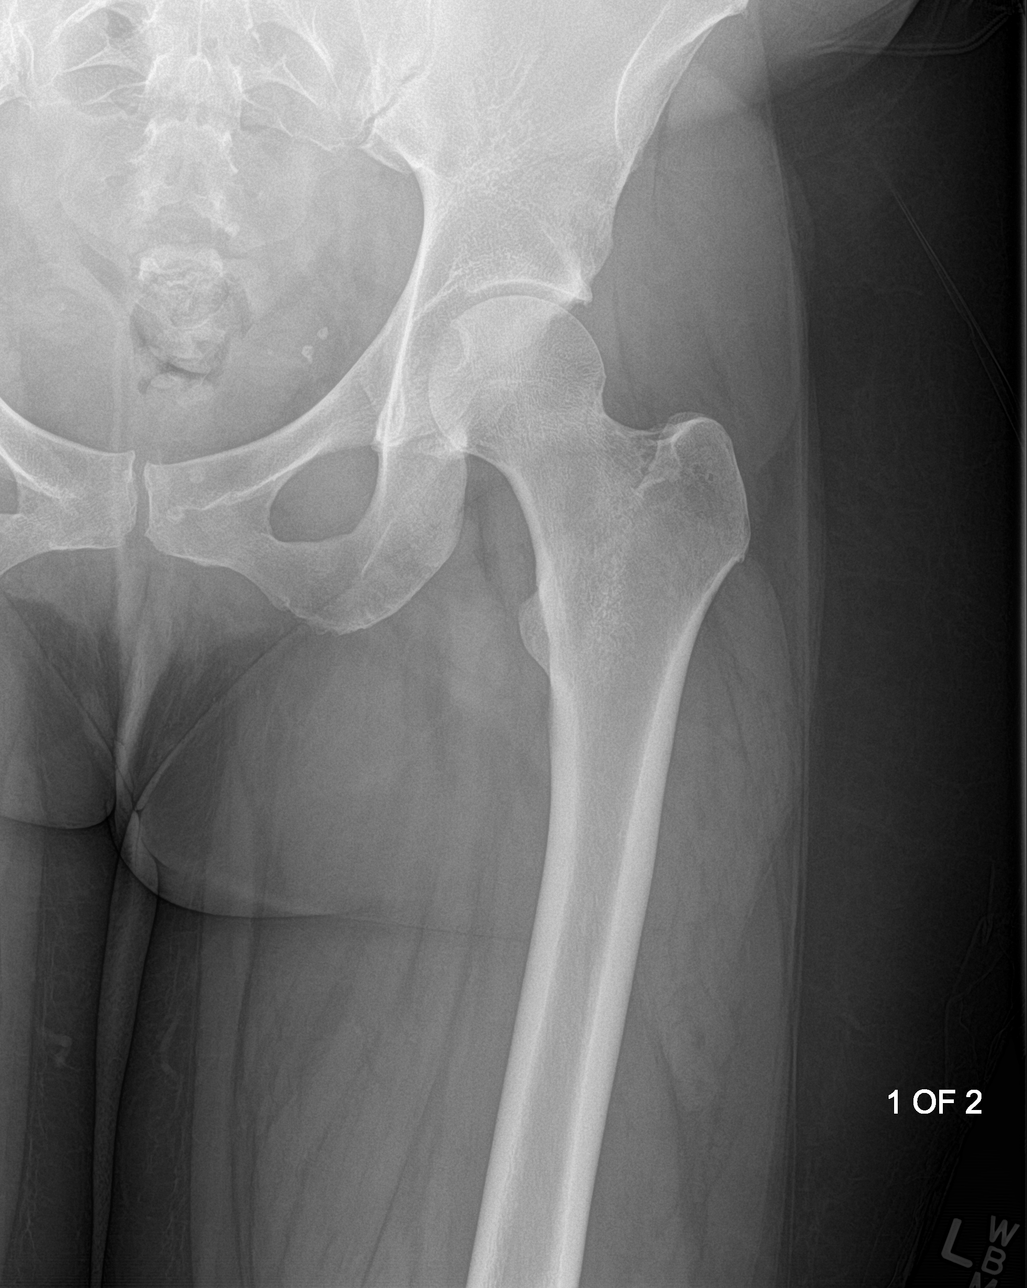

[femur lat (1 of 2)]
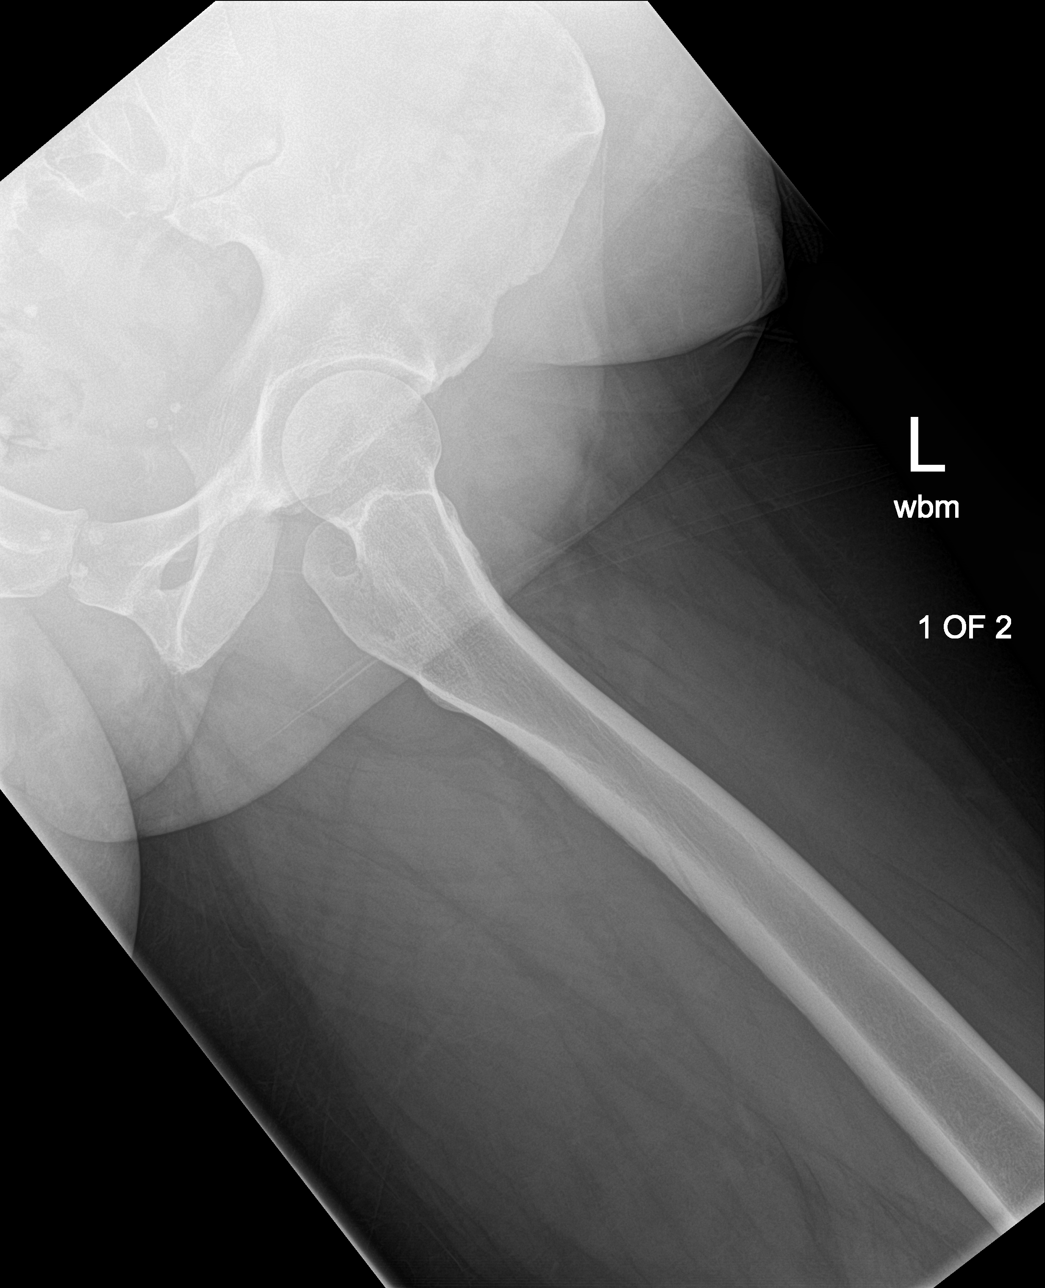

[femur lat (2 of 2)]
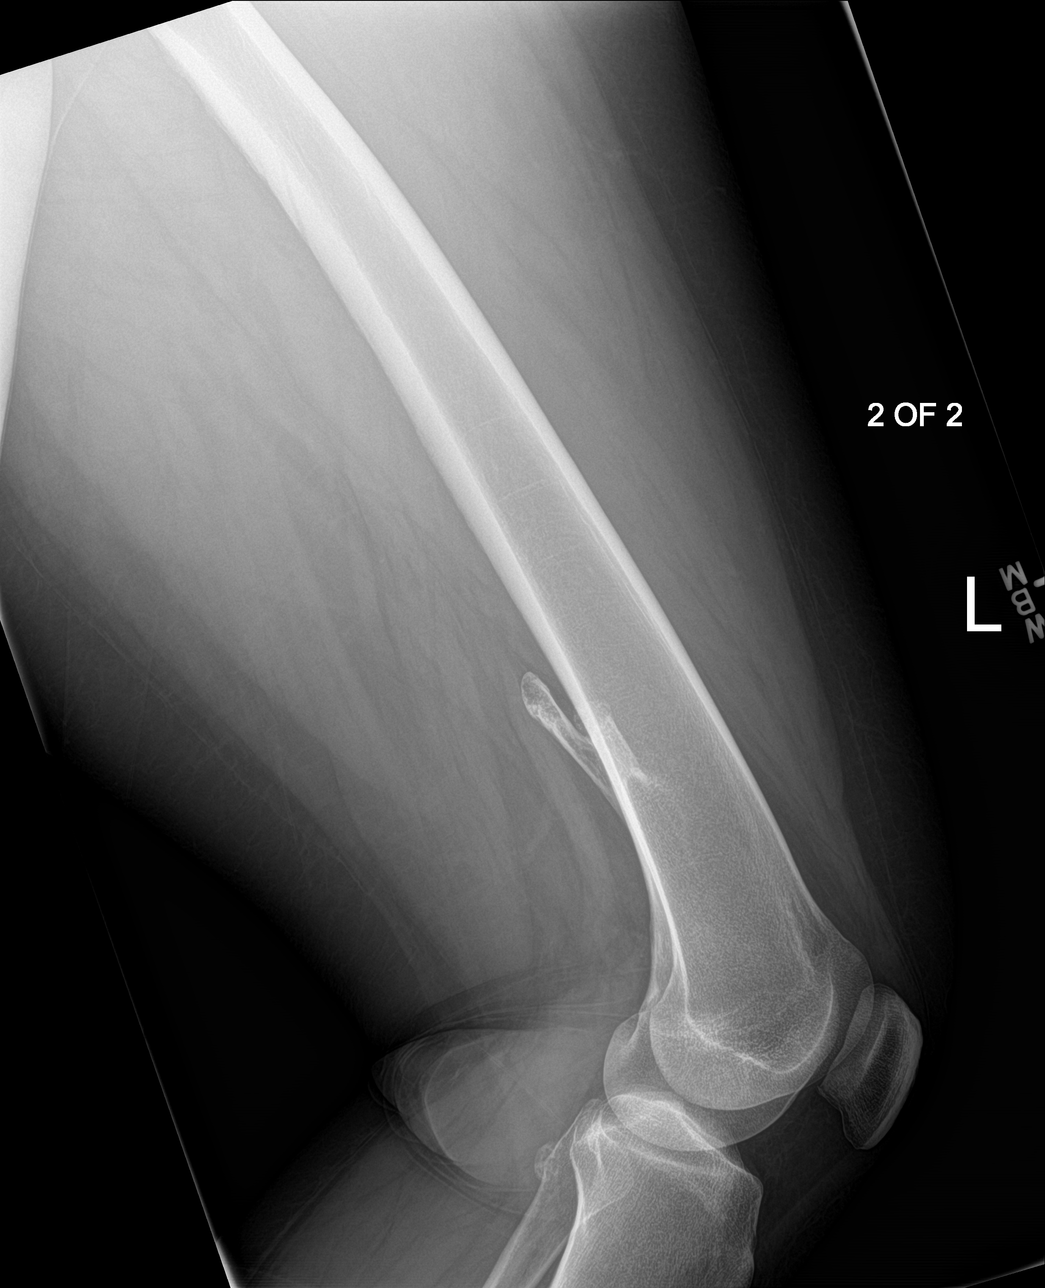

[4 of 4 positions shown; findings below may reference images not displayed]

FINDINGS: Left hip joint and proximal femur are normal. There is a large
osteochondroma the lateral distal femoral metaphysis with
metaphyseal widening. Bony portion of the osteochondroma measures
7.9 cm in length from the normal/expected cortical location. No sign
of pronounced associated soft tissue density to suggest bursitis or
large cartilaginous mass.
IMPRESSION: Large osteochondroma of the lateral distal femoral metaphysis.

## 2019-02-25 ENCOUNTER — Encounter (INDEPENDENT_AMBULATORY_CARE_PROVIDER_SITE_OTHER): Payer: Self-pay | Admitting: Vascular Surgery

## 2019-05-19 ENCOUNTER — Ambulatory Visit: Payer: 59 | Attending: Internal Medicine

## 2019-05-19 DIAGNOSIS — Z20822 Contact with and (suspected) exposure to covid-19: Secondary | ICD-10-CM

## 2019-05-20 LAB — NOVEL CORONAVIRUS, NAA: SARS-CoV-2, NAA: DETECTED — AB

## 2019-05-25 ENCOUNTER — Telehealth: Payer: Self-pay | Admitting: Nurse Practitioner

## 2019-05-25 ENCOUNTER — Encounter: Payer: Self-pay | Admitting: Nurse Practitioner

## 2019-05-25 NOTE — Telephone Encounter (Signed)
Unable to Lvm x3 and sent letter.

## 2019-05-25 NOTE — Telephone Encounter (Signed)
-----   Message from Landis Gandy sent at 05/24/2019 10:13 AM EST ----- Unable to call and lvm x2  ----- Message ----- From: Venita Lick, NP Sent: 05/20/2019   1:02 PM EST To: [facility]  Will need 10 day virtual Covid follow-up scheduled please.  Thank you.

## 2019-05-27 NOTE — Telephone Encounter (Signed)
Unable to lvm x4

## 2019-05-27 NOTE — Telephone Encounter (Signed)
-----   Message from Landis Gandy sent at 05/25/2019 11:15 AM EST ----- Unable to lvm x3, sent letter  ----- Message ----- From: Landis Gandy Sent: 05/24/2019  10:13 AM EST To: [facility]  Unable to call and lvm x2  ----- Message ----- From: Venita Lick, NP Sent: 05/20/2019   1:02 PM EST To: [facility]  Will need 10 day virtual Covid follow-up scheduled please.  Thank you.

## 2019-06-08 NOTE — Telephone Encounter (Signed)
Pt called regarding letter to schedule appt. She is scheduled for OV on 06/24/19.

## 2019-06-08 NOTE — Telephone Encounter (Signed)
Called pt advised to her that she was suppose to do covid f/u pt states that she is better and back at work now.

## 2019-06-08 NOTE — Telephone Encounter (Signed)
Noted can keep 06/24/2019 visit.

## 2019-06-24 ENCOUNTER — Ambulatory Visit: Payer: 59 | Admitting: Unknown Physician Specialty

## 2019-06-24 ENCOUNTER — Other Ambulatory Visit: Payer: Self-pay

## 2019-08-15 ENCOUNTER — Other Ambulatory Visit: Payer: Self-pay

## 2019-08-15 ENCOUNTER — Encounter: Payer: Self-pay | Admitting: Nurse Practitioner

## 2019-08-15 ENCOUNTER — Other Ambulatory Visit (HOSPITAL_COMMUNITY)
Admission: RE | Admit: 2019-08-15 | Discharge: 2019-08-15 | Disposition: A | Discharge status: Home / Self Care | Payer: 59 | Source: Ambulatory Visit | Attending: Nurse Practitioner | Admitting: Nurse Practitioner

## 2019-08-15 ENCOUNTER — Ambulatory Visit (INDEPENDENT_AMBULATORY_CARE_PROVIDER_SITE_OTHER): Payer: 59 | Admitting: Nurse Practitioner

## 2019-08-15 VITALS — BP 136/92 | HR 83 | Temp 98.6°F | Ht 67.0 in | Wt 205.0 lb

## 2019-08-15 DIAGNOSIS — R8789 Other abnormal findings in specimens from female genital organs: Secondary | ICD-10-CM

## 2019-08-15 DIAGNOSIS — I871 Compression of vein: Secondary | ICD-10-CM | POA: Diagnosis not present

## 2019-08-15 DIAGNOSIS — D1622 Benign neoplasm of long bones of left lower limb: Secondary | ICD-10-CM | POA: Diagnosis not present

## 2019-08-15 DIAGNOSIS — Z0000 Encounter for general adult medical examination without abnormal findings: Secondary | ICD-10-CM

## 2019-08-15 DIAGNOSIS — Z13220 Encounter for screening for lipoid disorders: Secondary | ICD-10-CM

## 2019-08-15 DIAGNOSIS — F1290 Cannabis use, unspecified, uncomplicated: Secondary | ICD-10-CM

## 2019-08-15 DIAGNOSIS — I10 Essential (primary) hypertension: Secondary | ICD-10-CM | POA: Diagnosis not present

## 2019-08-15 DIAGNOSIS — E669 Obesity, unspecified: Secondary | ICD-10-CM

## 2019-08-15 DIAGNOSIS — R87618 Other abnormal cytological findings on specimens from cervix uteri: Secondary | ICD-10-CM | POA: Insufficient documentation

## 2019-08-15 DIAGNOSIS — E559 Vitamin D deficiency, unspecified: Secondary | ICD-10-CM

## 2019-08-15 NOTE — Assessment & Plan Note (Signed)
Continue compression hose daily and collaboration with vascular as needed. 

## 2019-08-15 NOTE — Patient Instructions (Signed)
DASH Eating Plan DASH stands for "Dietary Approaches to Stop Hypertension." The DASH eating plan is a healthy eating plan that has been shown to reduce high blood pressure (hypertension). It may also reduce your risk for type 2 diabetes, heart disease, and stroke. The DASH eating plan may also help with weight loss. What are tips for following this plan?  General guidelines  Avoid eating more than 2,300 mg (milligrams) of salt (sodium) a day. If you have hypertension, you may need to reduce your sodium intake to 1,500 mg a day.  Limit alcohol intake to no more than 1 drink a day for nonpregnant women and 2 drinks a day for men. One drink equals 12 oz of beer, 5 oz of wine, or 1 oz of hard liquor.  Work with your health care provider to maintain a healthy body weight or to lose weight. Ask what an ideal weight is for you.  Get at least 30 minutes of exercise that causes your heart to beat faster (aerobic exercise) most days of the week. Activities may include walking, swimming, or biking.  Work with your health care provider or diet and nutrition specialist (dietitian) to adjust your eating plan to your individual calorie needs. Reading food labels   Check food labels for the amount of sodium per serving. Choose foods with less than 5 percent of the Daily Value of sodium. Generally, foods with less than 300 mg of sodium per serving fit into this eating plan.  To find whole grains, look for the word "whole" as the first word in the ingredient list. Shopping  Buy products labeled as "low-sodium" or "no salt added."  Buy fresh foods. Avoid canned foods and premade or frozen meals. Cooking  Avoid adding salt when cooking. Use salt-free seasonings or herbs instead of table salt or sea salt. Check with your health care provider or pharmacist before using salt substitutes.  Do not fry foods. Cook foods using healthy methods such as baking, boiling, grilling, and broiling instead.  Cook with  heart-healthy oils, such as olive, canola, soybean, or sunflower oil. Meal planning  Eat a balanced diet that includes: ? 5 or more servings of fruits and vegetables each day. At each meal, try to fill half of your plate with fruits and vegetables. ? Up to 6-8 servings of whole grains each day. ? Less than 6 oz of lean meat, poultry, or fish each day. A 3-oz serving of meat is about the same size as a deck of cards. One egg equals 1 oz. ? 2 servings of low-fat dairy each day. ? A serving of nuts, seeds, or beans 5 times each week. ? Heart-healthy fats. Healthy fats called Omega-3 fatty acids are found in foods such as flaxseeds and coldwater fish, like sardines, salmon, and mackerel.  Limit how much you eat of the following: ? Canned or prepackaged foods. ? Food that is high in trans fat, such as fried foods. ? Food that is high in saturated fat, such as fatty meat. ? Sweets, desserts, sugary drinks, and other foods with added sugar. ? Full-fat dairy products.  Do not salt foods before eating.  Try to eat at least 2 vegetarian meals each week.  Eat more home-cooked food and less restaurant, buffet, and fast food.  When eating at a restaurant, ask that your food be prepared with less salt or no salt, if possible. What foods are recommended? The items listed may not be a complete list. Talk with your dietitian about   what dietary choices are best for you. Grains Whole-grain or whole-wheat bread. Whole-grain or whole-wheat pasta. Brown rice. Oatmeal. Quinoa. Bulgur. Whole-grain and low-sodium cereals. Pita bread. Low-fat, low-sodium crackers. Whole-wheat flour tortillas. Vegetables Fresh or frozen vegetables (raw, steamed, roasted, or grilled). Low-sodium or reduced-sodium tomato and vegetable juice. Low-sodium or reduced-sodium tomato sauce and tomato paste. Low-sodium or reduced-sodium canned vegetables. Fruits All fresh, dried, or frozen fruit. Canned fruit in natural juice (without  added sugar). Meat and other protein foods Skinless chicken or turkey. Ground chicken or turkey. Pork with fat trimmed off. Fish and seafood. Egg whites. Dried beans, peas, or lentils. Unsalted nuts, nut butters, and seeds. Unsalted canned beans. Lean cuts of beef with fat trimmed off. Low-sodium, lean deli meat. Dairy Low-fat (1%) or fat-free (skim) milk. Fat-free, low-fat, or reduced-fat cheeses. Nonfat, low-sodium ricotta or cottage cheese. Low-fat or nonfat yogurt. Low-fat, low-sodium cheese. Fats and oils Soft margarine without trans fats. Vegetable oil. Low-fat, reduced-fat, or light mayonnaise and salad dressings (reduced-sodium). Canola, safflower, olive, soybean, and sunflower oils. Avocado. Seasoning and other foods Herbs. Spices. Seasoning mixes without salt. Unsalted popcorn and pretzels. Fat-free sweets. What foods are not recommended? The items listed may not be a complete list. Talk with your dietitian about what dietary choices are best for you. Grains Baked goods made with fat, such as croissants, muffins, or some breads. Dry pasta or rice meal packs. Vegetables Creamed or fried vegetables. Vegetables in a cheese sauce. Regular canned vegetables (not low-sodium or reduced-sodium). Regular canned tomato sauce and paste (not low-sodium or reduced-sodium). Regular tomato and vegetable juice (not low-sodium or reduced-sodium). Pickles. Olives. Fruits Canned fruit in a light or heavy syrup. Fried fruit. Fruit in cream or butter sauce. Meat and other protein foods Fatty cuts of meat. Ribs. Fried meat. Bacon. Sausage. Bologna and other processed lunch meats. Salami. Fatback. Hotdogs. Bratwurst. Salted nuts and seeds. Canned beans with added salt. Canned or smoked fish. Whole eggs or egg yolks. Chicken or turkey with skin. Dairy Whole or 2% milk, cream, and half-and-half. Whole or full-fat cream cheese. Whole-fat or sweetened yogurt. Full-fat cheese. Nondairy creamers. Whipped toppings.  Processed cheese and cheese spreads. Fats and oils Butter. Stick margarine. Lard. Shortening. Ghee. Bacon fat. Tropical oils, such as coconut, palm kernel, or palm oil. Seasoning and other foods Salted popcorn and pretzels. Onion salt, garlic salt, seasoned salt, table salt, and sea salt. Worcestershire sauce. Tartar sauce. Barbecue sauce. Teriyaki sauce. Soy sauce, including reduced-sodium. Steak sauce. Canned and packaged gravies. Fish sauce. Oyster sauce. Cocktail sauce. Horseradish that you find on the shelf. Ketchup. Mustard. Meat flavorings and tenderizers. Bouillon cubes. Hot sauce and Tabasco sauce. Premade or packaged marinades. Premade or packaged taco seasonings. Relishes. Regular salad dressings. Where to find more information:  National Heart, Lung, and Blood Institute: www.nhlbi.nih.gov  American Heart Association: www.heart.org Summary  The DASH eating plan is a healthy eating plan that has been shown to reduce high blood pressure (hypertension). It may also reduce your risk for type 2 diabetes, heart disease, and stroke.  With the DASH eating plan, you should limit salt (sodium) intake to 2,300 mg a day. If you have hypertension, you may need to reduce your sodium intake to 1,500 mg a day.  When on the DASH eating plan, aim to eat more fresh fruits and vegetables, whole grains, lean proteins, low-fat dairy, and heart-healthy fats.  Work with your health care provider or diet and nutrition specialist (dietitian) to adjust your eating plan to your   individual calorie needs. This information is not intended to replace advice given to you by your health care provider. Make sure you discuss any questions you have with your health care provider. Document Revised: 03/06/2017 Document Reviewed: 03/17/2016 Elsevier Patient Education  2020 [employer]  

## 2019-08-15 NOTE — Assessment & Plan Note (Signed)
Continue to collaborate with ortho at [facility].

## 2019-08-15 NOTE — Progress Notes (Signed)
BP (!) 136/92 (BP Location: Right Arm, Cuff Size: Normal)   Pulse 83   Temp 98.6 F (37 C) (Oral)   Ht 5\' 7"  (1.702 m)   Wt 205 lb (93 kg)   LMP 08/09/2019 (Exact Date)   SpO2 98%   BMI 32.11 kg/m    Subjective:    Patient ID: Kathleen [name], female    DOB: [birth year], 39 y.o.   MRN: [id number]  HPI: Kathleen [name] is a 39 y.o. female presenting on 08/15/2019 for comprehensive medical examination. Current medical complaints include:none  She currently lives with: kids Menopausal Symptoms: no   Has diagnosis of benign osteochondroma of left femur, last seen at [facility] 04/20/2018 with no intervention at this time.  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  Duplex was performed by vascular and on intervention recommended at this time, to wear compression hose.  She would like her Vitamin D checked today, reports history of low levels and it feeling more fatigued lately   HYPERTENSION In past have tried Lisinopril and HCTZ, with HCTZ causing hypokalemia and reports of Lisinopril causing fatigue.  At this time point is focused on diet and exercise. Hypertension status: stable  Satisfied with current treatment? yes Duration of hypertension: chronic BP monitoring frequency:  a few times a week BP range: 130/80-90 BP medication side effects:  no Medication compliance: good compliance Aspirin: no Recurrent headaches: no Visual changes: no Palpitations: no Dyspnea: no Chest pain: no Lower extremity edema: yes Dizzy/lightheaded: no  Depression Screen done today and results listed below:  Depression screen [facility] 2/9 08/15/2019 05/25/2018 02/22/2018  Decreased Interest 0 0 1  Down, Depressed, Hopeless 0 0 0  PHQ - 2 Score 0 0 1  Altered sleeping 0 - 1  Tired, decreased energy 0 - 1  Change in appetite 0 - 0  Feeling bad or failure about yourself  0 - 1  Trouble concentrating 0 - 0   Moving slowly or fidgety/restless 0 - 0  Suicidal thoughts 0 - 0  PHQ-9 Score 0 - 4  Difficult doing work/chores Not difficult at all - Not difficult at all    The patient does not have a history of falls. I did not complete a risk assessment for falls. A plan of care for falls was not documented.   Past Medical History:  History reviewed. No pertinent past medical history.  Surgical History:  Past Surgical History:  Procedure Laterality Date  . STENT PLACEMENT VASCULAR (Estelle HX)    . TUBAL LIGATION      Medications:  Current Outpatient Medications on File Prior to Visit  Medication Sig  . EPINEPHrine 0.3 mg/0.3 mL IJ SOAJ injection Inject 0.3 mg into the muscle as needed.    No current facility-administered medications on file prior to visit.    Allergies:  No Known Allergies  Social History:  Social History   Socioeconomic History  . Marital status: Single    Spouse name: Not on file  . Number of children: Not on file  . Years of education: Not on file  . Highest education level: Not on file  Occupational History  . Not on file  Tobacco Use  . Smoking status: Never Smoker  . Smokeless tobacco: Never Used  Substance and Sexual Activity  . Alcohol use: Yes    Alcohol/week: 1.0 standard drinks    Types: 1 Glasses of wine per  week  . Drug use: Yes    Types: Marijuana  . Sexual activity: Yes  Other Topics Concern  . Not on file  Social History Narrative  . Not on file   Social Determinants of Health   Financial Resource Strain:   . Difficulty of Paying Living Expenses:   Food Insecurity:   . Worried About Charity fundraiser in the Last Year:   . Arboriculturist in the Last Year:   Transportation Needs:   . Film/video editor (Medical):   Marland Kitchen Lack of Transportation (Non-Medical):   Physical Activity:   . Days of Exercise per Week:   . Minutes of Exercise per Session:   Stress:   . Feeling of Stress :   Social Connections:   . Frequency of  Communication with Friends and Family:   . Frequency of Social Gatherings with Friends and Family:   . Attends Religious Services:   . Active Member of Clubs or Organizations:   . Attends Archivist Meetings:   Marland Kitchen Marital Status:   Intimate Partner Violence:   . Fear of Current or Ex-Partner:   . Emotionally Abused:   Marland Kitchen Physically Abused:   . Sexually Abused:    Social History   Tobacco Use  Smoking Status Never Smoker  Smokeless Tobacco Never Used   Social History   Substance and Sexual Activity  Alcohol Use Yes  . Alcohol/week: 1.0 standard drinks  . Types: 1 Glasses of wine per week    Family History:  Family History  Problem Relation Age of Onset  . Congestive Heart Failure Mother   . Irritable bowel syndrome Sister     Past medical history, surgical history, medications, allergies, family history and social history reviewed with patient today and changes made to appropriate areas of the chart.   Review of Systems - negative All other ROS negative except what is listed above and in the HPI.      Objective:    BP (!) 136/92 (BP Location: Right Arm, Cuff Size: Normal)   Pulse 83   Temp 98.6 F (37 C) (Oral)   Ht 5\' 7"  (1.702 m)   Wt 205 lb (93 kg)   LMP 08/09/2019 (Exact Date)   SpO2 98%   BMI 32.11 kg/m   Wt Readings from Last 3 Encounters:  08/15/19 205 lb (93 kg)  02/15/19 212 lb (96.2 kg)  08/04/18 181 lb (82.1 kg)    Physical Exam Constitutional:      General: She is awake. She is not in acute distress.    Appearance: She is well-developed. She is not ill-appearing.  HENT:     Head: Normocephalic and atraumatic.     Right Ear: Hearing, tympanic membrane, ear canal and external ear normal. No drainage.     Left Ear: Hearing, tympanic membrane, ear canal and external ear normal. No drainage.     Nose: Nose normal.     Right Sinus: No maxillary sinus tenderness or frontal sinus tenderness.     Left Sinus: No maxillary sinus tenderness or  frontal sinus tenderness.     Mouth/Throat:     Mouth: Mucous membranes are moist.     Pharynx: Oropharynx is clear. Uvula midline. No pharyngeal swelling, oropharyngeal exudate or posterior oropharyngeal erythema.  Eyes:     General: Lids are normal.        Right eye: No discharge.        Left eye: No discharge.  Extraocular Movements: Extraocular movements intact.     Conjunctiva/sclera: Conjunctivae normal.     Pupils: Pupils are equal, round, and reactive to light.     Visual Fields: Right eye visual fields normal and left eye visual fields normal.  Neck:     Thyroid: No thyromegaly.     Vascular: No carotid bruit.     Trachea: Trachea normal.  Cardiovascular:     Rate and Rhythm: Normal rate and regular rhythm.     Heart sounds: Normal heart sounds. No murmur. No gallop.   Pulmonary:     Effort: Pulmonary effort is normal. No accessory muscle usage or respiratory distress.     Breath sounds: Normal breath sounds.  Chest:     Breasts:        Right: Normal.        Left: Normal.  Abdominal:     General: Bowel sounds are normal.     Palpations: Abdomen is soft. There is no hepatomegaly or splenomegaly.     Tenderness: There is no abdominal tenderness.  Genitourinary:    Exam position: Lithotomy position.     Vagina: Normal.     Cervix: Normal.     Uterus: Normal.      Adnexa: Right adnexa normal and left adnexa normal.     Comments: Some old blood remains present from recent cycle.  Cervix anterior, sample obtained for pap. Musculoskeletal:        General: Normal range of motion.     Cervical back: Normal range of motion and neck supple.     Right lower leg: No edema.     Left lower leg: No edema.  Lymphadenopathy:     Head:     Right side of head: No submental, submandibular, tonsillar, preauricular or posterior auricular adenopathy.     Left side of head: No submental, submandibular, tonsillar, preauricular or posterior auricular adenopathy.     Cervical: No  cervical adenopathy.     Upper Body:     Right upper body: No supraclavicular, axillary or pectoral adenopathy.     Left upper body: No supraclavicular, axillary or pectoral adenopathy.  Skin:    General: Skin is warm and dry.     Capillary Refill: Capillary refill takes less than 2 seconds.     Findings: No rash.  Neurological:     Mental Status: She is alert and oriented to person, place, and time.     Cranial Nerves: Cranial nerves are intact.     Gait: Gait is intact.     Deep Tendon Reflexes: Reflexes are normal and symmetric.     Reflex Scores:      Brachioradialis reflexes are 2+ on the right side and 2+ on the left side.      Patellar reflexes are 2+ on the right side and 2+ on the left side. Psychiatric:        Attention and Perception: Attention normal.        Mood and Affect: Mood normal.        Speech: Speech normal.        Behavior: Behavior normal. Behavior is cooperative.        Thought Content: Thought content normal.        Judgment: Judgment normal.    Results for orders placed or performed in visit on 05/19/19  Novel Coronavirus, NAA ([facility])   Specimen: Nasopharyngeal(NP) swabs in vial transport medium   NASOPHARYNGE  TESTING  Result Value Ref Range   SARS-CoV-2,  NAA Detected (A) Not Detected      Assessment & Plan:   Problem List Items Addressed This Visit      Cardiovascular and Mediastinum   Essential hypertension    Chronic, ongoing with initial BP elevated, but repeat improved, but still slightly above goal.  Will continue off medication at this time and focus on diet and exercise regimen, patient wishes not to restart medication.  Recommend checking BP regularly at home.  Return in 6 months for BP check.  BMP and TSH today.      Relevant Orders   CBC with Differential/Platelet   Comprehensive metabolic panel   TSH   May-Thurner syndrome    Continue compression hose daily and collaboration with vascular as needed.        Musculoskeletal and  Integument   Osteochondroma of femur, left    Continue to collaborate with ortho at [facility].        Other   Obesity (BMI 30.0-34.9)    Recommended eating smaller high protein, low fat meals more frequently and exercising 30 mins a day 5 times a week with a goal of 10-15lb weight loss in the next 3 months. Patient voiced their understanding and motivation to adhere to these recommendations.       Marijuana smoker    Recommend cessation of use.      Pap smear abnormality of cervix/human papillomavirus (HPV) positive    Noted on pap last year, repeat today.  Discussed with patient if unsatisfactory sample may need to repeat, as some old blood remains present from recent cycle.      Relevant Orders   Cytology - PAP    Other Visit Diagnoses    Encounter for annual physical exam    -  Primary   Vitamin D deficiency       history of low level, check today and start supplement as needed.   Relevant Orders   VITAMIN D 25 Hydroxy (Vit-D Deficiency, Fractures)   Screening cholesterol level       Lipid panel today   Relevant Orders   Lipid Panel w/o Chol/HDL Ratio       Follow up plan: Return in about 6 months (around 02/15/2020) for BP.   LABORATORY TESTING:  - Pap smear: obtained today, but some old blood remains present from recent cycle -- she is aware we may need to repeat if not able to obtain adequate sample.  IMMUNIZATIONS:   - Tdap: Tetanus vaccination status reviewed: last tetanus booster within 10 years. - Influenza: Up to date - Pneumovax: Not applicable - Prevnar: Not applicable - HPV: Not applicable - Zostavax vaccine: Not applicable  SCREENING: -Mammogram: Not applicable  - Colonoscopy: Not applicable  - Bone Density: Not applicable  -Hearing Test: Not applicable  -Spirometry: Not applicable   PATIENT COUNSELING:   Advised to take 1 mg of folate supplement per day if capable of pregnancy.   Sexuality: Discussed sexually transmitted diseases, partner  selection, use of condoms, avoidance of unintended pregnancy  and contraceptive alternatives.   Advised to avoid cigarette smoking.  I discussed with the patient that most people either abstain from alcohol or drink within safe limits (<=14/week and <=4 drinks/occasion for males, <=7/weeks and <= 3 drinks/occasion for females) and that the risk for alcohol disorders and other health effects rises proportionally with the number of drinks per week and how often a drinker exceeds daily limits.  Discussed cessation/primary prevention of drug use and availability of treatment for abuse.  Diet: Encouraged to adjust caloric intake to maintain  or achieve ideal body weight, to reduce intake of dietary saturated fat and total fat, to limit sodium intake by avoiding high sodium foods and not adding table salt, and to maintain adequate dietary potassium and calcium preferably from fresh fruits, vegetables, and low-fat dairy products.    stressed the importance of regular exercise  Injury prevention: Discussed safety belts, safety helmets, smoke detector, smoking near bedding or upholstery.   Dental health: Discussed importance of regular tooth brushing, flossing, and dental visits.    NEXT PREVENTATIVE PHYSICAL DUE IN 1 YEAR. Return in about 6 months (around 02/15/2020) for BP.

## 2019-08-15 NOTE — Assessment & Plan Note (Signed)
Chronic, ongoing with initial BP elevated, but repeat improved, but still slightly above goal.  Will continue off medication at this time and focus on diet and exercise regimen, patient wishes not to restart medication.  Recommend checking BP regularly at home.  Return in 6 months for BP check.  BMP and TSH today.

## 2019-08-15 NOTE — Assessment & Plan Note (Signed)
Recommended eating smaller high protein, low fat meals more frequently and exercising 30 mins a day 5 times a week with a goal of 10-15lb weight loss in the next 3 months. Patient voiced their understanding and motivation to adhere to these recommendations.  

## 2019-08-15 NOTE — Assessment & Plan Note (Signed)
Recommend cessation of use.

## 2019-08-15 NOTE — Assessment & Plan Note (Signed)
Noted on pap last year, repeat today.  Discussed with patient if unsatisfactory sample may need to repeat, as some old blood remains present from recent cycle.

## 2019-08-16 ENCOUNTER — Other Ambulatory Visit: Payer: Self-pay | Admitting: Nurse Practitioner

## 2019-08-16 DIAGNOSIS — E559 Vitamin D deficiency, unspecified: Secondary | ICD-10-CM | POA: Insufficient documentation

## 2019-08-16 DIAGNOSIS — R7989 Other specified abnormal findings of blood chemistry: Secondary | ICD-10-CM | POA: Insufficient documentation

## 2019-08-16 LAB — CBC WITH DIFFERENTIAL/PLATELET
Basophils Absolute: 0 10*3/uL (ref 0.0–0.2)
Basos: 1 %
EOS (ABSOLUTE): 0.2 10*3/uL (ref 0.0–0.4)
Eos: 5 %
Hematocrit: 37.2 % (ref 34.0–46.6)
Hemoglobin: 12 g/dL (ref 11.1–15.9)
Immature Grans (Abs): 0 10*3/uL (ref 0.0–0.1)
Immature Granulocytes: 0 %
Lymphocytes Absolute: 1.5 10*3/uL (ref 0.7–3.1)
Lymphs: 36 %
MCH: 27.9 pg (ref 26.6–33.0)
MCHC: 32.3 g/dL (ref 31.5–35.7)
MCV: 87 fL (ref 79–97)
Monocytes Absolute: 0.3 10*3/uL (ref 0.1–0.9)
Monocytes: 6 %
Neutrophils Absolute: 2.1 10*3/uL (ref 1.4–7.0)
Neutrophils: 52 %
Platelets: 340 10*3/uL (ref 150–450)
RBC: 4.3 x10E6/uL (ref 3.77–5.28)
RDW: 14.6 % (ref 11.7–15.4)
WBC: 4.1 10*3/uL (ref 3.4–10.8)

## 2019-08-16 LAB — COMPREHENSIVE METABOLIC PANEL
ALT: 6 IU/L (ref 0–32)
AST: 10 IU/L (ref 0–40)
Albumin/Globulin Ratio: 1.6 (ref 1.2–2.2)
Albumin: 4.2 g/dL (ref 3.8–4.8)
Alkaline Phosphatase: 49 IU/L (ref 39–117)
BUN/Creatinine Ratio: 11 (ref 9–23)
BUN: 10 mg/dL (ref 6–20)
Bilirubin Total: 0.7 mg/dL (ref 0.0–1.2)
CO2: 23 mmol/L (ref 20–29)
Calcium: 9.2 mg/dL (ref 8.7–10.2)
Chloride: 104 mmol/L (ref 96–106)
Creatinine, Ser: 0.89 mg/dL (ref 0.57–1.00)
GFR calc Af Amer: 96 mL/min/{1.73_m2} (ref >59)
GFR calc non Af Amer: 83 mL/min/{1.73_m2} (ref >59)
Globulin, Total: 2.7 g/dL (ref 1.5–4.5)
Glucose: 89 mg/dL (ref 65–99)
Potassium: 4.1 mmol/L (ref 3.5–5.2)
Sodium: 139 mmol/L (ref 134–144)
Total Protein: 6.9 g/dL (ref 6.0–8.5)

## 2019-08-16 LAB — LIPID PANEL W/O CHOL/HDL RATIO
Cholesterol, Total: 151 mg/dL (ref 100–199)
HDL: 69 mg/dL (ref >39)
LDL Chol Calc (NIH): 71 mg/dL (ref 0–99)
Triglycerides: 51 mg/dL (ref 0–149)
VLDL Cholesterol Cal: 11 mg/dL (ref 5–40)

## 2019-08-16 LAB — VITAMIN D 25 HYDROXY (VIT D DEFICIENCY, FRACTURES): Vit D, 25-Hydroxy: 14.1 ng/mL — ABNORMAL LOW (ref 30.0–100.0)

## 2019-08-16 LAB — TSH: TSH: 0.156 u[IU]/mL — ABNORMAL LOW (ref 0.450–4.500)

## 2019-08-16 MED ORDER — CHOLECALCIFEROL 1.25 MG (50000 UT) PO TABS: 1.0000 | ORAL_TABLET | ORAL | 0 refills | Status: DC

## 2019-08-16 NOTE — Progress Notes (Signed)
Vitamin D supplement and repeat thyroid labs.

## 2019-08-16 NOTE — Progress Notes (Signed)
Contacted via [place] evening Ameilia.  Your blood work has returned and overall looks good with two exceptions: - Vitamin D level is low, I am going to send in a weekly high dose Vitamin D for you to take over the next 8 weeks, then we need to recheck level via outpatient labs and if improved can change to daily Vitamin D3 1000 units which you can obtain over the counter. - TSH, thyroid labs, was on low side -- meaning more hyperthyroid.  Could you come in for recheck on these labs via outpatient labs in 2 weeks?  You will need to schedule an outpatient lab visit only.  If still on low side then may consider referring you to endocrinology for further assessment.   Let me know you received this message and if any questions.  Thank you. Keep being awesome!! Kindest regards, Mertis Mosher

## 2019-08-17 ENCOUNTER — Other Ambulatory Visit: Payer: Self-pay | Admitting: Nurse Practitioner

## 2019-08-17 LAB — CYTOLOGY - PAP
Comment: NEGATIVE
Diagnosis: NEGATIVE
High risk HPV: NEGATIVE

## 2019-08-17 MED ORDER — METRONIDAZOLE 500 MG PO TABS
500.0000 mg | ORAL_TABLET | Freq: Two times a day (BID) | ORAL | 0 refills | Status: DC
Start: 2019-08-17 — End: 2019-09-15

## 2019-08-17 NOTE — Progress Notes (Signed)
Contacted via MyChart  Good evening Kathleen [name].  Your pap has returned and good news is negative for HPV!!!  Woohoo!!  Overall normal pap -- this means we can repeat pap in 3 years now, 2024.  One thing was noted though, you did have some bacterial vaginosis, a bacterial infection of vagina.  For this we treat with Flagyl.  I will send this in and recommend you take until complete.  Do not take with alcohol and take with food.  Any questions?  Let me know you received this.   Keep being awesome!! Kindest regards, Gaye Scorza

## 2019-08-17 NOTE — Progress Notes (Signed)
Flagyl rx for BV on pap

## 2019-08-31 ENCOUNTER — Ambulatory Visit: Payer: 59 | Admitting: Nurse Practitioner

## 2019-08-31 ENCOUNTER — Other Ambulatory Visit: Payer: Managed Care, Other (non HMO)

## 2019-08-31 ENCOUNTER — Other Ambulatory Visit: Payer: Self-pay

## 2019-08-31 DIAGNOSIS — R7989 Other specified abnormal findings of blood chemistry: Secondary | ICD-10-CM

## 2019-08-31 DIAGNOSIS — E559 Vitamin D deficiency, unspecified: Secondary | ICD-10-CM

## 2019-09-01 LAB — THYROID PANEL WITH TSH
Free Thyroxine Index: 1.9 (ref 1.2–4.9)
T3 Uptake Ratio: 23 % — ABNORMAL LOW (ref 24–39)
T4, Total: 8.4 ug/dL (ref 4.5–12.0)
TSH: 1.05 u[IU]/mL (ref 0.450–4.500)

## 2019-09-01 LAB — VITAMIN D 25 HYDROXY (VIT D DEFICIENCY, FRACTURES): Vit D, 25-Hydroxy: 30.7 ng/mL (ref 30.0–100.0)

## 2019-09-01 NOTE — Progress Notes (Signed)
Contacted via [place] afternoon Yelena.  Your labs have returned.  Thyroid labs have returned to good range with TSH 1.050 now.  No medications needed at this time and we will continue to monitor.  Vitamin D level has improved.  Woohoo.  Continue weekly Vitamin D until complete and then you can transition to over the counter Vitamin D3 1000 units daily, you can buy store brand.  Any questions? Keep being awesome!! Kindest regards, Marabeth Melland

## 2019-09-15 ENCOUNTER — Other Ambulatory Visit: Payer: Self-pay | Admitting: Nurse Practitioner

## 2019-09-15 ENCOUNTER — Encounter: Payer: Self-pay | Admitting: Nurse Practitioner

## 2019-09-15 ENCOUNTER — Other Ambulatory Visit: Payer: Self-pay

## 2019-09-15 ENCOUNTER — Ambulatory Visit (INDEPENDENT_AMBULATORY_CARE_PROVIDER_SITE_OTHER): Payer: 59 | Admitting: Nurse Practitioner

## 2019-09-15 VITALS — BP 134/86 | HR 86 | Temp 98.6°F | Wt 205.0 lb

## 2019-09-15 DIAGNOSIS — I10 Essential (primary) hypertension: Secondary | ICD-10-CM

## 2019-09-15 DIAGNOSIS — E669 Obesity, unspecified: Secondary | ICD-10-CM | POA: Diagnosis not present

## 2019-09-15 DIAGNOSIS — R109 Unspecified abdominal pain: Secondary | ICD-10-CM | POA: Diagnosis not present

## 2019-09-15 LAB — UA/M W/RFLX CULTURE, ROUTINE
Bilirubin, UA: NEGATIVE
Glucose, UA: NEGATIVE
Ketones, UA: NEGATIVE
Leukocytes,UA: NEGATIVE
Nitrite, UA: NEGATIVE
Protein,UA: NEGATIVE
RBC, UA: NEGATIVE
Specific Gravity, UA: 1.02 (ref 1.005–1.030)
Urobilinogen, Ur: 0.2 mg/dL (ref 0.2–1.0)
pH, UA: 6 (ref 5.0–7.5)

## 2019-09-15 LAB — WET PREP FOR TRICH, YEAST, CLUE
Clue Cell Exam: POSITIVE — AB
Trichomonas Exam: NEGATIVE
Yeast Exam: NEGATIVE

## 2019-09-15 MED ORDER — METRONIDAZOLE 500 MG PO TABS: 500.0000 mg | ORAL_TABLET | Freq: Two times a day (BID) | ORAL | 0 refills | Status: AC

## 2019-09-15 MED ORDER — FLUCONAZOLE 150 MG PO TABS
150.0000 mg | ORAL_TABLET | Freq: Once | ORAL | 0 refills | Status: AC
Start: 2019-09-15 — End: 2019-09-15

## 2019-09-15 MED ORDER — METHOCARBAMOL 500 MG PO TABS: 500.0000 mg | ORAL_TABLET | Freq: Three times a day (TID) | ORAL | 1 refills | Status: DC | PRN

## 2019-09-15 MED ORDER — LISINOPRIL 5 MG PO TABS
5.0000 mg | ORAL_TABLET | Freq: Every day | ORAL | 3 refills | Status: DC
Start: 2019-09-15 — End: 2020-08-16

## 2019-09-15 NOTE — Assessment & Plan Note (Signed)
Acute and ongoing for weeks.  Suspect musculoskeletal, UA negative and will obtain wet prep.  Exam with no red flags.  Will trial Robaxin as needed, script sent.  Recommend no use of Ibuprofen due to BP, but instead use Tylenol and alternate between ice and heat.  May use OTC creams like Voltaren gel.  Return to office for worsening or ongoing.

## 2019-09-15 NOTE — Progress Notes (Signed)
Contacted via [place] afternoon Chelcey, I attempted to call with wet prep results but mailbox was not allowing messages.  You are showing some clue cells on wet prep, meaning bacterial vaginosis.  Will need to treat and this is most likely cause of discomfort.  I will send in Flagyl by mouth for you to take, do not drink alcohol while taking this and complete full course.  Any questions? Keep being awesome!! Kindest regards, Jillisa Harris

## 2019-09-15 NOTE — Assessment & Plan Note (Signed)
Recommended eating smaller high protein, low fat meals more frequently and exercising 30 mins a day 5 times a week with a goal of 10-15lb weight loss in the next 3 months. Patient voiced their understanding and motivation to adhere to these recommendations.  

## 2019-09-15 NOTE — Patient Instructions (Signed)
Acute Back Pain, Adult Acute back pain is sudden and usually short-lived. It is often caused by an injury to the muscles and tissues in the back. The injury may result from:  A muscle or ligament getting overstretched or torn (strained). Ligaments are tissues that connect bones to each other. Lifting something improperly can cause a back strain.  Wear and tear (degeneration) of the spinal disks. Spinal disks are circular tissue that provides cushioning between the bones of the spine (vertebrae).  Twisting motions, such as while playing sports or doing yard work.  A hit to the back.  Arthritis. You may have a physical exam, lab tests, and imaging tests to find the cause of your pain. Acute back pain usually goes away with rest and home care. Follow these instructions at home: Managing pain, stiffness, and swelling  Take over-the-counter and prescription medicines only as told by your health care provider.  Your health care provider may recommend applying ice during the first 24-48 hours after your pain starts. To do this: ? Put ice in a plastic bag. ? Place a towel between your skin and the bag. ? Leave the ice on for 20 minutes, 2-3 times a day.  If directed, apply heat to the affected area as often as told by your health care provider. Use the heat source that your health care provider recommends, such as a moist heat pack or a heating pad. ? Place a towel between your skin and the heat source. ? Leave the heat on for 20-30 minutes. ? Remove the heat if your skin turns bright red. This is especially important if you are unable to feel pain, heat, or cold. You have a greater risk of getting burned. Activity   Do not stay in bed. Staying in bed for more than 1-2 days can delay your recovery.  Sit up and stand up straight. Avoid leaning forward when you sit, or hunching over when you stand. ? If you work at a desk, sit close to it so you do not need to lean over. Keep your chin tucked  in. Keep your neck drawn back, and keep your elbows bent at a right angle. Your arms should look like the letter "L." ? Sit high and close to the steering wheel when you drive. Add lower back (lumbar) support to your car seat, if needed.  Take short walks on even surfaces as soon as you are able. Try to increase the length of time you walk each day.  Do not sit, drive, or stand in one place for more than 30 minutes at a time. Sitting or standing for long periods of time can put stress on your back.  Do not drive or use heavy machinery while taking prescription pain medicine.  Use proper lifting techniques. When you bend and lift, use positions that put less stress on your back: ? Bend your knees. ? Keep the load close to your body. ? Avoid twisting.  Exercise regularly as told by your health care provider. Exercising helps your back heal faster and helps prevent back injuries by keeping muscles strong and flexible.  Work with a physical therapist to make a safe exercise program, as recommended by your health care provider. Do any exercises as told by your physical therapist. Lifestyle  Maintain a healthy weight. Extra weight puts stress on your back and makes it difficult to have good posture.  Avoid activities or situations that make you feel anxious or stressed. Stress and anxiety increase muscle   tension and can make back pain worse. Learn ways to manage anxiety and stress, such as through exercise. General instructions  Sleep on a firm mattress in a comfortable position. Try lying on your side with your knees slightly bent. If you lie on your back, put a pillow under your knees.  Follow your treatment plan as told by your health care provider. This may include: ? Cognitive or behavioral therapy. ? Acupuncture or massage therapy. ? Meditation or yoga. Contact a health care provider if:  You have pain that is not relieved with rest or medicine.  You have increasing pain going down  into your legs or buttocks.  Your pain does not improve after 2 weeks.  You have pain at night.  You lose weight without trying.  You have a fever or chills. Get help right away if:  You develop new bowel or bladder control problems.  You have unusual weakness or numbness in your arms or legs.  You develop nausea or vomiting.  You develop abdominal pain.  You feel faint. Summary  Acute back pain is sudden and usually short-lived.  Use proper lifting techniques. When you bend and lift, use positions that put less stress on your back.  Take over-the-counter and prescription medicines and apply heat or ice as directed by your health care provider. This information is not intended to replace advice given to you by your health care provider. Make sure you discuss any questions you have with your health care provider. Document Revised: 07/13/2018 Document Reviewed: 11/05/2016 Elsevier Patient Education  2020 [employer]  

## 2019-09-15 NOTE — Progress Notes (Signed)
BP 134/86 (BP Location: Left Arm)    Pulse 86    Temp 98.6 F (37 C) (Oral)    Wt 205 lb (93 kg)    LMP 08/09/2019 (Approximate)    SpO2 100%    BMI 32.11 kg/m    Subjective:    Patient ID: Kathleen [name], female    DOB: 1980/12/12, 39 y.o.   MRN: [id number]  HPI: Kathleen [name] is a 39 y.o. female  Chief Complaint  Patient presents with   Pain    pt states she has been having off and on right side pain, describes the pain as an uncomfortable feeling   BACK PAIN (RIGHT LOWER) Started having pain to lower right back, pressure feeling.  Ongoing for a few weeks, the other day had a bad day with it at work.  Does heavy lifting at work. Duration:weeks Onset: gradual Severity: 5/10 Quality: dull, aching, pressure like Location: right lower back Episode duration: varies -- minutes to hours Radiation: no Frequency: intermittent Alleviating factors:  Aggravating factors: Status: fluctuating Treatments attempted: Ibuprofen Fever: no Nausea: no Vomiting: no Weight loss: no Decreased appetite: no Diarrhea: no Constipation: no Blood in stool: no Heartburn: no Jaundice: no Rash: no Dysuria/urinary frequency: no Hematuria: no History of sexually transmitted disease: no Recurrent NSAID use: no   HYPERTENSION Currently not on medication.  In past HCTZ made her K+ low and Losartan made her tired. Hypertension status: uncontrolled  Satisfied with current treatment? yes Duration of hypertension: chronic BP monitoring frequency:  rarely BP range: 140-150/80 BP medication side effects:  no Medication compliance: poor compliance Aspirin: no Recurrent headaches: no Visual changes: no Palpitations: no Dyspnea: no Chest pain: no Lower extremity edema: no Dizzy/lightheaded: no  Relevant past medical, surgical, family and social history reviewed and updated as indicated. Interim medical history since our last visit reviewed. Allergies and medications reviewed and  updated.  Review of Systems  Constitutional: Negative for activity change, appetite change, diaphoresis, fatigue and fever.  Respiratory: Negative for cough, chest tightness and shortness of breath.   Cardiovascular: Negative for chest pain, palpitations and leg swelling.  Gastrointestinal: Negative.   Musculoskeletal: Positive for back pain.  Neurological: Negative.   Psychiatric/Behavioral: Negative.     Per HPI unless specifically indicated above     Objective:    BP 134/86 (BP Location: Left Arm)    Pulse 86    Temp 98.6 F (37 C) (Oral)    Wt 205 lb (93 kg)    LMP 08/09/2019 (Approximate)    SpO2 100%    BMI 32.11 kg/m   Wt Readings from Last 3 Encounters:  09/15/19 205 lb (93 kg)  08/15/19 205 lb (93 kg)  02/15/19 212 lb (96.2 kg)    Physical Exam Vitals and nursing note reviewed.  Constitutional:      General: She is awake. She is not in acute distress.    Appearance: She is well-developed and well-groomed. She is obese. She is not ill-appearing.  HENT:     Head: Normocephalic.     Right Ear: Hearing normal.     Left Ear: Hearing normal.  Eyes:     General: Lids are normal.        Right eye: No discharge.        Left eye: No discharge.     Conjunctiva/sclera: Conjunctivae normal.     Pupils: Pupils are equal, round, and reactive to light.  Neck:     Thyroid: No thyromegaly.  Vascular: No carotid bruit.  Cardiovascular:     Rate and Rhythm: Normal rate and regular rhythm.     Heart sounds: Normal heart sounds. No murmur heard.  No gallop.   Pulmonary:     Effort: Pulmonary effort is normal. No accessory muscle usage or respiratory distress.     Breath sounds: Normal breath sounds.  Abdominal:     General: Bowel sounds are normal.     Palpations: Abdomen is soft.  Musculoskeletal:     Cervical back: Normal range of motion and neck supple.     Lumbar back: Tenderness (to right lower near SI) present. No swelling, edema, lacerations or spasms. Normal range  of motion. Negative right straight leg raise test and negative left straight leg raise test.     Right lower leg: No edema.     Left lower leg: No edema.  Skin:    General: Skin is warm and dry.  Neurological:     Mental Status: She is alert and oriented to person, place, and time.  Psychiatric:        Attention and Perception: Attention normal.        Mood and Affect: Mood normal.        Speech: Speech normal.        Behavior: Behavior normal. Behavior is cooperative.        Thought Content: Thought content normal.     Results for orders placed or performed in visit on 08/31/19  VITAMIN D 25 Hydroxy (Vit-D Deficiency, Fractures)  Result Value Ref Range   Vit D, 25-Hydroxy 30.7 30.0 - 100.0 ng/mL  Thyroid Panel With TSH  Result Value Ref Range   TSH 1.050 0.450 - 4.500 uIU/mL   T4, Total 8.4 4.5 - 12.0 ug/dL   T3 Uptake Ratio 23 (L) 24 - 39 %   Free Thyroxine Index 1.9 1.2 - 4.9      Assessment & Plan:   Problem List Items Addressed This Visit      Cardiovascular and Mediastinum   Essential hypertension    Chronic, ongoing with initial BP elevated, but repeat improved, but still slightly above goal.  Will trial Lisinopril starting at 5 MG, script sent and educated patient on this + side effects to notify provider of.  Recommend continue checking BP regularly at home.  Return in 5 weeks for follow-up.      Relevant Medications   lisinopril (ZESTRIL) 5 MG tablet     Other   Obesity (BMI 30.0-34.9)    Recommended eating smaller high protein, low fat meals more frequently and exercising 30 mins a day 5 times a week with a goal of 10-15lb weight loss in the next 3 months. Patient voiced their understanding and motivation to adhere to these recommendations.       Acute right flank pain - Primary    Acute and ongoing for weeks.  Suspect musculoskeletal, UA negative and will obtain wet prep.  Exam with no red flags.  Will trial Robaxin as needed, script sent.  Recommend no use  of Ibuprofen due to BP, but instead use Tylenol and alternate between ice and heat.  May use OTC creams like Voltaren gel.  Return to office for worsening or ongoing.      Relevant Orders   UA/M w/rflx Culture, Routine   WET PREP FOR [place], YEAST, CLUE       Follow up plan: Return in about 5 weeks (around 10/20/2019) for HTN.

## 2019-09-15 NOTE — Assessment & Plan Note (Signed)
Chronic, ongoing with initial BP elevated, but repeat improved, but still slightly above goal.  Will trial Lisinopril starting at 5 MG, script sent and educated patient on this + side effects to notify provider of.  Recommend continue checking BP regularly at home.  Return in 5 weeks for follow-up.

## 2019-10-21 ENCOUNTER — Ambulatory Visit: Payer: 59 | Admitting: Nurse Practitioner

## 2019-10-24 ENCOUNTER — Encounter: Payer: Self-pay | Admitting: Nurse Practitioner

## 2019-10-24 ENCOUNTER — Other Ambulatory Visit: Payer: Self-pay

## 2019-10-24 ENCOUNTER — Ambulatory Visit (INDEPENDENT_AMBULATORY_CARE_PROVIDER_SITE_OTHER): Payer: 59 | Admitting: Nurse Practitioner

## 2019-10-24 VITALS — BP 126/80 | HR 78 | Temp 98.4°F | Wt 202.4 lb

## 2019-10-24 DIAGNOSIS — E66811 Obesity, class 1: Secondary | ICD-10-CM

## 2019-10-24 DIAGNOSIS — E669 Obesity, unspecified: Secondary | ICD-10-CM | POA: Diagnosis not present

## 2019-10-24 DIAGNOSIS — I10 Essential (primary) hypertension: Secondary | ICD-10-CM | POA: Diagnosis not present

## 2019-10-24 NOTE — Patient Instructions (Addendum)
VITAMIN D3 1000 units daily and Melatonin for sleep.  Vitamin B12 1000 MCG daily and Zinc daily.   DASH Eating Plan DASH stands for "Dietary Approaches to Stop Hypertension." The DASH eating plan is a healthy eating plan that has been shown to reduce high blood pressure (hypertension). It may also reduce your risk for type 2 diabetes, heart disease, and stroke. The DASH eating plan may also help with weight loss. What are tips for following this plan?  General guidelines  Avoid eating more than 2,300 mg (milligrams) of salt (sodium) a day. If you have hypertension, you may need to reduce your sodium intake to 1,500 mg a day.  Limit alcohol intake to no more than 1 drink a day for nonpregnant women and 2 drinks a day for men. One drink equals 12 oz of beer, 5 oz of wine, or 1 oz of hard liquor.  Work with your health care provider to maintain a healthy body weight or to lose weight. Ask what an ideal weight is for you.  Get at least 30 minutes of exercise that causes your heart to beat faster (aerobic exercise) most days of the week. Activities may include walking, swimming, or biking.  Work with your health care provider or diet and nutrition specialist (dietitian) to adjust your eating plan to your individual calorie needs. Reading food labels   Check food labels for the amount of sodium per serving. Choose foods with less than 5 percent of the Daily Value of sodium. Generally, foods with less than 300 mg of sodium per serving fit into this eating plan.  To find whole grains, look for the word "whole" as the first word in the ingredient list. Shopping  Buy products labeled as "low-sodium" or "no salt added."  Buy fresh foods. Avoid canned foods and premade or frozen meals. Cooking  Avoid adding salt when cooking. Use salt-free seasonings or herbs instead of table salt or sea salt. Check with your health care provider or pharmacist before using salt substitutes.  Do not fry foods.  Cook foods using healthy methods such as baking, boiling, grilling, and broiling instead.  Cook with heart-healthy oils, such as olive, canola, soybean, or sunflower oil. Meal planning  Eat a balanced diet that includes: ? 5 or more servings of fruits and vegetables each day. At each meal, try to fill half of your plate with fruits and vegetables. ? Up to 6-8 servings of whole grains each day. ? Less than 6 oz of lean meat, poultry, or fish each day. A 3-oz serving of meat is about the same size as a deck of cards. One egg equals 1 oz. ? 2 servings of low-fat dairy each day. ? A serving of nuts, seeds, or beans 5 times each week. ? Heart-healthy fats. Healthy fats called Omega-3 fatty acids are found in foods such as flaxseeds and coldwater fish, like sardines, salmon, and mackerel.  Limit how much you eat of the following: ? Canned or prepackaged foods. ? Food that is high in trans fat, such as fried foods. ? Food that is high in saturated fat, such as fatty meat. ? Sweets, desserts, sugary drinks, and other foods with added sugar. ? Full-fat dairy products.  Do not salt foods before eating.  Try to eat at least 2 vegetarian meals each week.  Eat more home-cooked food and less restaurant, buffet, and fast food.  When eating at a restaurant, ask that your food be prepared with less salt or no salt,  if possible. What foods are recommended? The items listed may not be a complete list. Talk with your dietitian about what dietary choices are best for you. Grains Whole-grain or whole-wheat bread. Whole-grain or whole-wheat pasta. Brown rice. [place]. Bulgur. Whole-grain and low-sodium cereals. Pita bread. Low-fat, low-sodium crackers. Whole-wheat flour tortillas. Vegetables Fresh or frozen vegetables (raw, steamed, roasted, or grilled). Low-sodium or reduced-sodium tomato and vegetable juice. Low-sodium or reduced-sodium tomato sauce and tomato paste. Low-sodium or reduced-sodium  canned vegetables. Fruits All fresh, dried, or frozen fruit. Canned fruit in natural juice (without added sugar). Meat and other protein foods Skinless chicken or [place]. Ground chicken or [place]. Pork with fat trimmed off. Fish and seafood. Egg whites. Dried beans, peas, or lentils. Unsalted nuts, nut butters, and seeds. Unsalted canned beans. Lean cuts of beef with fat trimmed off. Low-sodium, lean deli meat. Dairy Low-fat (1%) or fat-free (skim) milk. Fat-free, low-fat, or reduced-fat cheeses. Nonfat, low-sodium ricotta or cottage cheese. Low-fat or nonfat yogurt. Low-fat, low-sodium cheese. Fats and oils Soft margarine without trans fats. Vegetable oil. Low-fat, reduced-fat, or light mayonnaise and salad dressings (reduced-sodium). Canola, safflower, olive, soybean, and sunflower oils. Avocado. Seasoning and other foods Herbs. Spices. Seasoning mixes without salt. Unsalted popcorn and pretzels. Fat-free sweets. What foods are not recommended? The items listed may not be a complete list. Talk with your dietitian about what dietary choices are best for you. Grains Baked goods made with fat, such as croissants, muffins, or some breads. Dry pasta or rice meal packs. Vegetables Creamed or fried vegetables. Vegetables in a cheese sauce. Regular canned vegetables (not low-sodium or reduced-sodium). Regular canned tomato sauce and paste (not low-sodium or reduced-sodium). Regular tomato and vegetable juice (not low-sodium or reduced-sodium). Angie Fava. Olives. Fruits Canned fruit in a light or heavy syrup. Fried fruit. Fruit in cream or butter sauce. Meat and other protein foods Fatty cuts of meat. Ribs. Fried meat. Berniece Salines. Sausage. Bologna and other processed lunch meats. Salami. Fatback. Hotdogs. Bratwurst. Salted nuts and seeds. Canned beans with added salt. Canned or smoked fish. Whole eggs or egg yolks. Chicken or [place] with skin. Dairy Whole or 2% milk, cream, and half-and-half. Whole or  full-fat cream cheese. Whole-fat or sweetened yogurt. Full-fat cheese. Nondairy creamers. Whipped toppings. Processed cheese and cheese spreads. Fats and oils Butter. Stick margarine. Lard. Shortening. Ghee. Bacon fat. Tropical oils, such as coconut, palm kernel, or palm oil. Seasoning and other foods Salted popcorn and pretzels. Onion salt, garlic salt, seasoned salt, table salt, and sea salt. Worcestershire sauce. Tartar sauce. Barbecue sauce. Teriyaki sauce. Soy sauce, including reduced-sodium. Steak sauce. Canned and packaged gravies. Fish sauce. Oyster sauce. Cocktail sauce. Horseradish that you find on the shelf. Ketchup. Mustard. Meat flavorings and tenderizers. Bouillon cubes. Hot sauce and Tabasco sauce. Premade or packaged marinades. Premade or packaged taco seasonings. Relishes. Regular salad dressings. Where to find more information:  National Heart, Lung, and [place]: https://wilson-eaton.com/  American Heart Association: www.heart.org Summary  The DASH eating plan is a healthy eating plan that has been shown to reduce high blood pressure (hypertension). It may also reduce your risk for type 2 diabetes, heart disease, and stroke.  With the DASH eating plan, you should limit salt (sodium) intake to 2,300 mg a day. If you have hypertension, you may need to reduce your sodium intake to 1,500 mg a day.  When on the DASH eating plan, aim to eat more fresh fruits and vegetables, whole grains, lean proteins, low-fat dairy, and heart-healthy fats.  Work with your health care provider or diet and nutrition specialist (dietitian) to adjust your eating plan to your individual calorie needs. This information is not intended to replace advice given to you by your health care provider. Make sure you discuss any questions you have with your health care provider. Document Revised: 03/06/2017 Document Reviewed: 03/17/2016 Elsevier Patient Education  2020 [employer].

## 2019-10-24 NOTE — Progress Notes (Signed)
BP 126/80   Pulse 78   Temp 98.4 F (36.9 C) (Oral)   Wt 202 lb 6.4 oz (91.8 kg)   LMP 10/16/2019 (Exact Date)   SpO2 97%   BMI 31.70 kg/m    Subjective:    Patient ID: Kathleen [name], female    DOB: Jun 03, 1980, 39 y.o.   MRN: [id number]  HPI: GIABELLA DUHART is a 39 y.o. female  Chief Complaint  Patient presents with  . Hypertension    5 week f/up   HYPERTENSION Continues on Lisinopril 5 MG daily (restarted 09/15/19) and is focused on diet and exercise at home.  Denies any side effects with Lisinopril restart. In past have tried Lisinopril and HCTZ, with HCTZ causing hypokalemia and reports of Lisinopril causing fatigue.  Hypertension status: stable  Satisfied with current treatment? yes Duration of hypertension: chronic BP monitoring frequency:  not checking BP range:  BP medication side effects:  no Medication compliance: good compliance Aspirin: no Recurrent headaches: no Visual changes: no Palpitations: no Dyspnea: no Chest pain: no Lower extremity edema: no Dizzy/lightheaded: no  Relevant past medical, surgical, family and social history reviewed and updated as indicated. Interim medical history since our last visit reviewed. Allergies and medications reviewed and updated.  Review of Systems  Constitutional: Negative for activity change, appetite change, diaphoresis, fatigue and fever.  Respiratory: Negative for cough, chest tightness and shortness of breath.   Cardiovascular: Negative for chest pain, palpitations and leg swelling.  Gastrointestinal: Negative.   Neurological: Negative.   Psychiatric/Behavioral: Negative.     Per HPI unless specifically indicated above     Objective:    BP 126/80   Pulse 78   Temp 98.4 F (36.9 C) (Oral)   Wt 202 lb 6.4 oz (91.8 kg)   LMP 10/16/2019 (Exact Date)   SpO2 97%   BMI 31.70 kg/m   Wt Readings from Last 3 Encounters:  10/24/19 202 lb 6.4 oz (91.8 kg)  09/15/19 205 lb (93 kg)  08/15/19 205 lb  (93 kg)    Physical Exam Vitals and nursing note reviewed.  Constitutional:      General: She is awake. She is not in acute distress.    Appearance: She is well-developed, well-groomed and overweight. She is not ill-appearing.  HENT:     Head: Normocephalic.     Right Ear: Hearing normal.     Left Ear: Hearing normal.  Eyes:     General: Lids are normal.        Right eye: No discharge.        Left eye: No discharge.     Conjunctiva/sclera: Conjunctivae normal.     Pupils: Pupils are equal, round, and reactive to light.  Neck:     Thyroid: No thyromegaly.     Vascular: No carotid bruit.  Cardiovascular:     Rate and Rhythm: Normal rate and regular rhythm.     Heart sounds: Normal heart sounds. No murmur heard.  No gallop.   Pulmonary:     Effort: Pulmonary effort is normal. No accessory muscle usage or respiratory distress.     Breath sounds: Normal breath sounds.  Abdominal:     General: Bowel sounds are normal.     Palpations: Abdomen is soft.  Musculoskeletal:     Cervical back: Normal range of motion and neck supple.     Right lower leg: No edema.     Left lower leg: No edema.  Skin:    General: Skin  is warm and dry.  Neurological:     Mental Status: She is alert and oriented to person, place, and time.  Psychiatric:        Attention and Perception: Attention normal.        Mood and Affect: Mood normal.        Speech: Speech normal.        Behavior: Behavior normal. Behavior is cooperative.        Thought Content: Thought content normal.     Results for orders placed or performed in visit on 09/15/19  WET PREP FOR [place], YEAST, CLUE   Specimen: Vaginal Fluid   Vaginal Flui  Result Value Ref Range   Trichomonas Exam Negative Negative   Yeast Exam Negative Negative   Clue Cell Exam Positive (A) Negative  UA/M w/rflx Culture, Routine   Specimen: Urine   Urine  Result Value Ref Range   Specific Gravity, UA 1.020 1.005 - 1.030   pH, UA 6.0 5.0 - 7.5   Color,  UA Yellow Yellow   Appearance Ur Clear Clear   Leukocytes,UA Negative Negative   Protein,UA Negative Negative/Trace   Glucose, UA Negative Negative   Ketones, UA Negative Negative   RBC, UA Negative Negative   Bilirubin, UA Negative Negative   Urobilinogen, Ur 0.2 0.2 - 1.0 mg/dL   Nitrite, UA Negative Negative      Assessment & Plan:   Problem List Items Addressed This Visit      Cardiovascular and Mediastinum   Essential hypertension - Primary    Chronic, ongoing with initial BP mild elevated, but repeat improved.  Continue Lisinopril 5 MG daily and adjust as needed.  Recommend continue checking BP regularly at home.  Focus on DASH diet.  BMP today.  Return in 6 months.      Relevant Orders   Basic metabolic panel     Other   Obesity (BMI 30.0-34.9)    Recommended eating smaller high protein, low fat meals more frequently and exercising 30 mins a day 5 times a week with a goal of 10-15lb weight loss in the next 3 months. Patient voiced their understanding and motivation to adhere to these recommendations.           Follow up plan: Return in about 6 months (around 04/25/2020) for HTN.

## 2019-10-24 NOTE — Assessment & Plan Note (Signed)
Recommended eating smaller high protein, low fat meals more frequently and exercising 30 mins a day 5 times a week with a goal of 10-15lb weight loss in the next 3 months. Patient voiced their understanding and motivation to adhere to these recommendations.  

## 2019-10-24 NOTE — Assessment & Plan Note (Signed)
Chronic, ongoing with initial BP mild elevated, but repeat improved.  Continue Lisinopril 5 MG daily and adjust as needed.  Recommend continue checking BP regularly at home.  Focus on DASH diet.  BMP today.  Return in 6 months.

## 2019-10-25 LAB — BASIC METABOLIC PANEL
BUN/Creatinine Ratio: 14 (ref 9–23)
BUN: 12 mg/dL (ref 6–20)
CO2: 23 mmol/L (ref 20–29)
Calcium: 9.3 mg/dL (ref 8.7–10.2)
Chloride: 105 mmol/L (ref 96–106)
Creatinine, Ser: 0.84 mg/dL (ref 0.57–1.00)
GFR calc Af Amer: 103 mL/min/{1.73_m2} (ref >59)
GFR calc non Af Amer: 89 mL/min/{1.73_m2} (ref >59)
Glucose: 96 mg/dL (ref 65–99)
Potassium: 4.4 mmol/L (ref 3.5–5.2)
Sodium: 141 mmol/L (ref 134–144)

## 2019-10-25 NOTE — Progress Notes (Signed)
Contacted via MyChart  Good morning Kathleen [name]!!  Your electrolytes and kidney function continue to look great.  Keep on current medication regimen.  Have a great day!! Keep being awesome!! Kindest regards, Debralee Braaksma

## 2019-11-03 ENCOUNTER — Telehealth (INDEPENDENT_AMBULATORY_CARE_PROVIDER_SITE_OTHER): Payer: 59 | Admitting: Nurse Practitioner

## 2019-11-03 ENCOUNTER — Encounter: Payer: Self-pay | Admitting: Nurse Practitioner

## 2019-11-03 VITALS — Wt 202.0 lb

## 2019-11-03 DIAGNOSIS — R0982 Postnasal drip: Secondary | ICD-10-CM | POA: Diagnosis not present

## 2019-11-03 MED ORDER — FLUTICASONE PROPIONATE 50 MCG/ACT NA SUSP
2.0000 | Freq: Every day | NASAL | 2 refills | Status: DC
Start: 2019-11-03 — End: 2020-08-16

## 2019-11-03 MED ORDER — CETIRIZINE HCL 10 MG PO TABS
10.0000 mg | ORAL_TABLET | Freq: Every day | ORAL | 1 refills | Status: DC
Start: 2019-11-03 — End: 2020-08-16

## 2019-11-03 NOTE — Patient Instructions (Signed)
Nice meeting you virtually, Kathleen [name]!  I have sent the cetirizine and flonase over to your pharmacy - can also try loratadine instead of cetirizine.  Be sure to let us know if your symptoms are not improving within the next week.   Take care.   Postnasal Drip Postnasal drip is the feeling of mucus going down the back of your throat. Mucus is a slimy substance that moistens and cleans your nose and throat, as well as the air pockets in face bones near your forehead and cheeks (sinuses). Small amounts of mucus pass from your nose and sinuses down the back of your throat all the time. This is normal. When you produce too much mucus or the mucus gets too thick, you can feel it. Some common causes of postnasal drip include:  Having more mucus because of: ? A cold or the flu. ? Allergies. ? Cold air. ? Certain medicines.  Having more mucus that is thicker because of: ? A sinus or nasal infection. ? Dry air. ? A food allergy. Follow these instructions at home: Relieving discomfort   Gargle with a salt-water mixture 3-4 times a day or as needed. To make a salt-water mixture, completely dissolve -1 tsp of salt in 1 cup of warm water.  If the air in your home is dry, use a humidifier to add moisture to the air.  Use a saline spray or container (neti pot) to flush out the nose (nasal irrigation). These methods can help clear away mucus and keep the nasal passages moist. General instructions  Take over-the-counter and prescription medicines only as told by your health care provider.  Follow instructions from your health care provider about eating or drinking restrictions. You may need to avoid caffeine.  Avoid things that you know you are allergic to (allergens), like dust, mold, pollen, pets, or certain foods.  Drink enough fluid to keep your urine pale yellow.  Keep all follow-up visits as told by your health care provider. This is important. Contact a health care provider  if:  You have a fever.  You have a sore throat.  You have difficulty swallowing.  You have headache.  You have sinus pain.  You have a cough that does not go away.  The mucus from your nose becomes thick and is green or yellow in color.  You have cold or flu symptoms that last more than 10 days. Summary  Postnasal drip is the feeling of mucus going down the back of your throat.  If your health care provider approves, use nasal irrigation or a nasal spray 2?4 times a day.  Avoid things that you know you are allergic to (allergens), like dust, mold, pollen, pets, or certain foods. This information is not intended to replace advice given to you by your health care provider. Make sure you discuss any questions you have with your health care provider. Document Revised: 07/16/2018 Document Reviewed: 07/07/2016 Elsevier Patient Education  [place].

## 2019-11-03 NOTE — Progress Notes (Addendum)
Wt 202 lb (91.6 kg)    LMP 10/16/2019 (Exact Date)    BMI 31.64 kg/m    Subjective:    Patient ID: Kathleen [name], female    DOB: [birth year], 39 y.o.   MRN: [id number]  HPI: Kathleen [name] is a 39 y.o. female presenting with upper respiratory symptoms.  Chief Complaint  Patient presents with   Sinusitis    drainage and mucus behind the throat  x a few days. has tried OTC medications   UPPER RESPIRATORY TRACT INFECTION Started near end of last week with pressure.  Reports pressure is gone, now has funny taste. Worst symptom: funny taste Fever: no Cough: no Shortness of breath: no Wheezing: no Chest pain: no Chest tightness: no Chest congestion: no Nasal congestion: yes Runny nose: no Post nasal drip: yes Sneezing: no Sore throat: yes Swollen glands: no Sinus pressure: no Headache: no Face pain: no Toothache: no Ear pain: no  Ear pressure: no  Eyes red/itching:no Eye drainage/crusting: no  Nausea: no Vomiting: no Rash: no Fatigue: no Sick contacts: no Strep contacts: no  Context: worse Recurrent sinusitis: no Relief with OTC cold/cough medications: tried severe Tylenol sinus and congestion; Nyquil vapo cool, some relief Treatments attempted: severe Tylenol sinus, Nyquil vapo cool  No Known Allergies  Outpatient Encounter Medications as of 11/03/2019  Medication Sig   EPINEPHrine 0.3 mg/0.3 mL IJ SOAJ injection Inject 0.3 mg into the muscle as needed.    lisinopril (ZESTRIL) 5 MG tablet Take 1 tablet (5 mg total) by mouth daily.   methocarbamol (ROBAXIN) 500 MG tablet Take 1 tablet (500 mg total) by mouth every 8 (eight) hours as needed for muscle spasms.   cetirizine (ZYRTEC) 10 MG tablet Take 1 tablet (10 mg total) by mouth daily.   fluticasone (FLONASE) 50 MCG/ACT nasal spray Place 2 sprays into both nostrils daily.   No facility-administered encounter medications on file as of 11/03/2019.   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 No pertinent past medical history.  Relevant past medical, surgical, family and social history reviewed and updated as indicated. Interim medical history since our last visit reviewed.  Review of Systems  Constitutional: Negative.  Negative for activity change, appetite change, fatigue and fever.  HENT: Positive for congestion, postnasal drip and sore throat. Negative for ear discharge, ear pain, rhinorrhea, sinus pressure, sinus pain, sneezing, trouble swallowing and voice change.   Respiratory: Negative.  Negative for cough, chest tightness, shortness of breath and wheezing.   Cardiovascular: Negative.  Negative for chest pain and palpitations.  Gastrointestinal: Negative.  Negative for nausea and vomiting.  Musculoskeletal: Negative.   Skin: Negative.  Negative for rash.  Neurological: Negative.  Negative for headaches.  Psychiatric/Behavioral: Negative.  Negative for confusion and sleep disturbance. The patient is not nervous/anxious.     Per HPI unless specifically indicated above     Objective:    Wt 202 lb (91.6 kg)    LMP 10/16/2019 (Exact Date)    BMI 31.64 kg/m   Wt Readings from Last 3 Encounters:  11/03/19 202 lb (91.6 kg)  10/24/19 202 lb 6.4 oz (91.8 kg)  09/15/19 205 lb (93 kg)    Physical Exam Vitals and nursing note reviewed.  Constitutional:      General: She is not in acute distress.    Appearance: Normal appearance. She is not ill-appearing, toxic-appearing or diaphoretic.  HENT:     Head: Normocephalic  and atraumatic.     Nose: Congestion present. No rhinorrhea.     Mouth/Throat:     Mouth: Mucous membranes are moist.     Pharynx: Oropharynx is clear. Posterior oropharyngeal erythema present. No oropharyngeal exudate.  Eyes:     General: No scleral icterus.    Extraocular Movements: Extraocular movements intact.  Cardiovascular:     Comments: Unable to assess heart sounds via virtual visit Pulmonary:     Effort: Pulmonary effort is normal. No respiratory distress.     Comments: Unable to assess lung sounds via virtual visit Abdominal:     Comments: Unable to assess abdomen via virtual visit  Skin:    Coloration: Skin is not jaundiced or pale.  Neurological:     General: No focal deficit present.     Mental Status: She is alert and oriented to person, place, and time.  Psychiatric:        Mood and Affect: Mood normal.        Behavior: Behavior normal.        Thought Content: Thought content normal.        Judgment: Judgment normal.       Assessment & Plan:   Problem List Items Addressed This Visit      Other   Post-nasal drip - Primary    Acute, ongoing.  Likely either post-viral or related to allergies, no s/s active bacterial infection.  Will treat with non-drowsy antihistamine daily and intra-nasal steroid spray.  Return to clinic if not better in ~1 week.           Follow up plan: Return if symptoms worsen or fail to improve.  Due to the catastrophic nature of the COVID-19 pandemic, this visit was completed via audio and visual contact via Mychart due to the restrictions of the COVID-19 pandemic. All issues as above were discussed and addressed. Physical exam was done as above through visual confirmation on Mychart. If it was felt that the patient should be evaluated in the office, they were directed there. The patient verbally consented to this visit."}  Location of the patient: home  Location of the provider: work  Those involved with this call:   Provider:  Carnella Gu[place], DNP  CMA: Lesle Chris, CMA  Front Desk/Registration: [facility]   Time spent on call: 17 minutes on the phone discussing health concerns. 20 minutes total spent in review of patient's record and preparation of their chart.  I verified patient identity using two factors (patient name and date of birth). Patient consents verbally to being seen via telemedicine visit today.

## 2019-11-03 NOTE — Assessment & Plan Note (Signed)
Acute, ongoing.  Likely either post-viral or related to allergies, no s/s active bacterial infection.  Will treat with non-drowsy antihistamine daily and intra-nasal steroid spray.  Return to clinic if not better in ~1 week.

## 2020-01-18 ENCOUNTER — Ambulatory Visit: Payer: 59 | Admitting: Nurse Practitioner

## 2020-02-02 ENCOUNTER — Other Ambulatory Visit: Payer: Self-pay

## 2020-02-02 ENCOUNTER — Encounter: Payer: Self-pay | Admitting: Nurse Practitioner

## 2020-02-02 ENCOUNTER — Ambulatory Visit (INDEPENDENT_AMBULATORY_CARE_PROVIDER_SITE_OTHER): Payer: 59 | Admitting: Nurse Practitioner

## 2020-02-02 VITALS — BP 122/78 | HR 96 | Temp 98.2°F | Resp 16 | Ht 66.0 in | Wt 202.0 lb

## 2020-02-02 DIAGNOSIS — R1901 Right upper quadrant abdominal swelling, mass and lump: Secondary | ICD-10-CM | POA: Diagnosis not present

## 2020-02-02 DIAGNOSIS — D171 Benign lipomatous neoplasm of skin and subcutaneous tissue of trunk: Secondary | ICD-10-CM | POA: Insufficient documentation

## 2020-02-02 DIAGNOSIS — R1084 Generalized abdominal pain: Secondary | ICD-10-CM | POA: Insufficient documentation

## 2020-02-02 LAB — UA/M W/RFLX CULTURE, ROUTINE
Bilirubin, UA: NEGATIVE
Glucose, UA: NEGATIVE
Ketones, UA: NEGATIVE
Leukocytes,UA: NEGATIVE
Nitrite, UA: NEGATIVE
Protein,UA: NEGATIVE
RBC, UA: NEGATIVE
Specific Gravity, UA: 1.01 (ref 1.005–1.030)
Urobilinogen, Ur: 0.2 mg/dL (ref 0.2–1.0)
pH, UA: 6 (ref 5.0–7.5)

## 2020-02-02 LAB — WET PREP FOR TRICH, YEAST, CLUE
Clue Cell Exam: POSITIVE — AB
Trichomonas Exam: NEGATIVE
Yeast Exam: NEGATIVE

## 2020-02-02 MED ORDER — FLUCONAZOLE 150 MG PO TABS
150.0000 mg | ORAL_TABLET | Freq: Once | ORAL | 0 refills | Status: AC
Start: 2020-02-02 — End: 2020-02-02

## 2020-02-02 MED ORDER — METRONIDAZOLE 500 MG PO TABS
500.0000 mg | ORAL_TABLET | Freq: Two times a day (BID) | ORAL | 0 refills | Status: AC
Start: 2020-02-02 — End: 2020-02-09

## 2020-02-02 NOTE — Assessment & Plan Note (Signed)
Acute x 2 days.  UA negative.  Wet prep + clue cells, neg yeast and trich.  Script for Flagyl sent, along with Diflucan.  Recommend avoid alcohol use with this and to take abx until course complete.  Recommend good hygiene at home and good hand washing prior to partner performing stimulation with intercourse.  Return for worsening or ongoing symptoms.

## 2020-02-02 NOTE — Patient Instructions (Signed)

## 2020-02-02 NOTE — Progress Notes (Signed)
BP 122/78 (BP Location: Left Arm)   Pulse 96   Temp 98.2 F (36.8 C) (Oral)   Resp 16   Ht 5\' 6"  (1.676 m)   Wt 202 lb (91.6 kg)   LMP 01/19/2020 (Approximate)   BMI 32.60 kg/m    Subjective:    Patient ID: [name], female    DOB: 1980/10/08, 39 y.o.   MRN: [id number]  HPI: WYNONIA MEDERO is a 39 y.o. female  Chief Complaint  Patient presents with  . Flank Pain    right side on and off x several months.   . Abdominal Pain    lower x 2 days   ABDOMINAL PAIN  Reports flank pain since June on and off.  Then recently abdominal pain, started 2 days ago, to suprapubic area.  Denies any vaginal discharge or odor.  Treated for BV last in June. She reports noticing a lump to right side abdomen upper.  Recent AST/ALT 10/6. Duration:months Onset: gradual Severity: 5/10 Quality: aching and cramping Location:  RLQ and suprapubic". "lower abdominal quadrants  Episode duration:  Radiation: no Frequency: intermittent Alleviating factors: nothing she can think  Aggravating factors: touching her suprapubic area makes worse Status: fluctuating Treatments attempted: none Fever: no Nausea: no Vomiting: no Weight loss: no Decreased appetite: no Diarrhea: no Constipation: no -- has BM every 2-3 days, no straining Blood in stool: no Heartburn: no Jaundice: no Rash: no Dysuria/urinary frequency: no Hematuria: no History of sexually transmitted disease: no Recurrent NSAID use: no  Relevant past medical, surgical, family and social history reviewed and updated as indicated. Interim medical history since our last visit reviewed. Allergies and medications reviewed and updated.  Review of Systems  Constitutional: Negative for activity change, appetite change, diaphoresis, fatigue and fever.  Respiratory: Negative for cough, chest tightness and shortness of breath.   Cardiovascular: Negative for chest pain, palpitations and leg swelling.  Gastrointestinal: Positive for  abdominal pain. Negative for abdominal distention, constipation, diarrhea, nausea and vomiting.  Endocrine: Negative.   Neurological: Negative.   Psychiatric/Behavioral: Negative.     Per HPI unless specifically indicated above     Objective:    BP 122/78 (BP Location: Left Arm)   Pulse 96   Temp 98.2 F (36.8 C) (Oral)   Resp 16   Ht 5\' 6"  (1.676 m)   Wt 202 lb (91.6 kg)   LMP 01/19/2020 (Approximate)   BMI 32.60 kg/m   Wt Readings from Last 3 Encounters:  02/02/20 202 lb (91.6 kg)  11/03/19 202 lb (91.6 kg)  10/24/19 202 lb 6.4 oz (91.8 kg)    Physical Exam Vitals and nursing note reviewed.  Constitutional:      General: She is awake. She is not in acute distress.    Appearance: She is well-developed and well-groomed. She is obese. She is not ill-appearing.  HENT:     Head: Normocephalic.     Right Ear: Hearing normal.     Left Ear: Hearing normal.  Eyes:     General: Lids are normal.        Right eye: No discharge.        Left eye: No discharge.     Conjunctiva/sclera: Conjunctivae normal.     Pupils: Pupils are equal, round, and reactive to light.  Neck:     Thyroid: No thyromegaly.     Vascular: No carotid bruit.  Cardiovascular:     Rate and Rhythm: Normal rate and regular rhythm.  Heart sounds: Normal heart sounds. No murmur heard.  No gallop.   Pulmonary:     Effort: Pulmonary effort is normal. No accessory muscle usage or respiratory distress.     Breath sounds: Normal breath sounds.  Abdominal:     General: Bowel sounds are normal. There is no distension.     Palpations: Abdomen is soft. There is mass. There is no hepatomegaly or splenomegaly.     Tenderness: There is abdominal tenderness in the suprapubic area. There is no right CVA tenderness, left CVA tenderness or guarding.     Hernia: No hernia is present.    Musculoskeletal:     Cervical back: Normal range of motion and neck supple.     Right lower leg: No edema.     Left lower leg: No  edema.  Lymphadenopathy:     Cervical: No cervical adenopathy.  Skin:    General: Skin is warm and dry.  Neurological:     Mental Status: She is alert and oriented to person, place, and time.  Psychiatric:        Attention and Perception: Attention normal.        Mood and Affect: Mood normal.        Speech: Speech normal.        Behavior: Behavior normal. Behavior is cooperative.        Thought Content: Thought content normal.     Results for orders placed or performed in visit on 53/29/92  Basic metabolic panel  Result Value Ref Range   Glucose 96 65 - 99 mg/dL   BUN 12 6 - 20 mg/dL   Creatinine, Ser 0.84 0.57 - 1.00 mg/dL   GFR calc non Af Amer 89 >59 mL/min/1.73   GFR calc Af Amer 103 >59 mL/min/1.73   BUN/Creatinine Ratio 14 9 - 23   Sodium 141 134 - 144 mmol/L   Potassium 4.4 3.5 - 5.2 mmol/L   Chloride 105 96 - 106 mmol/L   CO2 23 20 - 29 mmol/L   Calcium 9.3 8.7 - 10.2 mg/dL      Assessment & Plan:   Problem List Items Addressed This Visit      Other   Right upper quadrant abdominal mass    Firm, mobile, suspect lipoma or cyst.  Will obtain ultrasound of area, since this is causing occasional discomfort for her.  Return to office for worsening or ongoing symptoms.      Relevant Orders   US Abdomen Limited RUQ (LIVER/GB)   Generalized abdominal pain - Primary    Acute x 2 days.  UA negative.  Wet prep + clue cells, neg yeast and trich.  Script for Flagyl sent, along with Diflucan.  Recommend avoid alcohol use with this and to take abx until course complete.  Recommend good hygiene at home and good hand washing prior to partner performing stimulation with intercourse.  Return for worsening or ongoing symptoms.      Relevant Orders   UA/M w/rflx Culture, Routine   WET PREP FOR [place], YEAST, CLUE       Follow up plan: Return in about 6 months (around 08/15/2020) for annual physical.

## 2020-02-02 NOTE — Assessment & Plan Note (Signed)
Firm, mobile, suspect lipoma or cyst.  Will obtain ultrasound of area, since this is causing occasional discomfort for her.  Return to office for worsening or ongoing symptoms.

## 2020-02-13 ENCOUNTER — Other Ambulatory Visit: Payer: Self-pay

## 2020-02-13 ENCOUNTER — Ambulatory Visit
Admission: RE | Admit: 2020-02-13 | Discharge: 2020-02-13 | Disposition: A | Discharge status: Home / Self Care | Payer: 59 | Source: Ambulatory Visit | Attending: Nurse Practitioner | Admitting: Nurse Practitioner

## 2020-02-13 ENCOUNTER — Other Ambulatory Visit: Payer: Self-pay | Admitting: Nurse Practitioner

## 2020-02-13 DIAGNOSIS — R1901 Right upper quadrant abdominal swelling, mass and lump: Secondary | ICD-10-CM | POA: Diagnosis not present

## 2020-02-13 NOTE — Progress Notes (Signed)
Contacted via [facility], your ultrasound has returned.  It looks like the area in your right upper quadrant could be as we discussed, a lipoma.  Basically a fat filled cyst.  We could consider CT to further look at this if ongoing discomfort or get you in with general surgery to discuss surgical options for removal.  Let me know if any questions or concerns. Keep being awesome!!  Thank you for allowing me to participate in your care. Kindest regards, Cartina Brousseau

## 2020-02-15 ENCOUNTER — Ambulatory Visit: Payer: 59 | Admitting: Nurse Practitioner

## 2020-04-26 ENCOUNTER — Ambulatory Visit: Payer: 59 | Admitting: Nurse Practitioner

## 2020-07-06 ENCOUNTER — Ambulatory Visit
Admission: EM | Admit: 2020-07-06 | Discharge: 2020-07-06 | Disposition: A | Discharge status: Home / Self Care | Payer: 59 | Attending: Sports Medicine | Admitting: Sports Medicine

## 2020-07-06 ENCOUNTER — Other Ambulatory Visit: Payer: Self-pay

## 2020-07-06 ENCOUNTER — Encounter: Payer: Self-pay | Admitting: Emergency Medicine

## 2020-07-06 DIAGNOSIS — S0990XA Unspecified injury of head, initial encounter: Secondary | ICD-10-CM

## 2020-07-06 DIAGNOSIS — S0191XA Laceration without foreign body of unspecified part of head, initial encounter: Secondary | ICD-10-CM | POA: Diagnosis not present

## 2020-07-06 DIAGNOSIS — S0003XA Contusion of scalp, initial encounter: Secondary | ICD-10-CM | POA: Diagnosis not present

## 2020-07-06 NOTE — Discharge Instructions (Signed)
You have a small laceration over the left side of your head at the hairline.  Given that it is more than 6 hours there is no indication to suture it at this time.  I have recommended wound care.  Keep it clean and dry and apply the Steri-Strips.  Once it scabs over then you can run discontinue the use of the Steri-Strips.  Your neurological exam is normal. I given you some educational handouts. Over-the-counter meds as needed.  I would stick with Tylenol if you need anything. If you develop any symptoms such as significant headache, vision changes, or changes in behavior then have someone call 911 and go to the ER.
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
## 2020-07-10 ENCOUNTER — Other Ambulatory Visit: Payer: Self-pay

## 2020-07-10 ENCOUNTER — Ambulatory Visit
Admission: EM | Admit: 2020-07-10 | Discharge: 2020-07-10 | Disposition: A | Discharge status: Home / Self Care | Payer: 59 | Attending: Sports Medicine | Admitting: Sports Medicine

## 2020-07-10 DIAGNOSIS — H5789 Other specified disorders of eye and adnexa: Secondary | ICD-10-CM | POA: Diagnosis not present

## 2020-07-10 DIAGNOSIS — H53142 Visual discomfort, left eye: Secondary | ICD-10-CM

## 2020-07-10 DIAGNOSIS — H1031 Unspecified acute conjunctivitis, right eye: Secondary | ICD-10-CM

## 2020-07-10 MED ORDER — ERYTHROMYCIN 5 MG/GM OP OINT: TOPICAL_OINTMENT | OPHTHALMIC | 0 refills | Status: DC

## 2020-07-10 NOTE — ED Triage Notes (Signed)
Patient states that she fell Thursday night and hurt her left eye. Patient seen here on Friday and evaluated. Patient states that eye has continued to worsen and is now draining. States that she is sensitive to light/sun.

## 2020-07-10 NOTE — Discharge Instructions (Addendum)
I prescribed a eye ointment for potential infection secondary to the trauma around your eye. You can use your sunglasses for your light sensitivity. I provided you a number of a eye doctor if things are not improving, please call them and they will see you same day if you let them know that you are being referred from the urgent care. Please see educational handouts. If you have any pain please use Tylenol. Regarding the wound on your head, please check that daily and keep it clean and dry.  If you develop any concerning signs of an infection please come back here.  I hope you get to feeling better, Dr. Drema Dallas
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
## 2020-08-16 ENCOUNTER — Ambulatory Visit (INDEPENDENT_AMBULATORY_CARE_PROVIDER_SITE_OTHER): Payer: 59 | Admitting: Nurse Practitioner

## 2020-08-16 ENCOUNTER — Other Ambulatory Visit: Payer: Self-pay

## 2020-08-16 ENCOUNTER — Encounter: Payer: Self-pay | Admitting: Nurse Practitioner

## 2020-08-16 VITALS — BP 131/80 | HR 84 | Temp 98.4°F | Ht 66.0 in | Wt 203.8 lb

## 2020-08-16 DIAGNOSIS — E559 Vitamin D deficiency, unspecified: Secondary | ICD-10-CM | POA: Diagnosis not present

## 2020-08-16 DIAGNOSIS — I499 Cardiac arrhythmia, unspecified: Secondary | ICD-10-CM | POA: Diagnosis not present

## 2020-08-16 DIAGNOSIS — E669 Obesity, unspecified: Secondary | ICD-10-CM

## 2020-08-16 DIAGNOSIS — Z0000 Encounter for general adult medical examination without abnormal findings: Secondary | ICD-10-CM | POA: Diagnosis not present

## 2020-08-16 DIAGNOSIS — I440 Atrioventricular block, first degree: Secondary | ICD-10-CM | POA: Insufficient documentation

## 2020-08-16 DIAGNOSIS — R7989 Other specified abnormal findings of blood chemistry: Secondary | ICD-10-CM | POA: Diagnosis not present

## 2020-08-16 DIAGNOSIS — I10 Essential (primary) hypertension: Secondary | ICD-10-CM | POA: Diagnosis not present

## 2020-08-16 DIAGNOSIS — Z1159 Encounter for screening for other viral diseases: Secondary | ICD-10-CM

## 2020-08-16 DIAGNOSIS — Z114 Encounter for screening for human immunodeficiency virus [HIV]: Secondary | ICD-10-CM

## 2020-08-16 DIAGNOSIS — D1622 Benign neoplasm of long bones of left lower limb: Secondary | ICD-10-CM

## 2020-08-16 NOTE — Assessment & Plan Note (Signed)
Check TSH today and initiate medication or refer to endocrinology as needed. 

## 2020-08-16 NOTE — Patient Instructions (Addendum)
Healthy Eating Following a healthy eating pattern may help you to achieve and maintain a healthy body weight, reduce the risk of chronic disease, and live a long and productive life. It is important to follow a healthy eating pattern at an appropriate calorie level for your body. Your nutritional needs should be met primarily through food by choosing a variety of nutrient-rich foods. What are tips for following this plan? Reading food labels  Read labels and choose the following: ? Reduced or low sodium. ? Juices with 100% fruit juice. ? Foods with low saturated fats and high polyunsaturated and monounsaturated fats. ? Foods with whole grains, such as whole wheat, cracked wheat, brown rice, and wild rice. ? Whole grains that are fortified with folic acid. This is recommended for women who are pregnant or who want to become pregnant.  Read labels and avoid the following: ? Foods with a lot of added sugars. These include foods that contain brown sugar, corn sweetener, corn syrup, dextrose, fructose, glucose, high-fructose corn syrup, honey, invert sugar, lactose, malt syrup, maltose, molasses, raw sugar, sucrose, trehalose, or turbinado sugar.  Do not eat more than the following amounts of added sugar per day:  6 teaspoons (25 g) for women.  9 teaspoons (38 g) for men. ? Foods that contain processed or refined starches and grains. ? Refined grain products, such as white flour, degermed cornmeal, white bread, and white rice. Shopping  Choose nutrient-rich snacks, such as vegetables, whole fruits, and nuts. Avoid high-calorie and high-sugar snacks, such as potato chips, fruit snacks, and candy.  Use oil-based dressings and spreads on foods instead of solid fats such as butter, stick margarine, or cream cheese.  Limit pre-made sauces, mixes, and "instant" products such as flavored rice, instant noodles, and ready-made pasta.  Try more plant-protein sources, such as tofu, tempeh, black beans,  edamame, lentils, nuts, and seeds.  Explore eating plans such as the Mediterranean diet or vegetarian diet. Cooking  Use oil to saut or stir-fry foods instead of solid fats such as butter, stick margarine, or lard.  Try baking, boiling, grilling, or broiling instead of frying.  Remove the fatty part of meats before cooking.  Steam vegetables in water or broth. Meal planning  At meals, imagine dividing your plate into fourths: ? One-half of your plate is fruits and vegetables. ? One-fourth of your plate is whole grains. ? One-fourth of your plate is protein, especially lean meats, poultry, eggs, tofu, beans, or nuts.  Include low-fat dairy as part of your daily diet.   Lifestyle  Choose healthy options in all settings, including home, work, school, restaurants, or stores.  Prepare your food safely: ? Wash your hands after handling raw meats. ? Keep food preparation surfaces clean by regularly washing with hot, soapy water. ? Keep raw meats separate from ready-to-eat foods, such as fruits and vegetables. ? Cook seafood, meat, poultry, and eggs to the recommended internal temperature. ? Store foods at safe temperatures. In general:  Keep cold foods at 7F (4.4C) or below.  Keep hot foods at 17F (60C) or above.  Keep your freezer at [facility] (-17.8C) or below.  Foods are no longer safe to eat when they have been between the temperatures of 40-17F (4.4-60C) for more than 2 hours. What foods should I eat? Fruits Aim to eat 2 cup-equivalents of fresh, canned (in natural juice), or frozen fruits each day. Examples of 1 cup-equivalent of fruit include 1 small apple, 8 large strawberries, 1 cup canned fruit,  cup dried fruit, or 1 cup 100% juice. Vegetables Aim to eat 2-3 cup-equivalents of fresh and frozen vegetables each day, including different varieties and colors. Examples of 1 cup-equivalent of vegetables include 2 medium carrots, 2 cups raw, leafy greens, 1 cup chopped  vegetable (raw or cooked), or 1 medium baked potato. Grains Aim to eat 6 ounce-equivalents of whole grains each day. Examples of 1 ounce-equivalent of grains include 1 slice of bread, 1 cup ready-to-eat cereal, 3 cups popcorn, or  cup cooked rice, pasta, or cereal. Meats and other proteins Aim to eat 5-6 ounce-equivalents of protein each day. Examples of 1 ounce-equivalent of protein include 1 egg, 1/2 cup nuts or seeds, or 1 tablespoon (16 g) peanut butter. A cut of meat or fish that is the size of a deck of cards is about 3-4 ounce-equivalents.  Of the protein you eat each week, try to have at least 8 ounces come from seafood. This includes salmon, trout, herring, and anchovies. Dairy Aim to eat 3 cup-equivalents of fat-free or low-fat dairy each day. Examples of 1 cup-equivalent of dairy include 1 cup (240 mL) milk, 8 ounces (250 g) yogurt, 1 ounces (44 g) natural cheese, or 1 cup (240 mL) fortified soy milk. Fats and oils  Aim for about 5 teaspoons (21 g) per day. Choose monounsaturated fats, such as canola and olive oils, avocados, peanut butter, and most nuts, or polyunsaturated fats, such as sunflower, corn, and soybean oils, walnuts, pine nuts, sesame seeds, sunflower seeds, and flaxseed. Beverages  Aim for six 8-oz glasses of water per day. Limit coffee to three to five 8-oz cups per day.  Limit caffeinated beverages that have added calories, such as soda and energy drinks.  Limit alcohol intake to no more than 1 drink a day for nonpregnant women and 2 drinks a day for men. One drink equals 12 oz of beer (355 mL), 5 oz of wine (148 mL), or 1 oz of hard liquor (44 mL). Seasoning and other foods  Avoid adding excess amounts of salt to your foods. Try flavoring foods with herbs and spices instead of salt.  Avoid adding sugar to foods.  Try using oil-based dressings, sauces, and spreads instead of solid fats. This information is based on general [place] nutrition guidelines. For more  information, visit [employer]. Exact amounts may vary based on your nutrition needs. Summary  A healthy eating plan may help you to maintain a healthy weight, reduce the risk of chronic diseases, and stay active throughout your life.  Plan your meals. Make sure you eat the right portions of a variety of nutrient-rich foods.  Try baking, boiling, grilling, or broiling instead of frying.  Choose healthy options in all settings, including home, work, school, restaurants, or stores. This information is not intended to replace advice given to you by your health care provider. Make sure you discuss any questions you have with your health care provider. Document Revised: 07/06/2017 Document Reviewed: 07/06/2017 Elsevier Patient Education  2021 [place].  First-Degree Atrioventricular Block  First-degree atrioventricular (AV) block is a condition that causes the electrical signals that travel from the heart's upper chambers (atria) to its lower chambers (ventricles) to move too slowly. As a result, the heart may beat more slowly than normal. First-degree AV block is the least serious type of heart block. Second- and third-degree AV blocks are more serious. First-degree AV block can increase your risk of developing a type of irregular heartbeat called atrial fibrillation. It is also associated  with a higher risk of needing a pacemaker in the future. What are the causes? This condition may be caused by:  Any condition that damages the electrical pathway that controls the heart's rate and rhythm, such as a heart attack.  Overstimulation of the nerve that slows down the heart rate (vagus nerve). This cause is common among well-conditioned athletes.  Some medicines that slow down the heart rate, such as beta blockers or calcium channel blockers.  Surgery that damages the heart. Some people are born with this condition (congenital heart block), but most people develop it over time. What  increases the risk? The risk for this condition increases with age. You are also more likely to develop this condition if you have:  A history of heart attack.  Heart failure.  Coronary heart disease.  Inflammation of heart muscle (myocarditis).  Disease of heart muscle (cardiomyopathy).  Infection of the heart valves (endocarditis).  Infections or diseases that affect the heart. These include: ? Lyme disease. ? Sarcoidosis. ? Hemochromatosis. ? Rheumatic fever. ? Certain muscle disorders. Babies are more likely to be born with heart block if:  The baby's mother has an autoimmune disease, such as lupus.  The baby is born with a heart defect that affects the heart's structure.  A parent was born with a heart defect. What are the signs or symptoms? This condition usually does not cause any symptoms. How is this diagnosed? This condition may be diagnosed based on:  A physical exam.  Your medical history.  A measurement of your pulse or heartbeat.  Tests. These may include: ? An electrocardiogram (ECG). This checks for problems with electrical activity in the heart. ? Ambulatory cardiac monitoring. This is a portable ECG that you wear. It checks your heart's rhythm. ? An electrophysiology (EP) study. Long, thin tubes (catheters) are placed in your heart. The catheters give information about your heart's electrical signals. How is this treated? Usually, treatment is not needed for this condition. In some cases, treatment involves:  Treating an underlying condition, such as heart disease.  Changing or stopping any heart medicines that can cause heart block. Follow these instructions at home: Alcohol use  Do not drink alcohol if: ? Your health care provider tells you not to drink. ? You are pregnant, may be pregnant, or are planning to become pregnant.  If you drink alcohol: ? Limit how much you use to:  0-1 drink a day for women.  0-2 drinks a day for men. ? Be  aware of how much alcohol is in your drink. In the [place] one drink equals one 12 oz bottle of beer (355 mL), one 5 oz glass of wine (148 mL), or one 1 oz glass of hard liquor (44 mL). General instructions  Take over-the-counter and prescription medicines only as told by your health care provider.  Follow your health care provider's recommendations to help reduce your risk of heart disease. These may include: ? Exercising at least 30 minutes on 5 or more days each week (150 minutes). Ask your health care provider what type of exercise is safe for you. ? Eating a heart-healthy diet with fruits and vegetables, whole grains, low-fat dairy products, and lean proteins like poultry and eggs. Your health care provider or dietitian can help you make healthy choices. ? Maintaining a healthy weight.  Do not use any products that contain nicotine or tobacco, such as cigarettes, e-cigarettes, and chewing tobacco. If you need help quitting, ask your health care provider.  Keep all follow-up visits as told by your health care provider. This is important.   Where to find more information  American Heart Association: www.heart.org  National Heart, Lung, and Blood Institute: https://wilson-eaton.com/ Contact a health care provider if you:  Feel like your heart is skipping beats.  Feel more tired than normal.  Have swelling in your hands, feet, or lower legs. Get help right away if you:  Have symptoms that change or get worse.  Develop new symptoms.  Have chest pain, especially if the pain: ? Feels like crushing or pressure. ? Spreads to your arms, back, neck, or jaw.  Feel short of breath.  Feel light-headed or weak.  Faint. These symptoms may represent a serious problem that is an emergency. Do not wait to see if the symptoms will go away. Get medical help right away. Call your local emergency services (911 in the [place] Do not drive yourself to the hospital. Summary  First-degree atrioventricular  (AV) block is the least serious type of heart block. In this condition, the signals that control heart rate move too slowly. As a result, the heart may beat more slowly than normal.  Usually, treatment is not needed for this condition. In some cases, you may need to change or stop medicines that may be making the condition worse.  Healthy lifestyle choices such as exercising regularly, eating a healthy diet, and limiting alcohol are good for your heart. This information is not intended to replace advice given to you by your health care provider. Make sure you discuss any questions you have with your health care provider. Document Revised: 01/31/2019 Document Reviewed: 01/31/2019 Elsevier Patient Education  [place].

## 2020-08-16 NOTE — Assessment & Plan Note (Signed)
Chronic, ongoing with BP stable today without medication. Recommend she monitor BP at least a few mornings a week at home and document.  DASH diet at home.  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  Labs today: CMP, CBC, TSH, Vit D, Lipid.  Return in 8 weeks for follow-up.

## 2020-08-16 NOTE — Progress Notes (Signed)
BP 131/80   Pulse 84   Temp 98.4 F (36.9 C) (Oral)   Ht 5\' 6"  (1.676 m)   Wt 203 lb 12.8 oz (92.4 kg)   LMP 07/23/2020 (Exact Date)   SpO2 99%   BMI 32.89 kg/m    Subjective:    Patient ID: Kathleen [name], female    DOB: [birth year], 40 y.o.   MRN: [id number]  HPI: Kathleen [name] is a 40 y.o. female presenting on 08/16/2020 for comprehensive medical examination. Current medical complaints include:none  She currently lives with: kids Menopausal Symptoms: no   Has diagnosis of benign osteochondroma of left femur, last seen at [facility] 04/20/2018 with no intervention at this time.  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  Duplex was performed by vascular and on intervention recommended at this time, to wear compression hose.  HYPERTENSION At this time point is focused on diet and exercise.  Has been exercising frequently during week.  No medications -- in past took Lisinopril. Hypertension status: stable  Satisfied with current treatment? yes Duration of hypertension: chronic BP monitoring frequency: once a week BP range: varies, sometimes a little elevated at work -- often <130/80 BP medication side effects:  no Medication compliance: good compliance Aspirin: no Recurrent headaches: no Visual changes: no Palpitations: no Dyspnea: no Chest pain: no Lower extremity edema: yes Dizzy/lightheaded: no  Depression Screen done today and results listed below:  Depression screen [facility] 2/9 08/16/2020 08/15/2019 05/25/2018 02/22/2018  Decreased Interest 0 0 0 1  Down, Depressed, Hopeless 0 0 0 0  PHQ - 2 Score 0 0 0 1  Altered sleeping - 0 - 1  Tired, decreased energy - 0 - 1  Change in appetite - 0 - 0  Feeling bad or failure about yourself  - 0 - 1  Trouble concentrating - 0 - 0  Moving slowly or fidgety/restless - 0 - 0  Suicidal thoughts - 0 - 0  PHQ-9 Score - 0 - 4  Difficult  doing work/chores - Not difficult at all - Not difficult at all    The patient does not have a history of falls. I did not complete a risk assessment for falls. A plan of care for falls was not documented.   Past Medical History:  Past Medical History:  Diagnosis Date  . Allergy     Surgical History:  Past Surgical History:  Procedure Laterality Date  . STENT PLACEMENT VASCULAR ([place] HX)    . TUBAL LIGATION      Medications:  Current Outpatient Medications on File Prior to Visit  Medication Sig  . EPINEPHrine 0.3 mg/0.3 mL IJ SOAJ injection Inject 0.3 mg into the muscle as needed.    No current facility-administered medications on file prior to visit.    Allergies:  No Known Allergies  Social History:  Social History   Socioeconomic History  . Marital status: Single    Spouse name: Not on file  . Number of children: Not on file  . Years of education: Not on file  . Highest education level: Not on file  Occupational History  . Not on file  Tobacco Use  . Smoking status: Never Smoker  . Smokeless tobacco: Never Used  Vaping Use  . Vaping Use: Never used  Substance and Sexual Activity  . Alcohol use: Yes    Alcohol/week: 1.0 standard drink    Types: 1 Glasses  of wine per week  . Drug use: Not Currently  . Sexual activity: Yes  Other Topics Concern  . Not on file  Social History Narrative  . Not on file   Social Determinants of Health   Financial Resource Strain: Low Risk   . Difficulty of Paying Living Expenses: Not hard at all  Food Insecurity: No Food Insecurity  . Worried About Programme researcher, broadcasting/film/videounning Out of Food in the Last Year: Never true  . Ran Out of Food in the Last Year: Never true  Transportation Needs: No Transportation Needs  . Lack of Transportation (Medical): No  . Lack of Transportation (Non-Medical): No  Physical Activity: Sufficiently Active  . Days of Exercise per Week: 3 days  . Minutes of Exercise per Session: 50 min  Stress: No Stress Concern  Present  . Feeling of Stress : Not at all  Social Connections: Socially Isolated  . Frequency of Communication with Friends and Family: More than three times a week  . Frequency of Social Gatherings with Friends and Family: More than three times a week  . Attends Religious Services: Never  . Active Member of Clubs or Organizations: No  . Attends BankerClub or Organization Meetings: Never  . Marital Status: Never married  Catering managerntimate Partner Violence: Not on file   Social History   Tobacco Use  Smoking Status Never Smoker  Smokeless Tobacco Never Used   Social History   Substance and Sexual Activity  Alcohol Use Yes  . Alcohol/week: 1.0 standard drink  . Types: 1 Glasses of wine per week    Family History:  Family History  Problem Relation Age of Onset  . Congestive Heart Failure Mother   . Pulmonary Hypertension Mother   . COPD Mother   . Irritable bowel syndrome Sister     Past medical history, surgical history, medications, allergies, family history and social history reviewed with patient today and changes made to appropriate areas of the chart.   Review of Systems - negative All other ROS negative except what is listed above and in the HPI.      Objective:    BP 131/80   Pulse 84   Temp 98.4 F (36.9 C) (Oral)   Ht 5\' 6"  (1.676 m)   Wt 203 lb 12.8 oz (92.4 kg)   LMP 07/23/2020 (Exact Date)   SpO2 99%   BMI 32.89 kg/m   Wt Readings from Last 3 Encounters:  08/16/20 203 lb 12.8 oz (92.4 kg)  07/10/20 200 lb (90.7 kg)  07/06/20 200 lb (90.7 kg)    Physical Exam Vitals and nursing note reviewed.  Constitutional:      General: She is awake. She is not in acute distress.    Appearance: She is well-developed. She is not ill-appearing.  HENT:     Head: Normocephalic and atraumatic.     Right Ear: Hearing, tympanic membrane, ear canal and external ear normal. No drainage.     Left Ear: Hearing, tympanic membrane, ear canal and external ear normal. No drainage.      Nose: Nose normal.     Right Sinus: No maxillary sinus tenderness or frontal sinus tenderness.     Left Sinus: No maxillary sinus tenderness or frontal sinus tenderness.     Mouth/Throat:     Mouth: Mucous membranes are moist.     Pharynx: Oropharynx is clear. Uvula midline. No pharyngeal swelling, oropharyngeal exudate or posterior oropharyngeal erythema.  Eyes:     General: Lids are normal.  Right eye: No discharge.        Left eye: No discharge.     Extraocular Movements: Extraocular movements intact.     Conjunctiva/sclera: Conjunctivae normal.     Pupils: Pupils are equal, round, and reactive to light.     Visual Fields: Right eye visual fields normal and left eye visual fields normal.  Neck:     Thyroid: No thyromegaly.     Vascular: No carotid bruit.     Trachea: Trachea normal.  Cardiovascular:     Rate and Rhythm: Normal rate. Rhythm irregular.     Heart sounds: Normal heart sounds. No murmur heard. No gallop.   Pulmonary:     Effort: Pulmonary effort is normal. No accessory muscle usage or respiratory distress.     Breath sounds: Normal breath sounds.  Chest:  Breasts:     Right: Normal. No axillary adenopathy or supraclavicular adenopathy.     Left: Normal. No axillary adenopathy or supraclavicular adenopathy.    Abdominal:     General: Bowel sounds are normal.     Palpations: Abdomen is soft. There is no hepatomegaly or splenomegaly.     Tenderness: There is no abdominal tenderness.  Musculoskeletal:        General: Normal range of motion.     Cervical back: Normal range of motion and neck supple.     Right lower leg: No edema.     Left lower leg: No edema.  Lymphadenopathy:     Head:     Right side of head: No submental, submandibular, tonsillar, preauricular or posterior auricular adenopathy.     Left side of head: No submental, submandibular, tonsillar, preauricular or posterior auricular adenopathy.     Cervical: No cervical adenopathy.     Upper  Body:     Right upper body: No supraclavicular, axillary or pectoral adenopathy.     Left upper body: No supraclavicular, axillary or pectoral adenopathy.  Skin:    General: Skin is warm and dry.     Capillary Refill: Capillary refill takes less than 2 seconds.     Findings: No rash.  Neurological:     Mental Status: She is alert and oriented to person, place, and time.     Cranial Nerves: Cranial nerves are intact.     Gait: Gait is intact.     Deep Tendon Reflexes: Reflexes are normal and symmetric.     Reflex Scores:      Brachioradialis reflexes are 2+ on the right side and 2+ on the left side.      Patellar reflexes are 2+ on the right side and 2+ on the left side. Psychiatric:        Attention and Perception: Attention normal.        Mood and Affect: Mood normal.        Speech: Speech normal.        Behavior: Behavior normal. Behavior is cooperative.        Thought Content: Thought content normal.        Judgment: Judgment normal.   EKG My review and personal interpretation at Time: 1015  Indication: Irregular HR  Rate: 68  Rhythm: sinus Axis: normal Other: 1st degree AV block noted -- noted on past EKG on review in 2020. Results for orders placed or performed in visit on 02/02/20  WET PREP FOR [place], YEAST, CLUE   Specimen: Vaginal; Sterile Swab   Sterile Swab  Result Value Ref Range   Trichomonas Exam Negative  Negative   Yeast Exam Negative Negative   Clue Cell Exam Positive (A) Negative  UA/M w/rflx Culture, Routine   Specimen: Urine   Urine  Result Value Ref Range   Specific Gravity, UA 1.010 1.005 - 1.030   pH, UA 6.0 5.0 - 7.5   Color, UA Yellow Yellow   Appearance Ur Clear Clear   Leukocytes,UA Negative Negative   Protein,UA Negative Negative/Trace   Glucose, UA Negative Negative   Ketones, UA Negative Negative   RBC, UA Negative Negative   Bilirubin, UA Negative Negative   Urobilinogen, Ur 0.2 0.2 - 1.0 mg/dL   Nitrite, UA Negative Negative       Assessment & Plan:   Problem List Items Addressed This Visit      Cardiovascular and Mediastinum   Essential hypertension    Chronic, ongoing with BP stable today without medication. Recommend she monitor BP at least a few mornings a week at home and document.  DASH diet at home.  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  Labs today: CMP, CBC, TSH, Vit D, Lipid.  Return in 8 weeks for follow-up.       Relevant Orders   CBC with Differential/Platelet   Comprehensive metabolic panel   Lipid Panel w/o Chol/HDL Ratio   TSH     Musculoskeletal and Integument   Osteochondroma of femur, left    Continue to collaborate with ortho at [facility] as needed.        Other   Obesity (BMI 30.0-34.9)    BMI 32.89. Recommended eating smaller high protein, low fat meals more frequently and exercising 30 mins a day 5 times a week with a goal of 10-15lb weight loss in the next 3 months. Patient voiced their understanding and motivation to adhere to these recommendations.       Vitamin D deficiency    History of low levels, recheck today and start supplement as needed.      Relevant Orders   VITAMIN D 25 Hydroxy (Vit-D Deficiency, Fractures)   Low TSH level    Check TSH today and initiate medication or refer to endocrinology as needed.      Relevant Orders   TSH   Irregular heart beat - Primary   Relevant Orders   EKG 12-Lead (Completed)   Ambulatory referral to Cardiology    Other Visit Diagnoses    Need for hepatitis C screening test       One time Hep C screening today per guideline recommendations, discussed with patient.   Relevant Orders   Hepatitis C antibody   Encounter for screening for HIV       HIV screening today per patient request.   Relevant Orders   HIV Antibody (routine testing w rflx)       Follow up plan: Return in about 8 weeks (around 10/11/2020) for First Degree Block.   LABORATORY TESTING:  - Pap  smear: Up To Date  IMMUNIZATIONS:   - Tdap: Tetanus vaccination status reviewed: last tetanus booster within 10 years. - Influenza: Up to date - Pneumovax: Not applicable - Prevnar: Not applicable - HPV: Not applicable - Zostavax vaccine: Not applicable  - Covid == has had x 2  SCREENING: -Mammogram: Not applicable  - Colonoscopy: Not applicable  - Bone Density: Not applicable  -Hearing Test: Not applicable  -Spirometry: Not applicable   PATIENT COUNSELING:   Advised to take 1 mg of  folate supplement per day if capable of pregnancy.   Sexuality: Discussed sexually transmitted diseases, partner selection, use of condoms, avoidance of unintended pregnancy  and contraceptive alternatives.   Advised to avoid cigarette smoking.  I discussed with the patient that most people either abstain from alcohol or drink within safe limits (<=14/week and <=4 drinks/occasion for males, <=7/weeks and <= 3 drinks/occasion for females) and that the risk for alcohol disorders and other health effects rises proportionally with the number of drinks per week and how often a drinker exceeds daily limits.  Discussed cessation/primary prevention of drug use and availability of treatment for abuse.   Diet: Encouraged to adjust caloric intake to maintain  or achieve ideal body weight, to reduce intake of dietary saturated fat and total fat, to limit sodium intake by avoiding high sodium foods and not adding table salt, and to maintain adequate dietary potassium and calcium preferably from fresh fruits, vegetables, and low-fat dairy products.    Stressed the importance of regular exercise  Injury prevention: Discussed safety belts, safety helmets, smoke detector, smoking near bedding or upholstery.   Dental health: Discussed importance of regular tooth brushing, flossing, and dental visits.    NEXT PREVENTATIVE PHYSICAL DUE IN 1 YEAR. Return in about 8 weeks (around 10/11/2020) for First Degree  Block.

## 2020-08-16 NOTE — Assessment & Plan Note (Signed)
Continue to collaborate with ortho at [facility] as needed. 

## 2020-08-16 NOTE — Assessment & Plan Note (Signed)
History of low levels, recheck today and start supplement as needed.

## 2020-08-16 NOTE — Assessment & Plan Note (Signed)
BMI 32.89.  Recommended eating smaller high protein, low fat meals more frequently and exercising 30 mins a day 5 times a week with a goal of 10-15lb weight loss in the next 3 months. Patient voiced their understanding and motivation to adhere to these recommendations. ? ?

## 2020-08-17 LAB — COMPREHENSIVE METABOLIC PANEL
ALT: 10 IU/L (ref 0–32)
AST: 11 IU/L (ref 0–40)
Albumin/Globulin Ratio: 1.7 (ref 1.2–2.2)
Albumin: 4.7 g/dL (ref 3.8–4.8)
Alkaline Phosphatase: 59 IU/L (ref 44–121)
BUN/Creatinine Ratio: 15 (ref 9–23)
BUN: 12 mg/dL (ref 6–20)
Bilirubin Total: 0.6 mg/dL (ref 0.0–1.2)
CO2: 23 mmol/L (ref 20–29)
Calcium: 9.2 mg/dL (ref 8.7–10.2)
Chloride: 100 mmol/L (ref 96–106)
Creatinine, Ser: 0.81 mg/dL (ref 0.57–1.00)
Globulin, Total: 2.7 g/dL (ref 1.5–4.5)
Glucose: 91 mg/dL (ref 65–99)
Potassium: 4.3 mmol/L (ref 3.5–5.2)
Sodium: 141 mmol/L (ref 134–144)
Total Protein: 7.4 g/dL (ref 6.0–8.5)
eGFR: 95 mL/min/{1.73_m2} (ref >59)

## 2020-08-17 LAB — CBC WITH DIFFERENTIAL/PLATELET
Basophils Absolute: 0.1 10*3/uL (ref 0.0–0.2)
Basos: 1 %
EOS (ABSOLUTE): 0.2 10*3/uL (ref 0.0–0.4)
Eos: 3 %
Hematocrit: 37.7 % (ref 34.0–46.6)
Hemoglobin: 12 g/dL (ref 11.1–15.9)
Immature Grans (Abs): 0 10*3/uL (ref 0.0–0.1)
Immature Granulocytes: 0 %
Lymphocytes Absolute: 2.3 10*3/uL (ref 0.7–3.1)
Lymphs: 41 %
MCH: 26.6 pg (ref 26.6–33.0)
MCHC: 31.8 g/dL (ref 31.5–35.7)
MCV: 84 fL (ref 79–97)
Monocytes Absolute: 0.4 10*3/uL (ref 0.1–0.9)
Monocytes: 7 %
Neutrophils Absolute: 2.7 10*3/uL (ref 1.4–7.0)
Neutrophils: 48 %
Platelets: 277 10*3/uL (ref 150–450)
RBC: 4.51 x10E6/uL (ref 3.77–5.28)
RDW: 14.6 % (ref 11.7–15.4)
WBC: 5.6 10*3/uL (ref 3.4–10.8)

## 2020-08-17 LAB — TSH: TSH: 0.54 u[IU]/mL (ref 0.450–4.500)

## 2020-08-17 LAB — LIPID PANEL W/O CHOL/HDL RATIO
Cholesterol, Total: 145 mg/dL (ref 100–199)
HDL: 72 mg/dL (ref >39)
LDL Chol Calc (NIH): 64 mg/dL (ref 0–99)
Triglycerides: 38 mg/dL (ref 0–149)
VLDL Cholesterol Cal: 9 mg/dL (ref 5–40)

## 2020-08-17 LAB — VITAMIN D 25 HYDROXY (VIT D DEFICIENCY, FRACTURES): Vit D, 25-Hydroxy: 19.9 ng/mL — ABNORMAL LOW (ref 30.0–100.0)

## 2020-08-17 LAB — HEPATITIS C ANTIBODY: Hep C Virus Ab: <0.1 s/co ratio (ref 0.0–0.9)

## 2020-08-17 LAB — HIV ANTIBODY (ROUTINE TESTING W REFLEX): HIV Screen 4th Generation wRfx: NONREACTIVE

## 2020-08-17 NOTE — Progress Notes (Signed)
Contacted via MyChart   Good evening Doni, your labs have returned and overall look fantastic.  Your Vitamin D level remains a little low.  I do recommend taking 2000 units Vitamin D3 daily for overall bone and muscle health.  Any questions on these? Keep being awesome!!  Thank you for allowing me to participate in your care.  I appreciate you. Kindest regards, Demonte Dobratz

## 2020-08-29 ENCOUNTER — Ambulatory Visit: Payer: 59 | Admitting: Internal Medicine

## 2020-08-29 NOTE — Progress Notes (Deleted)
New Outpatient Visit Date: 08/29/2020  Referring Provider: Venita Lick, NP [address] [place],  [place] [address]  Chief Complaint: ***  HPI:  Ms. Ferrer is a 40 y.o. female who is being seen today for the evaluation of irregular heartbeat at the request of Ms. Cannady. She has a history of pretension, May-Thurner syndrome, and benign osteochondroma of the left femur.  She was recently seen for routine physical by Ms. Cannady and mention an irregular heartbeat.  EKG showed sinus rhythm with first-degree AV block and no other significant abnormality.  Labs, including serum chemistries and TSH, were unremarkable.  --------------------------------------------------------------------------------------------------  Cardiovascular History & Procedures: Cardiovascular Problems:  Irregular heartbeat  Risk Factors:  Hypertension  Cath/PCI:  None  CV Surgery:  None  EP Procedures and Devices:  None  Non-Invasive Evaluation(s):  None  Recent CV Pertinent Labs: Lab Results  Component Value Date   CHOL 145 08/16/2020   HDL 72 08/16/2020   LDLCALC 64 08/16/2020   TRIG 38 08/16/2020   K 4.3 08/16/2020   BUN 12 08/16/2020   CREATININE 0.81 08/16/2020    --------------------------------------------------------------------------------------------------  Past Medical History:  Diagnosis Date  . Allergy     Past Surgical History:  Procedure Laterality Date  . STENT PLACEMENT VASCULAR ([place] HX)    . TUBAL LIGATION      No outpatient medications have been marked as taking for the 08/29/20 encounter (Appointment) with Rhen Dossantos, Harrell Gave, MD.    Allergies: Patient has no known allergies.  Social History   Tobacco Use  . Smoking status: Never Smoker  . Smokeless tobacco: Never Used  Vaping Use  . Vaping Use: Never used  Substance Use Topics  . Alcohol use: Yes    Alcohol/week: 1.0 standard drink    Types: 1 Glasses of wine per week  . Drug use: Not Currently     Family History  Problem Relation Age of Onset  . Congestive Heart Failure Mother   . Pulmonary Hypertension Mother   . COPD Mother   . Irritable bowel syndrome Sister     Review of Systems: A 12-system review of systems was performed and was negative except as noted in the HPI.  --------------------------------------------------------------------------------------------------  Physical Exam: There were no vitals taken for this visit.  General:  *** HEENT: No conjunctival pallor or scleral icterus. Facemask in place. Neck: Supple without lymphadenopathy, thyromegaly, JVD, or HJR. No carotid bruit. Lungs: Normal work of breathing. Clear to auscultation bilaterally without wheezes or crackles. Heart: Regular rate and rhythm without murmurs, rubs, or gallops. Non-displaced PMI. Abd: Bowel sounds present. Soft, NT/ND without hepatosplenomegaly Ext: No lower extremity edema. Radial, PT, and DP pulses are 2+ bilaterally Skin: Warm and dry without rash. Neuro: CNIII-XII intact. Strength and fine-touch sensation intact in upper and lower extremities bilaterally. Psych: Normal mood and affect.  EKG:  ***  Lab Results  Component Value Date   WBC 5.6 08/16/2020   HGB 12.0 08/16/2020   HCT 37.7 08/16/2020   MCV 84 08/16/2020   PLT 277 08/16/2020    Lab Results  Component Value Date   NA 141 08/16/2020   K 4.3 08/16/2020   CL 100 08/16/2020   CO2 23 08/16/2020   BUN 12 08/16/2020   CREATININE 0.81 08/16/2020   GLUCOSE 91 08/16/2020   ALT 10 08/16/2020    Lab Results  Component Value Date   CHOL 145 08/16/2020   HDL 72 08/16/2020   LDLCALC 64 08/16/2020   TRIG 38  08/16/2020     --------------------------------------------------------------------------------------------------  ASSESSMENT AND PLAN: Nelva Bush, MD 08/29/2020 6:14 AM

## 2020-10-04 ENCOUNTER — Ambulatory Visit: Payer: 59 | Admitting: Cardiology

## 2020-10-05 ENCOUNTER — Ambulatory Visit: Payer: 59 | Admitting: Cardiology

## 2020-10-09 ENCOUNTER — Encounter: Payer: Self-pay | Admitting: Nurse Practitioner

## 2020-10-11 ENCOUNTER — Ambulatory Visit: Payer: 59 | Admitting: Nurse Practitioner

## 2020-11-13 ENCOUNTER — Encounter: Payer: Self-pay | Admitting: Cardiology

## 2020-11-13 ENCOUNTER — Other Ambulatory Visit: Payer: Self-pay

## 2020-11-13 ENCOUNTER — Ambulatory Visit: Payer: 59 | Admitting: Cardiology

## 2020-11-13 VITALS — BP 130/90 | HR 74 | Ht 66.0 in | Wt 205.0 lb

## 2020-11-13 DIAGNOSIS — R600 Localized edema: Secondary | ICD-10-CM | POA: Diagnosis not present

## 2020-11-13 DIAGNOSIS — I440 Atrioventricular block, first degree: Secondary | ICD-10-CM

## 2020-11-13 NOTE — Patient Instructions (Signed)
Medication Instructions:   Your physician recommends that you continue on your current medications as directed. Please refer to the Current Medication list given to you today.  *If you need a refill on your cardiac medications before your next appointment, please call your pharmacy*   Lab Work:  None ordered  Testing/Procedures:  None ordered   Follow-Up: At [facility], you and your health needs are our priority.  As part of our continuing mission to provide you with exceptional heart care, we have created designated Provider Care Teams.  These Care Teams include your primary Cardiologist (physician) and Advanced Practice Providers (APPs -  Physician Assistants and Nurse Practitioners) who all work together to provide you with the care you need, when you need it.  We recommend signing up for the patient portal called "MyChart".  Sign up information is provided on this After Visit Summary.  MyChart is used to connect with patients for Virtual Visits (Telemedicine).  Patients are able to view lab/test results, encounter notes, upcoming appointments, etc.  Non-urgent messages can be sent to your provider as well.   To learn more about what you can do with MyChart, go to [email or web address].    Your next appointment:    Follow up as needed   Provider:   You may see Kate Sable, MD or one of the following Advanced Practice Providers on your designated Care Team:   Murray Hodgkins, NP Christell Faith, PA-C Marrianne Mood, PA-C Cadence [place], [place]

## 2020-11-13 NOTE — Progress Notes (Signed)
Cardiology Office Note:    Date:  11/13/2020   ID:  Kathleen [name], DOB [birth year], MRN [id number]  PCP:  Venita Lick, NP   [facility] Providers Cardiologist:  Kate Sable, MD     Referring MD: Venita Lick, NP   Chief Complaint  Patient presents with   New Patient (Initial Visit)    Referred by PCP for irregular heart beat. Meds reviewed verbally with patient.    Kathleen [name] is a 40 y.o. female who is being seen today for the evaluation of abnormal EKG at the request of Venita Lick, NP.   History of Present Illness:    Kathleen [name] is a 40 y.o. 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  Patient saw her primary care provider for follow-up visit, EKG obtained 08/16/2020 revealed first-degree AV block.  No artists abnormalities noted.  She denies palpitations, dizziness, chest pain, shortness of breath.  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  Had an iliac vein stent placed then.  Swelling has overall improved, although it occasionally still occurs.  She feels well, has no other concerns at this time.  Past Medical History:  Diagnosis Date   Allergy     Past Surgical History:  Procedure Laterality Date   STENT PLACEMENT VASCULAR ([facility] HX)     TUBAL LIGATION      Current Medications: Current Meds  Medication Sig   EPINEPHrine 0.3 mg/0.3 mL IJ SOAJ injection Inject 0.3 mg into the muscle as needed.      Allergies:   Patient has no known allergies.   Social History   Socioeconomic History   Marital status: Single    Spouse name: Not on file   Number of children: Not on file   Years of education: Not on file   Highest education level: Not on file  Occupational History   Not on file  Tobacco Use   Smoking status: Never   Smokeless tobacco: Never   Vaping Use   Vaping Use: Never used  Substance and Sexual Activity   Alcohol use: Yes    Alcohol/week: 1.0 standard drink    Types: 1 Glasses of wine per week   Drug use: Not Currently   Sexual activity: Yes  Other Topics Concern   Not on file  Social History Narrative   Not on file   Social Determinants of Health   Financial Resource Strain: Low Risk    Difficulty of Paying Living Expenses: Not hard at all  Food Insecurity: No Food Insecurity   Worried About Charity fundraiser in the Last Year: Never true   [place] in the Last Year: Never true  Transportation Needs: No Transportation Needs   Lack of Transportation (Medical): No   Lack of Transportation (Non-Medical): No  Physical Activity: Sufficiently Active   Days of Exercise per Week: 3 days   Minutes of Exercise per Session: 50 min  Stress: No Stress Concern Present   Feeling of Stress : Not at all  Social Connections: Socially Isolated   Frequency of Communication with Friends and Family: More than three times a week   Frequency of Social Gatherings with Friends and Family: More than three times a week   Attends Religious Services: Never   Marine scientist or Organizations:  No   Attends Archivist Meetings: Never   Marital Status: Never married     Family History: The patient's family history includes COPD in her mother; Congestive Heart Failure in her mother; Irritable bowel syndrome in her sister; Pulmonary Hypertension in her mother.  ROS:   Please see the history of present illness.     All other systems reviewed and are negative.  EKGs/Labs/Other Studies Reviewed:    The following studies were reviewed today:   EKG:  EKG is  ordered today.  The ekg ordered today demonstrates sinus rhythm with first-degree AV block, otherwise normal ECG  Recent Labs: 08/16/2020: ALT 10; BUN 12; Creatinine, Ser 0.81; Hemoglobin 12.0; Platelets 277; Potassium 4.3; Sodium 141; TSH 0.540  Recent  Lipid Panel    Component Value Date/Time   CHOL 145 08/16/2020 1015   TRIG 38 08/16/2020 1015   HDL 72 08/16/2020 1015   LDLCALC 64 08/16/2020 1015     Risk Assessment/Calculations:          Physical Exam:    VS:  BP 130/90 (BP Location: Left Arm, Patient Position: Sitting, Cuff Size: Normal)   Pulse 74   Ht '5\' 6"'$  (1.676 m)   Wt 205 lb (93 kg)   BMI 33.09 kg/m     Wt Readings from Last 3 Encounters:  11/13/20 205 lb (93 kg)  08/16/20 203 lb 12.8 oz (92.4 kg)  07/10/20 200 lb (90.7 kg)     GEN:  Well nourished, well developed in no acute distress HEENT: Normal NECK: No JVD; No carotid bruits LYMPHATICS: No lymphadenopathy CARDIAC: RRR, no murmurs, rubs, gallops RESPIRATORY:  Clear to auscultation without rales, wheezing or rhonchi  ABDOMEN: Soft, non-tender, non-distended MUSCULOSKELETAL:  No edema; No deformity  SKIN: Warm and dry NEUROLOGIC:  Alert and oriented x 3 PSYCHIATRIC:  Normal affect   ASSESSMENT:    1. First degree AV block   2. Leg edema, left    PLAN:    In order of problems listed above:  First-degree AV block noted on EKG 08/16/2020, no other abnormalities noted.  No indication for pacemaker, patient is asymptomatic.  No indication for additional testing at this time.  Patient reassured as to the benign nature of first-degree AV block. History of likely May-Thurner syndrome, s/p venous stent 2013.  Left lower extremity edema symptoms improved.  Continue to monitor.  If issues arise, recommend vascular surgery input.  Follow-up as needed    Medication Adjustments/Labs and Tests Ordered: Current medicines are reviewed at length with the patient today.  Concerns regarding medicines are outlined above.  Orders Placed This Encounter  Procedures   EKG 12-Lead    No orders of the defined types were placed in this encounter.   Patient Instructions  Medication Instructions:   Your physician recommends that you continue on your current  medications as directed. Please refer to the Current Medication list given to you today.  *If you need a refill on your cardiac medications before your next appointment, please call your pharmacy*   Lab Work:  None ordered  Testing/Procedures:  None ordered   Follow-Up: At [facility], you and your health needs are our priority.  As part of our continuing mission to provide you with exceptional heart care, we have created designated Provider Care Teams.  These Care Teams include your primary Cardiologist (physician) and Advanced Practice Providers (APPs -  Physician Assistants and Nurse Practitioners) who all work together to provide you with the care you  need, when you need it.  We recommend signing up for the patient portal called "MyChart".  Sign up information is provided on this After Visit Summary.  MyChart is used to connect with patients for Virtual Visits (Telemedicine).  Patients are able to view lab/test results, encounter notes, upcoming appointments, etc.  Non-urgent messages can be sent to your provider as well.   To learn more about what you can do with MyChart, go to [email or web address].    Your next appointment:    Follow up as needed   Provider:   You may see Kate Sable, MD or one of the following Advanced Practice Providers on your designated Care Team:   Murray Hodgkins, NP Christell Faith, PA-C Marrianne Mood, PA-C Cadence Cook, [place]    Signed, Kate Sable, MD  11/13/2020 10:16 AM    [place]

## 2020-11-17 ENCOUNTER — Encounter: Payer: Self-pay | Admitting: Nurse Practitioner

## 2020-11-21 ENCOUNTER — Ambulatory Visit: Payer: 59 | Admitting: Nurse Practitioner

## 2020-11-28 ENCOUNTER — Ambulatory Visit: Payer: 59

## 2020-12-19 ENCOUNTER — Ambulatory Visit: Payer: 59 | Admitting: Nurse Practitioner

## 2021-01-04 ENCOUNTER — Ambulatory Visit: Payer: 59 | Admitting: Nurse Practitioner

## 2021-01-11 IMAGING — US US ABDOMEN LIMITED
1 series · 12 of 12 positions shown · non-contrast
Comparison: None.

CLINICAL DATA: Palpable mass right upper quadrant region for
approximately 5 months

EXAM:
ULTRASOUND RIGHT UPPER QUADRANT ABDOMINAL WALL

[Series 1: us abdomen limited · 0.22mm/px · 12 of 12 slices shown]
[im 1/12]
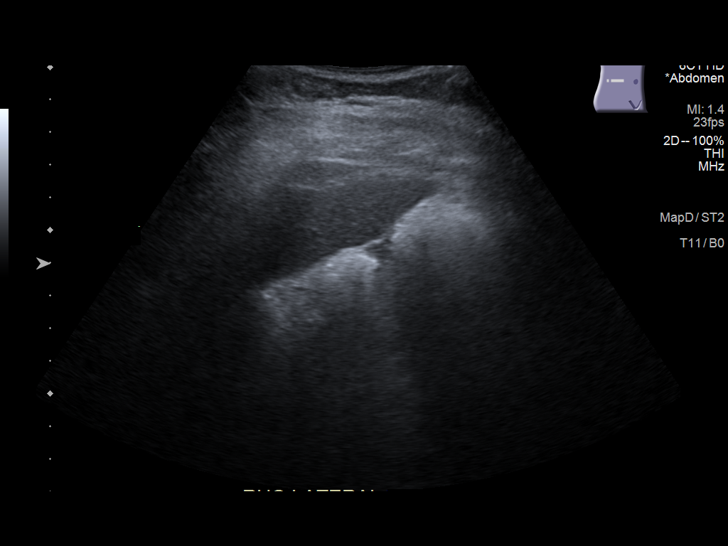
[im 2/12]
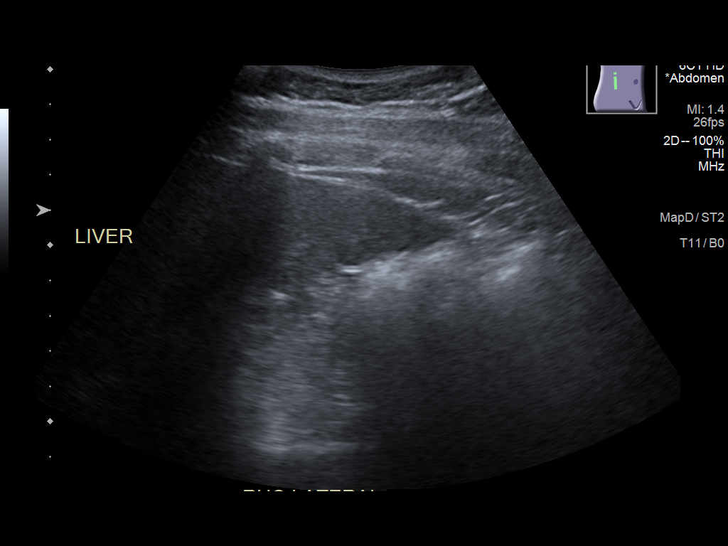
[im 3/12]
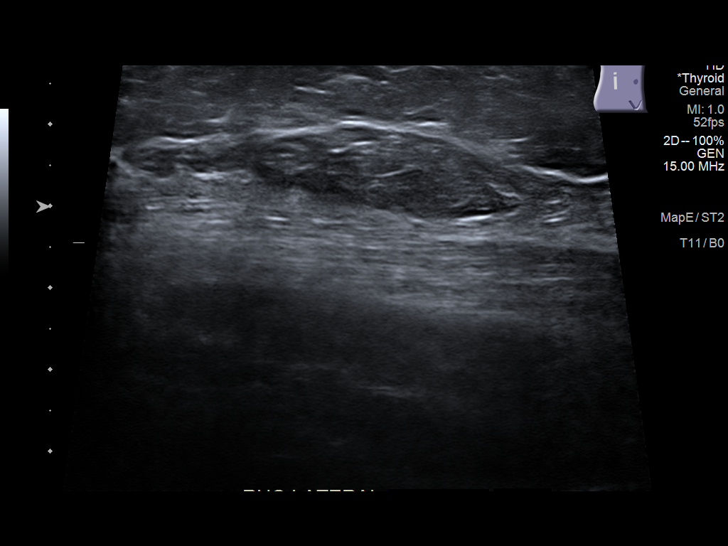
[im 4/12]
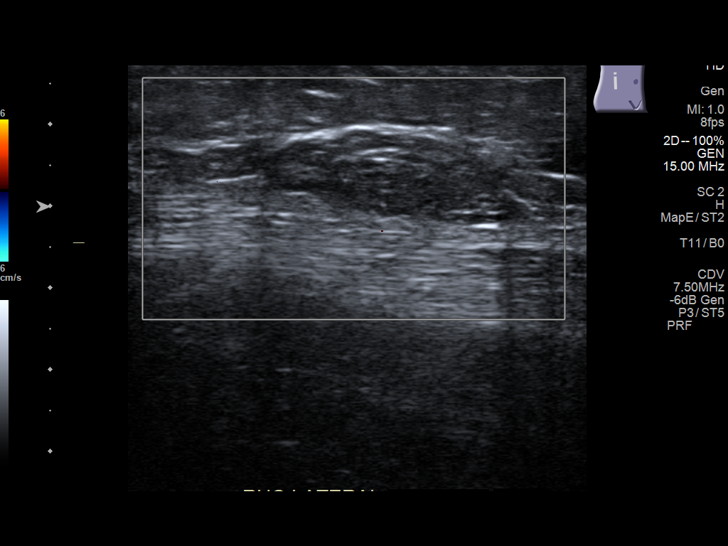
[im 5/12]
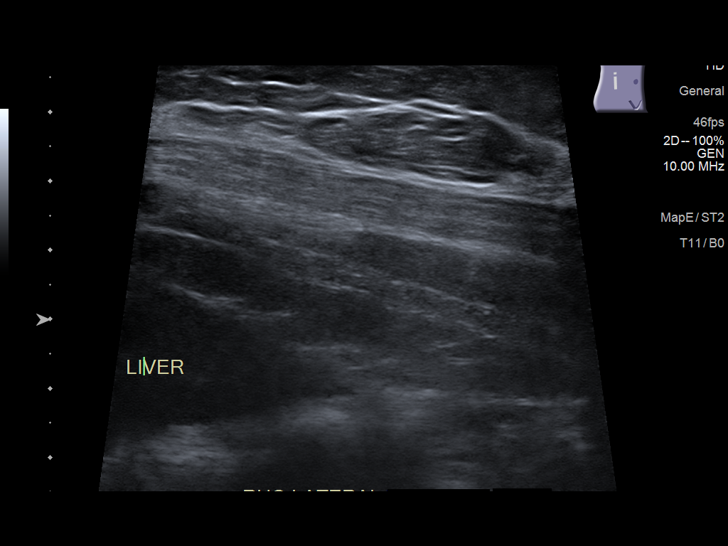
[im 6/12]
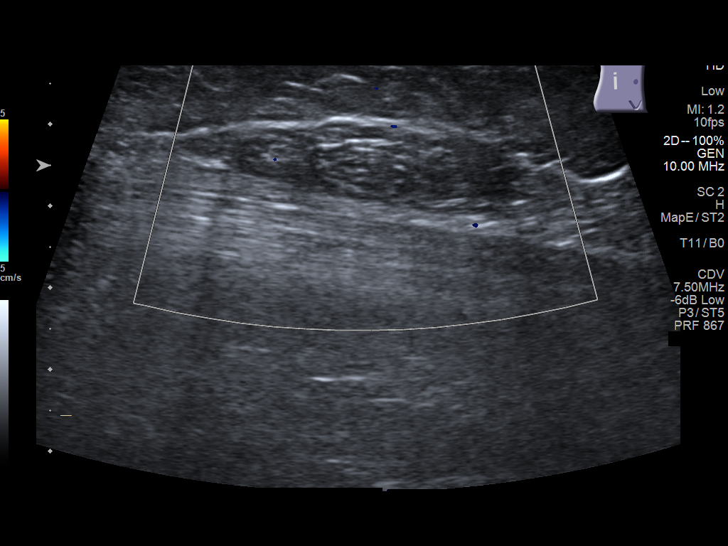
[im 7/12]
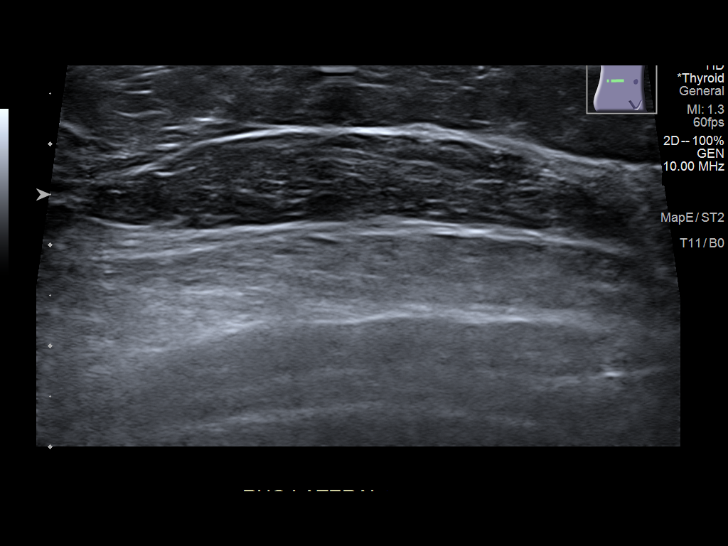
[im 8/12]
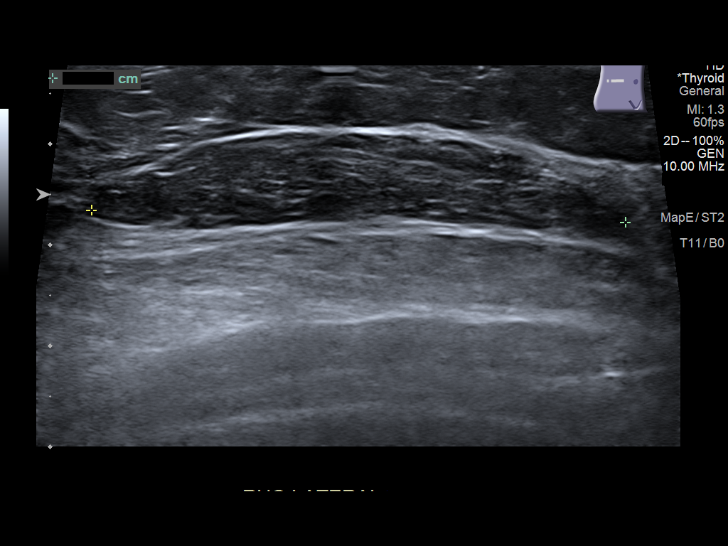
[im 9/12]
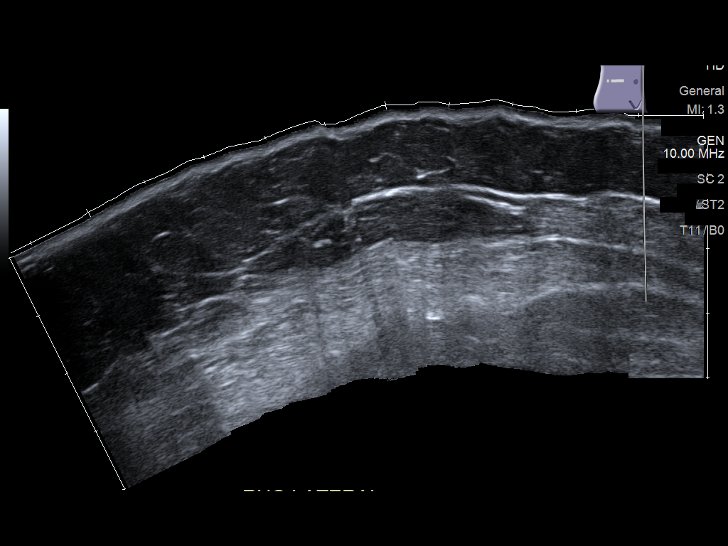
[im 10/12]
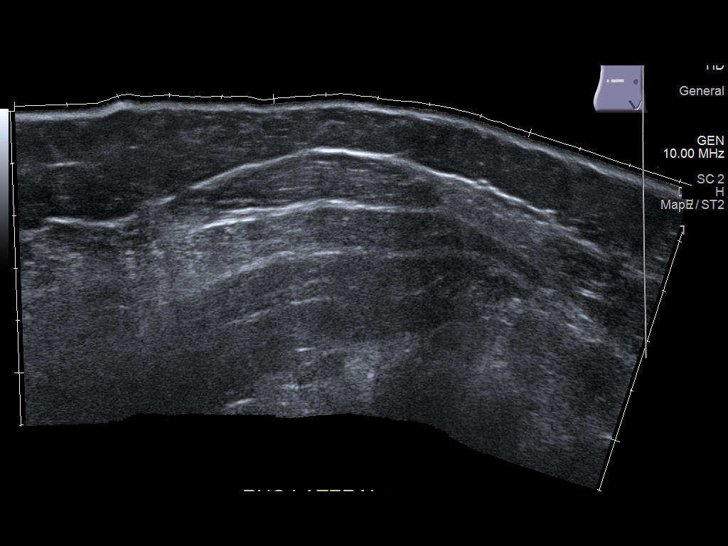
[im 11/12]
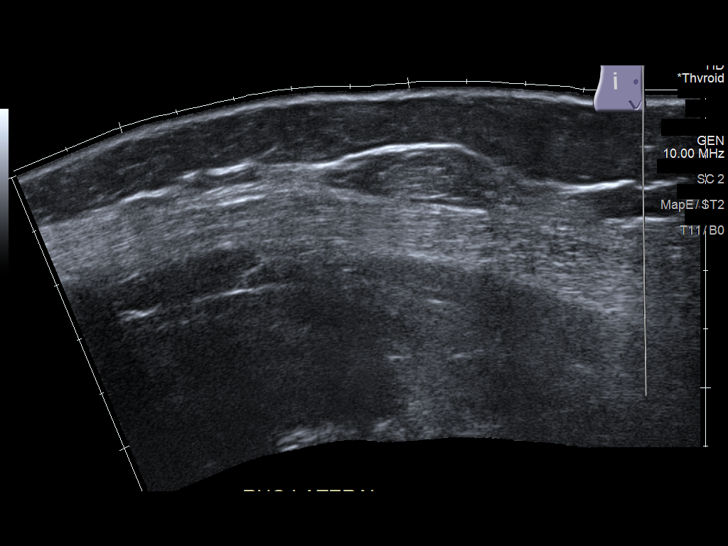
[im 12/12]
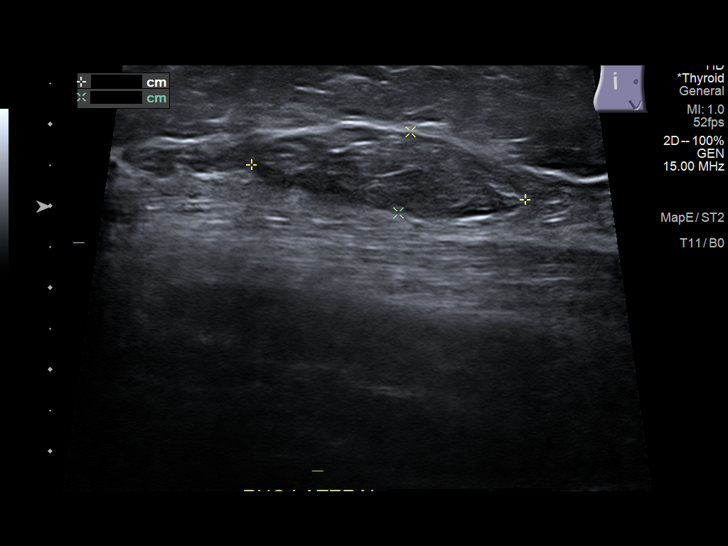

[12 of 12 positions shown; findings below may reference images not displayed]

FINDINGS: Longitudinal and transverse images obtained. There is an ovoid mass
in the right upper quadrant region slightly posterior to the rectus
muscles. This mass demonstrates slightly inhomogeneous echogenicity.
It is well-circumscribed. This lesion measures 3.4 x 1.0 x 5.3 cm.
It does not show appreciable vascularity. No other lesion is seen in
this vicinity. No associated fluid or inflammatory appearing
changes.
IMPRESSION: Ovoid mass slightly posterior to the rectus muscles in the right
upper quadrant region. This mass is well-circumscribed. It is
slightly inhomogeneous in echotexture. It does not have significant
vascularity. It most likely represents a lipomatous type lesion,
although it cannot be classified by ultrasound as a pure lipoma
given the inhomogeneity in the echotexture. It measures 3.4 x 1.0 x
5.3 cm.

If further imaging of this area is felt to be warranted, CT, ideally
with intravenous contrast, would be the imaging study of choice to
further evaluate.

## 2021-01-16 ENCOUNTER — Ambulatory Visit: Payer: 59 | Admitting: Nurse Practitioner

## 2021-02-22 ENCOUNTER — Ambulatory Visit: Payer: 59 | Admitting: Nurse Practitioner

## 2021-03-14 ENCOUNTER — Ambulatory Visit: Payer: 59 | Admitting: Nurse Practitioner

## 2021-03-14 ENCOUNTER — Encounter: Payer: Self-pay | Admitting: Nurse Practitioner

## 2021-03-14 ENCOUNTER — Other Ambulatory Visit: Payer: Self-pay

## 2021-03-14 VITALS — BP 125/86 | HR 94 | Temp 98.4°F | Wt 202.6 lb

## 2021-03-14 DIAGNOSIS — I440 Atrioventricular block, first degree: Secondary | ICD-10-CM

## 2021-03-14 DIAGNOSIS — Z113 Encounter for screening for infections with a predominantly sexual mode of transmission: Secondary | ICD-10-CM | POA: Diagnosis not present

## 2021-03-14 NOTE — Patient Instructions (Signed)
Chlamydia Test Why am I having this test? Chlamydia is a common STI (sexually transmitted infection), especially in people younger than 39 years of age. Chlamydia may be associated with other STIs, such as gonorrhea. Your health care provider may test you for chlamydia if you: Are sexually active. Have another STI. Have possible symptoms of chlamydia infection, such as pelvic pain or vaginal discharge. What is being tested? This test checks for the presence of the bacteria Chlamydia trachomatis, which causes chlamydia infection. What kind of sample is taken?   Depending on your symptoms, your health care provider may collect one of the following samples: A urine sample. This is collected in a germ-free (sterile) container that is given to you by your health care provider or the lab where the urine will be examined and tested. A fluid sample. This may be collected by swabbing one of the following: The vagina or the lower part of the womb (cervix). The part of your body that drains urine from your bladder (urethra). Your eye. The upper part of your throat behind the nose (nasopharynx). The rectum. How do I prepare for this test? Do not urinate for an hour before the test. Try to arrive with a full bladder. Do not use vaginal creams or douches before the test. Tell a health care provider about: All medicines you are taking, including vitamins, herbs, eye drops, creams, and over-the-counter medicines. Any surgeries you have had. Any medical conditions you have. Whether you are pregnant or may be pregnant. How are the results reported? Your test results will be reported as either positive or negative for chlamydia. What do the results mean? A positive result means that you have a chlamydia infection. A negative result is normal and means that you do not have a chlamydia infection. Talk with your health care provider about what your results mean. Questions to ask your health care  provider Ask your health care provider, or the department that is doing the test: When will my results be ready? How will I get my results? What are my treatment options? What other tests do I need? What are my next steps? Summary This test may be done to see if you have chlamydia, which is a common STI (sexually transmitted infection). Depending on your symptoms, your health care provider may collect samples of urine or fluid. The fluid sample is collected by swabbing your vagina, cervix, urethra, eye, nasopharynx, or rectum. Your test results will be reported as either positive or negative for chlamydia. This information is not intended to replace advice given to you by your health care provider. Make sure you discuss any questions you have with your health care provider. Document Revised: 02/02/2020 Document Reviewed: 02/02/2020 Elsevier Patient Education  2022 [employer].

## 2021-03-14 NOTE — Progress Notes (Signed)
BP 125/86   Pulse 94   Temp 98.4 F (36.9 C)   Wt 202 lb 9.6 oz (91.9 kg)   SpO2 97%   BMI 32.70 kg/m    Subjective:    Patient ID: Kathleen [name], female    DOB: May 08, 1980, 40 y.o.   MRN: [id number]  HPI: Kathleen [name] is a 39 y.o. female  Chief Complaint  Patient presents with   Irregular Heart Beat    Patient is here to follow up on irregular heart beat. Patient denies having any concerns at today's visit.    STD Screening    Patient is requesting STD screening at today's visit.    IRREGULAR HEART BEAT: Noted at physical 08/16/20 -- was seen by cardiology 11/13/20 == noted to have First-degree AV block, recommendation to monitor only at this time.  Patient with no symptoms. Aspirin: no Recurrent headaches: no Visual changes: no Palpitations: no Dyspnea: no Chest pain: no Lower extremity edema: occasional Dizzy/lightheaded: no   STD SCREENING Sexual activity:  In a Monogamous Relationship Contraception:  tubal ligation Recent unprotected intercourse: yes History of sexually transmitted diseases: no Previous sexually transmitted disease screening: yes Lifetime sexual partners: 15 Genital lesions: no Dysuria: no Swollen lymph nodes: no Fevers: no Rash: no   Relevant past medical, surgical, family and social history reviewed and updated as indicated. Interim medical history since our last visit reviewed. Allergies and medications reviewed and updated.  Review of Systems  Constitutional:  Negative for activity change, appetite change, diaphoresis, fatigue and fever.  Respiratory:  Negative for cough, chest tightness and shortness of breath.   Cardiovascular:  Negative for chest pain, palpitations and leg swelling.  Gastrointestinal: Negative.   Neurological: Negative.   Psychiatric/Behavioral: Negative.     Per HPI unless specifically indicated above     Objective:    BP 125/86   Pulse 94   Temp 98.4 F (36.9 C)   Wt 202 lb 9.6 oz (91.9 kg)    SpO2 97%   BMI 32.70 kg/m   Wt Readings from Last 3 Encounters:  03/14/21 202 lb 9.6 oz (91.9 kg)  11/13/20 205 lb (93 kg)  08/16/20 203 lb 12.8 oz (92.4 kg)    Physical Exam Vitals and nursing note reviewed.  Constitutional:      General: She is awake. She is not in acute distress.    Appearance: She is well-developed, well-groomed and overweight. She is not ill-appearing.  HENT:     Head: Normocephalic.     Right Ear: Hearing normal.     Left Ear: Hearing normal.  Eyes:     General: Lids are normal.        Right eye: No discharge.        Left eye: No discharge.     Conjunctiva/sclera: Conjunctivae normal.     Pupils: Pupils are equal, round, and reactive to light.  Neck:     Thyroid: No thyromegaly.     Vascular: No carotid bruit.  Cardiovascular:     Rate and Rhythm: Normal rate and regular rhythm.     Heart sounds: Normal heart sounds. No murmur heard.   No gallop.  Pulmonary:     Effort: Pulmonary effort is normal. No accessory muscle usage or respiratory distress.     Breath sounds: Normal breath sounds.  Abdominal:     General: Bowel sounds are normal.     Palpations: Abdomen is soft.  Musculoskeletal:     Cervical back: Normal range  of motion and neck supple.     Right lower leg: No edema.     Left lower leg: No edema.  Skin:    General: Skin is warm and dry.  Neurological:     Mental Status: She is alert and oriented to person, place, and time.  Psychiatric:        Attention and Perception: Attention normal.        Mood and Affect: Mood normal.        Speech: Speech normal.        Behavior: Behavior normal. Behavior is cooperative.        Thought Content: Thought content normal.   Results for orders placed or performed in visit on 08/16/20  CBC with Differential/Platelet  Result Value Ref Range   WBC 5.6 3.4 - 10.8 x10E3/uL   RBC 4.51 3.77 - 5.28 x10E6/uL   Hemoglobin 12.0 11.1 - 15.9 g/dL   Hematocrit 37.7 34.0 - 46.6 %   MCV 84 79 - 97 fL   MCH  26.6 26.6 - 33.0 pg   MCHC 31.8 31.5 - 35.7 g/dL   RDW 14.6 11.7 - 15.4 %   Platelets 277 150 - 450 x10E3/uL   Neutrophils 48 Not Estab. %   Lymphs 41 Not Estab. %   Monocytes 7 Not Estab. %   Eos 3 Not Estab. %   Basos 1 Not Estab. %   Neutrophils Absolute 2.7 1.4 - 7.0 x10E3/uL   Lymphocytes Absolute 2.3 0.7 - 3.1 x10E3/uL   Monocytes Absolute 0.4 0.1 - 0.9 x10E3/uL   EOS (ABSOLUTE) 0.2 0.0 - 0.4 x10E3/uL   Basophils Absolute 0.1 0.0 - 0.2 x10E3/uL   Immature Granulocytes 0 Not Estab. %   Immature Grans (Abs) 0.0 0.0 - 0.1 x10E3/uL  Comprehensive metabolic panel  Result Value Ref Range   Glucose 91 65 - 99 mg/dL   BUN 12 6 - 20 mg/dL   Creatinine, Ser 0.81 0.57 - 1.00 mg/dL   eGFR 95 >59 mL/min/1.73   BUN/Creatinine Ratio 15 9 - 23   Sodium 141 134 - 144 mmol/L   Potassium 4.3 3.5 - 5.2 mmol/L   Chloride 100 96 - 106 mmol/L   CO2 23 20 - 29 mmol/L   Calcium 9.2 8.7 - 10.2 mg/dL   Total Protein 7.4 6.0 - 8.5 g/dL   Albumin 4.7 3.8 - 4.8 g/dL   Globulin, Total 2.7 1.5 - 4.5 g/dL   Albumin/Globulin Ratio 1.7 1.2 - 2.2   Bilirubin Total 0.6 0.0 - 1.2 mg/dL   Alkaline Phosphatase 59 44 - 121 IU/L   AST 11 0 - 40 IU/L   ALT 10 0 - 32 IU/L  Lipid Panel w/o Chol/HDL Ratio  Result Value Ref Range   Cholesterol, Total 145 100 - 199 mg/dL   Triglycerides 38 0 - 149 mg/dL   HDL 72 >39 mg/dL   VLDL Cholesterol Cal 9 5 - 40 mg/dL   LDL Chol Calc (NIH) 64 0 - 99 mg/dL  TSH  Result Value Ref Range   TSH 0.540 0.450 - 4.500 uIU/mL  VITAMIN D 25 Hydroxy (Vit-D Deficiency, Fractures)  Result Value Ref Range   Vit D, 25-Hydroxy 19.9 (L) 30.0 - 100.0 ng/mL  Hepatitis C antibody  Result Value Ref Range   Hep C Virus Ab <0.1 0.0 - 0.9 s/co ratio  HIV Antibody (routine testing w rflx)  Result Value Ref Range   HIV Screen 4th Generation wRfx Non Reactive Non Reactive  Assessment & Plan:   Problem List Items Addressed This Visit       Cardiovascular and Mediastinum   First  degree AV block - Primary    No current treatment, continue collaboration with cardiology as needed.  No symptoms at this time, monitor only per recommendations.      Other Visit Diagnoses     Screen for STD (sexually transmitted disease)       STD screening today per request.   Relevant Orders   HIV Antibody (routine testing w rflx)   GC/Chlamydia Probe Amp   HSV(herpes simplex vrs) 1+2 ab-IgG   RPR        Follow up plan: Return in about 5 months (around 08/19/2021) for Annual physical.

## 2021-03-14 NOTE — Assessment & Plan Note (Signed)
No current treatment, continue collaboration with cardiology as needed.  No symptoms at this time, monitor only per recommendations.

## 2021-03-15 ENCOUNTER — Encounter: Payer: Self-pay | Admitting: Nurse Practitioner

## 2021-03-15 DIAGNOSIS — R768 Other specified abnormal immunological findings in serum: Secondary | ICD-10-CM | POA: Insufficient documentation

## 2021-03-15 LAB — RPR: RPR Ser Ql: NONREACTIVE

## 2021-03-15 LAB — HSV(HERPES SIMPLEX VRS) I + II AB-IGG
HSV 1 Glycoprotein G Ab, IgG: 21.8 index — ABNORMAL HIGH (ref 0.00–0.90)
HSV 2 IgG, Type Spec: >23.6 index — ABNORMAL HIGH (ref 0.00–0.90)

## 2021-03-15 LAB — HIV ANTIBODY (ROUTINE TESTING W REFLEX): HIV Screen 4th Generation wRfx: NONREACTIVE

## 2021-03-15 NOTE — Progress Notes (Signed)
Contacted via MyChart   Good evening Kathleen [name], your labs have returned with exception of urine which is still pending.  HIV is negative as is syphilis.  Herpes testing is showing elevation for both oral (cold sores) and genital -- as we discussed you may never have had outbreaks of these before, but this is something to monitor for and if you ever notice any lesions let me know right away so we can treat.  Any questions? Keep being awesome!!  Thank you for allowing me to participate in your care.  I appreciate you. Kindest regards, Henrine Screws'

## 2021-03-16 LAB — GC/CHLAMYDIA PROBE AMP
Chlamydia trachomatis, NAA: NEGATIVE
Neisseria Gonorrhoeae by PCR: NEGATIVE

## 2021-03-17 NOTE — Progress Notes (Signed)
Contacted via MyChart   Good evening Kathleen [name], your urine testing returned negative:)

## 2021-04-15 ENCOUNTER — Ambulatory Visit: Payer: 59 | Admitting: Nurse Practitioner

## 2021-08-10 NOTE — Patient Instructions (Incomplete)

## 2021-08-12 ENCOUNTER — Encounter: Payer: Self-pay | Admitting: Nurse Practitioner

## 2021-08-12 DIAGNOSIS — F418 Other specified anxiety disorders: Secondary | ICD-10-CM

## 2021-08-12 DIAGNOSIS — I10 Essential (primary) hypertension: Secondary | ICD-10-CM

## 2021-08-12 DIAGNOSIS — E559 Vitamin D deficiency, unspecified: Secondary | ICD-10-CM

## 2021-08-12 DIAGNOSIS — I440 Atrioventricular block, first degree: Secondary | ICD-10-CM

## 2021-08-12 DIAGNOSIS — E669 Obesity, unspecified: Secondary | ICD-10-CM

## 2021-08-12 DIAGNOSIS — Z0000 Encounter for general adult medical examination without abnormal findings: Secondary | ICD-10-CM

## 2021-08-12 DIAGNOSIS — R7989 Other specified abnormal findings of blood chemistry: Secondary | ICD-10-CM

## 2021-08-23 ENCOUNTER — Encounter: Payer: Self-pay | Admitting: Nurse Practitioner

## 2021-08-23 DIAGNOSIS — E669 Obesity, unspecified: Secondary | ICD-10-CM

## 2021-08-23 DIAGNOSIS — Z0000 Encounter for general adult medical examination without abnormal findings: Secondary | ICD-10-CM

## 2021-08-23 DIAGNOSIS — I440 Atrioventricular block, first degree: Secondary | ICD-10-CM

## 2021-08-23 DIAGNOSIS — R7989 Other specified abnormal findings of blood chemistry: Secondary | ICD-10-CM

## 2021-08-23 DIAGNOSIS — I871 Compression of vein: Secondary | ICD-10-CM

## 2021-08-23 DIAGNOSIS — F418 Other specified anxiety disorders: Secondary | ICD-10-CM

## 2021-08-23 DIAGNOSIS — I10 Essential (primary) hypertension: Secondary | ICD-10-CM

## 2021-08-23 DIAGNOSIS — E559 Vitamin D deficiency, unspecified: Secondary | ICD-10-CM

## 2021-09-01 NOTE — Patient Instructions (Signed)

## 2021-09-03 ENCOUNTER — Encounter: Payer: 59 | Admitting: Nurse Practitioner

## 2021-09-03 DIAGNOSIS — I10 Essential (primary) hypertension: Secondary | ICD-10-CM

## 2021-09-03 DIAGNOSIS — R7989 Other specified abnormal findings of blood chemistry: Secondary | ICD-10-CM

## 2021-09-03 DIAGNOSIS — Z0000 Encounter for general adult medical examination without abnormal findings: Secondary | ICD-10-CM

## 2021-09-03 DIAGNOSIS — E669 Obesity, unspecified: Secondary | ICD-10-CM

## 2021-09-03 DIAGNOSIS — E559 Vitamin D deficiency, unspecified: Secondary | ICD-10-CM

## 2021-09-03 DIAGNOSIS — I440 Atrioventricular block, first degree: Secondary | ICD-10-CM

## 2021-09-03 DIAGNOSIS — F418 Other specified anxiety disorders: Secondary | ICD-10-CM

## 2021-09-03 NOTE — Progress Notes (Signed)
Error

## 2021-09-15 NOTE — Patient Instructions (Incomplete)

## 2021-09-18 ENCOUNTER — Encounter: Payer: Self-pay | Admitting: Nurse Practitioner

## 2021-09-18 DIAGNOSIS — I440 Atrioventricular block, first degree: Secondary | ICD-10-CM

## 2021-09-18 DIAGNOSIS — E669 Obesity, unspecified: Secondary | ICD-10-CM

## 2021-09-18 DIAGNOSIS — D1622 Benign neoplasm of long bones of left lower limb: Secondary | ICD-10-CM

## 2021-09-18 DIAGNOSIS — F418 Other specified anxiety disorders: Secondary | ICD-10-CM

## 2021-09-18 DIAGNOSIS — Z0000 Encounter for general adult medical examination without abnormal findings: Secondary | ICD-10-CM

## 2021-09-18 DIAGNOSIS — R7989 Other specified abnormal findings of blood chemistry: Secondary | ICD-10-CM

## 2021-09-18 DIAGNOSIS — I10 Essential (primary) hypertension: Secondary | ICD-10-CM

## 2021-09-18 DIAGNOSIS — E559 Vitamin D deficiency, unspecified: Secondary | ICD-10-CM

## 2021-10-12 NOTE — Patient Instructions (Signed)

## 2021-10-15 ENCOUNTER — Encounter: Payer: Self-pay | Admitting: Nurse Practitioner

## 2021-10-15 ENCOUNTER — Ambulatory Visit (INDEPENDENT_AMBULATORY_CARE_PROVIDER_SITE_OTHER): Payer: 59 | Admitting: Nurse Practitioner

## 2021-10-15 VITALS — BP 118/82 | HR 82 | Temp 98.0°F | Ht 67.0 in | Wt 206.0 lb

## 2021-10-15 DIAGNOSIS — E559 Vitamin D deficiency, unspecified: Secondary | ICD-10-CM

## 2021-10-15 DIAGNOSIS — E669 Obesity, unspecified: Secondary | ICD-10-CM

## 2021-10-15 DIAGNOSIS — I10 Essential (primary) hypertension: Secondary | ICD-10-CM

## 2021-10-15 DIAGNOSIS — R7989 Other specified abnormal findings of blood chemistry: Secondary | ICD-10-CM

## 2021-10-15 DIAGNOSIS — I440 Atrioventricular block, first degree: Secondary | ICD-10-CM | POA: Diagnosis not present

## 2021-10-15 DIAGNOSIS — F418 Other specified anxiety disorders: Secondary | ICD-10-CM

## 2021-10-15 DIAGNOSIS — D1622 Benign neoplasm of long bones of left lower limb: Secondary | ICD-10-CM

## 2021-10-15 DIAGNOSIS — Z0000 Encounter for general adult medical examination without abnormal findings: Secondary | ICD-10-CM

## 2021-10-15 DIAGNOSIS — I871 Compression of vein: Secondary | ICD-10-CM

## 2021-10-15 NOTE — Assessment & Plan Note (Signed)
Continue compression hose daily and collaboration with vascular as needed.

## 2021-10-15 NOTE — Progress Notes (Signed)
BP 118/82   Pulse 82   Temp 98 F (36.7 C) (Oral)   Ht '5\' 7"'$  (1.702 m)   Wt 206 lb (93.4 kg)   SpO2 98%   BMI 32.26 kg/m    Subjective:    Patient ID: Kathleen [name], female    DOB: 09/20/1980, 41 y.o.   MRN: [id number]  HPI: Kathleen [name] is a 41 y.o. female presenting on 10/15/2021 for comprehensive medical examination. Current medical complaints include:none  She currently lives with: kids Menopausal Symptoms: no   HYPERTENSION No medications at this time -- in past took Lisinopril.    Has benign osteochondroma of left femur, last seen at [facility] 04/20/2018 with no intervention at this time.  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  Duplex was performed by vascular and no intervention recommended at this time, to wear compression hose.  Last saw cardiology 11/13/20 for 1st degree AV block, to return as needed. Hypertension status: stable  Satisfied with current treatment? yes Duration of hypertension: chronic BP monitoring frequency: not checking BP range: none BP medication side effects:  no Medication compliance: good compliance Aspirin: no Recurrent headaches: no Visual changes: no Palpitations: no Dyspnea: no Chest pain: no Lower extremity edema: occasional Dizzy/lightheaded: no  Depression Screen done today and results listed below:     10/15/2021    9:14 AM 08/16/2020    9:04 AM 08/15/2019    8:09 AM 05/25/2018   10:00 AM 02/22/2018    1:19 PM  Depression screen PHQ 2/9  Decreased Interest 0 0 0 0 1  Down, Depressed, Hopeless 0 0 0 0 0  PHQ - 2 Score 0 0 0 0 1  Altered sleeping 0  0  1  Tired, decreased energy 1  0  1  Change in appetite 0  0  0  Feeling bad or failure about yourself  0  0  1  Trouble concentrating 0  0  0  Moving slowly or fidgety/restless 0  0  0  Suicidal thoughts 0  0  0  PHQ-9 Score 1  0  4  Difficult doing work/chores Not difficult at all   Not difficult at all  Not difficult at all    The patient does not have a history of falls. I did not complete a risk assessment for falls. A plan of care for falls was not documented.  Functional Status Survey: Is the patient deaf or have difficulty hearing?: No Does the patient have difficulty seeing, even when wearing glasses/contacts?: No Does the patient have difficulty concentrating, remembering, or making decisions?: No Does the patient have difficulty walking or climbing stairs?: No Does the patient have difficulty dressing or bathing?: No Does the patient have difficulty doing errands alone such as visiting a doctor's office or shopping?: No    Past Medical History:  Past Medical History:  Diagnosis Date   Allergy     Surgical History:  Past Surgical History:  Procedure Laterality Date   STENT PLACEMENT VASCULAR ([place] HX)     TUBAL LIGATION      Medications:  Current Outpatient Medications on File Prior to Visit  Medication Sig   calcium-vitamin D (OSCAL WITH D) 500-5 MG-MCG tablet Take 1 tablet by mouth.   EPINEPHrine 0.3 mg/0.3 mL IJ SOAJ injection Inject 0.3 mg into the muscle as needed.    Multiple Vitamin (MULTIVITAMIN) tablet Take 1 tablet by mouth  daily.   vitamin B-12 (CYANOCOBALAMIN) 250 MCG tablet Take 250 mcg by mouth daily.   No current facility-administered medications on file prior to visit.    Allergies:  No Known Allergies  Social History:  Social History   Socioeconomic History   Marital status: Single    Spouse name: Not on file   Number of children: Not on file   Years of education: Not on file   Highest education level: Not on file  Occupational History   Not on file  Tobacco Use   Smoking status: Never   Smokeless tobacco: Never  Vaping Use   Vaping Use: Never used  Substance and Sexual Activity   Alcohol use: Yes    Alcohol/week: 1.0 standard drink of alcohol    Types: 1 Glasses of wine per week   Drug use: Not Currently    Sexual activity: Yes  Other Topics Concern   Not on file  Social History Narrative   Not on file   Social Determinants of Health   Financial Resource Strain: Low Risk  (08/16/2020)   Overall Financial Resource Strain (CARDIA)    Difficulty of Paying Living Expenses: Not hard at all  Food Insecurity: No Food Insecurity (08/16/2020)   Hunger Vital Sign    Worried About Running Out of Food in the Last Year: Never true    Ran Out of Food in the Last Year: Never true  Transportation Needs: No Transportation Needs (08/16/2020)   PRAPARE - Hydrologist (Medical): No    Lack of Transportation (Non-Medical): No  Physical Activity: Sufficiently Active (08/16/2020)   Exercise Vital Sign    Days of Exercise per Week: 3 days    Minutes of Exercise per Session: 50 min  Stress: No Stress Concern Present (08/16/2020)   [place]    Feeling of Stress : Not at all  Social Connections: Socially Isolated (08/16/2020)   Social Connection and Isolation Panel [NHANES]    Frequency of Communication with Friends and Family: More than three times a week    Frequency of Social Gatherings with Friends and Family: More than three times a week    Attends Religious Services: Never    Marine scientist or Organizations: No    Attends Music therapist: Never    Marital Status: Never married  Human resources officer Violence: Not on file   Social History   Tobacco Use  Smoking Status Never  Smokeless Tobacco Never   Social History   Substance and Sexual Activity  Alcohol Use Yes   Alcohol/week: 1.0 standard drink of alcohol   Types: 1 Glasses of wine per week    Family History:  Family History  Problem Relation Age of Onset   Congestive Heart Failure Mother    Pulmonary Hypertension Mother    COPD Mother    Irritable bowel syndrome Sister     Past medical history, surgical history,  medications, allergies, family history and social history reviewed with patient today and changes made to appropriate areas of the chart.   Review of Systems - negative All other ROS negative except what is listed above and in the HPI.      Objective:    BP 118/82   Pulse 82   Temp 98 F (36.7 C) (Oral)   Ht '5\' 7"'$  (1.702 m)   Wt 206 lb (93.4 kg)   SpO2 98%   BMI 32.26 kg/m  Wt Readings from Last 3 Encounters:  10/15/21 206 lb (93.4 kg)  03/14/21 202 lb 9.6 oz (91.9 kg)  11/13/20 205 lb (93 kg)    Physical Exam Vitals and nursing note reviewed.  Constitutional:      General: She is awake. She is not in acute distress.    Appearance: She is well-developed. She is not ill-appearing.  HENT:     Head: Normocephalic and atraumatic.     Right Ear: Hearing, tympanic membrane, ear canal and external ear normal. No drainage.     Left Ear: Hearing, tympanic membrane, ear canal and external ear normal. No drainage.     Nose: Nose normal.     Right Sinus: No maxillary sinus tenderness or frontal sinus tenderness.     Left Sinus: No maxillary sinus tenderness or frontal sinus tenderness.     Mouth/Throat:     Mouth: Mucous membranes are moist.     Pharynx: Oropharynx is clear. Uvula midline. No pharyngeal swelling, oropharyngeal exudate or posterior oropharyngeal erythema.  Eyes:     General: Lids are normal.        Right eye: No discharge.        Left eye: No discharge.     Extraocular Movements: Extraocular movements intact.     Conjunctiva/sclera: Conjunctivae normal.     Pupils: Pupils are equal, round, and reactive to light.     Visual Fields: Right eye visual fields normal and left eye visual fields normal.  Neck:     Thyroid: No thyromegaly.     Vascular: No carotid bruit.     Trachea: Trachea normal.  Cardiovascular:     Rate and Rhythm: Normal rate and regular rhythm.     Heart sounds: Normal heart sounds. No murmur heard.    No gallop.  Pulmonary:     Effort:  Pulmonary effort is normal. No accessory muscle usage or respiratory distress.     Breath sounds: Normal breath sounds.  Chest:  Breasts:    Right: Normal.     Left: Normal.  Abdominal:     General: Bowel sounds are normal.     Palpations: Abdomen is soft. There is no hepatomegaly or splenomegaly.     Tenderness: There is no abdominal tenderness.  Musculoskeletal:        General: Normal range of motion.     Cervical back: Normal range of motion and neck supple.     Right lower leg: No edema.     Left lower leg: No edema.  Lymphadenopathy:     Head:     Right side of head: No submental, submandibular, tonsillar, preauricular or posterior auricular adenopathy.     Left side of head: No submental, submandibular, tonsillar, preauricular or posterior auricular adenopathy.     Cervical: No cervical adenopathy.     Upper Body:     Right upper body: No supraclavicular, axillary or pectoral adenopathy.     Left upper body: No supraclavicular, axillary or pectoral adenopathy.  Skin:    General: Skin is warm and dry.     Capillary Refill: Capillary refill takes less than 2 seconds.     Findings: No rash.  Neurological:     Mental Status: She is alert and oriented to person, place, and time.     Gait: Gait is intact.     Deep Tendon Reflexes: Reflexes are normal and symmetric.     Reflex Scores:      Brachioradialis reflexes are 2+ on the right  side and 2+ on the left side.      Patellar reflexes are 2+ on the right side and 2+ on the left side. Psychiatric:        Attention and Perception: Attention normal.        Mood and Affect: Mood normal.        Speech: Speech normal.        Behavior: Behavior normal. Behavior is cooperative.        Thought Content: Thought content normal.        Judgment: Judgment normal.    Results for orders placed or performed in visit on 03/14/21  GC/Chlamydia Probe Amp   Specimen: Urine   UR  Result Value Ref Range   Chlamydia trachomatis, NAA Negative  Negative   Neisseria Gonorrhoeae by PCR Negative Negative  HIV Antibody (routine testing w rflx)  Result Value Ref Range   HIV Screen 4th Generation wRfx Non Reactive Non Reactive  HSV(herpes simplex vrs) 1+2 ab-IgG  Result Value Ref Range   HSV 1 Glycoprotein G Ab, IgG 21.80 (H) 0.00 - 0.90 index   HSV 2 IgG, Type Spec >23.60 (H) 0.00 - 0.90 index  RPR  Result Value Ref Range   RPR Ser Ql Non Reactive Non Reactive      Assessment & Plan:   Problem List Items Addressed This Visit       Cardiovascular and Mediastinum   Essential hypertension - Primary    Chronic, stable with BP at goal. Recommend she monitor BP at least a few mornings a week at home and document.  DASH diet at home.  Will continue diet focus at this time. Labs today: CMP, CBC, TSH, Vit D, Lipid.  Return in one year.       Relevant Orders   CBC with Differential/Platelet   Comprehensive metabolic panel   Lipid Panel w/o Chol/HDL Ratio   TSH   First degree AV block    Stable, no symptoms at this time.  Return to cardiology as needed.      Relevant Orders   Comprehensive metabolic panel   Lipid Panel w/o Chol/HDL Ratio   May-Thurner syndrome    Continue compression hose daily and collaboration with vascular as needed.      Relevant Orders   Comprehensive metabolic panel     Musculoskeletal and Integument   Osteochondroma of femur, left    Continue to collaborate with ortho at [facility] as needed.        Other   Low TSH level    Check TSH today and initiate medication or refer to endocrinology as needed.      Relevant Orders   T4, free   TSH   Obesity (BMI 30.0-34.9)    BMI 32.26. Recommended eating smaller high protein, low fat meals more frequently and exercising 30 mins a day 5 times a week with a goal of 10-15lb weight loss in the next 3 months. Patient voiced their understanding and motivation to adhere to these recommendations.       Vitamin D deficiency    History of low levels, recheck  today and continue supplements.      Relevant Orders   VITAMIN D 25 Hydroxy (Vit-D Deficiency, Fractures)   Other Visit Diagnoses     Encounter for annual physical exam       Annual physical with labs today and health maintenance reviewed with patient.        Follow up plan: Return in about 1 year (  around 10/16/2022) for Annual physical.   LABORATORY TESTING:  - Pap smear: Up To Date  IMMUNIZATIONS:   - Tdap: Tetanus vaccination status reviewed: last tetanus booster within 10 years. - Influenza: Up to date - Pneumovax: Not applicable - Prevnar: Not applicable - HPV: Not applicable - Zostavax vaccine: Not applicable  - Covid == has had x 2  SCREENING: -Mammogram: Not applicable start at age 15 - Colonoscopy: Not applicable  - Bone Density: Not applicable  -Hearing Test: Not applicable  -Spirometry: Not applicable   PATIENT COUNSELING:   Advised to take 1 mg of folate supplement per day if capable of pregnancy.   Sexuality: Discussed sexually transmitted diseases, partner selection, use of condoms, avoidance of unintended pregnancy  and contraceptive alternatives.   Advised to avoid cigarette smoking.  I discussed with the patient that most people either abstain from alcohol or drink within safe limits (<=14/week and <=4 drinks/occasion for males, <=7/weeks and <= 3 drinks/occasion for females) and that the risk for alcohol disorders and other health effects rises proportionally with the number of drinks per week and how often a drinker exceeds daily limits.  Discussed cessation/primary prevention of drug use and availability of treatment for abuse.   Diet: Encouraged to adjust caloric intake to maintain  or achieve ideal body weight, to reduce intake of dietary saturated fat and total fat, to limit sodium intake by avoiding high sodium foods and not adding table salt, and to maintain adequate dietary potassium and calcium preferably from fresh fruits, vegetables, and  low-fat dairy products.    Stressed the importance of regular exercise  Injury prevention: Discussed safety belts, safety helmets, smoke detector, smoking near bedding or upholstery.   Dental health: Discussed importance of regular tooth brushing, flossing, and dental visits.    NEXT PREVENTATIVE PHYSICAL DUE IN 1 YEAR. Return in about 1 year (around 10/16/2022) for Annual physical.

## 2021-10-15 NOTE — Assessment & Plan Note (Signed)
BMI 32.26. Recommended eating smaller high protein, low fat meals more frequently and exercising 30 mins a day 5 times a week with a goal of 10-15lb weight loss in the next 3 months. Patient voiced their understanding and motivation to adhere to these recommendations.

## 2021-10-15 NOTE — Assessment & Plan Note (Signed)
Chronic, stable with BP at goal. Recommend she monitor BP at least a few mornings a week at home and document.  DASH diet at home.  Will continue diet focus at this time. Labs today: CMP, CBC, TSH, Vit D, Lipid.  Return in one year.

## 2021-10-15 NOTE — Assessment & Plan Note (Signed)
Continue to collaborate with ortho at [facility] as needed.

## 2021-10-15 NOTE — Assessment & Plan Note (Addendum)
History of low levels, recheck today and continue supplements.

## 2021-10-15 NOTE — Assessment & Plan Note (Signed)
Stable, no symptoms at this time.  Return to cardiology as needed. 

## 2021-10-15 NOTE — Assessment & Plan Note (Signed)
Check TSH today and initiate medication or refer to endocrinology as needed.

## 2021-10-16 ENCOUNTER — Other Ambulatory Visit: Payer: Self-pay | Admitting: Nurse Practitioner

## 2021-10-16 DIAGNOSIS — D649 Anemia, unspecified: Secondary | ICD-10-CM

## 2021-10-16 DIAGNOSIS — R7989 Other specified abnormal findings of blood chemistry: Secondary | ICD-10-CM

## 2021-10-16 LAB — CBC WITH DIFFERENTIAL/PLATELET
Basophils Absolute: 0.1 10*3/uL (ref 0.0–0.2)
Basos: 1 %
EOS (ABSOLUTE): 0.2 10*3/uL (ref 0.0–0.4)
Eos: 3 %
Hematocrit: 33.9 % — ABNORMAL LOW (ref 34.0–46.6)
Hemoglobin: 10.8 g/dL — ABNORMAL LOW (ref 11.1–15.9)
Immature Grans (Abs): 0 10*3/uL (ref 0.0–0.1)
Immature Granulocytes: 0 %
Lymphocytes Absolute: 2.3 10*3/uL (ref 0.7–3.1)
Lymphs: 37 %
MCH: 25.8 pg — ABNORMAL LOW (ref 26.6–33.0)
MCHC: 31.9 g/dL (ref 31.5–35.7)
MCV: 81 fL (ref 79–97)
Monocytes Absolute: 0.4 10*3/uL (ref 0.1–0.9)
Monocytes: 7 %
Neutrophils Absolute: 3.2 10*3/uL (ref 1.4–7.0)
Neutrophils: 52 %
Platelets: 373 10*3/uL (ref 150–450)
RBC: 4.18 x10E6/uL (ref 3.77–5.28)
RDW: 15.6 % — ABNORMAL HIGH (ref 11.7–15.4)
WBC: 6.1 10*3/uL (ref 3.4–10.8)

## 2021-10-16 LAB — LIPID PANEL W/O CHOL/HDL RATIO
Cholesterol, Total: 131 mg/dL (ref 100–199)
HDL: 63 mg/dL (ref >39)
LDL Chol Calc (NIH): 56 mg/dL (ref 0–99)
Triglycerides: 51 mg/dL (ref 0–149)
VLDL Cholesterol Cal: 12 mg/dL (ref 5–40)

## 2021-10-16 LAB — COMPREHENSIVE METABOLIC PANEL
ALT: 10 IU/L (ref 0–32)
AST: 12 IU/L (ref 0–40)
Albumin/Globulin Ratio: 1.6 (ref 1.2–2.2)
Albumin: 4.3 g/dL (ref 3.9–4.9)
Alkaline Phosphatase: 63 IU/L (ref 44–121)
BUN/Creatinine Ratio: 12 (ref 9–23)
BUN: 9 mg/dL (ref 6–20)
Bilirubin Total: 0.5 mg/dL (ref 0.0–1.2)
CO2: 21 mmol/L (ref 20–29)
Calcium: 9.1 mg/dL (ref 8.7–10.2)
Chloride: 104 mmol/L (ref 96–106)
Creatinine, Ser: 0.75 mg/dL (ref 0.57–1.00)
Globulin, Total: 2.7 g/dL (ref 1.5–4.5)
Glucose: 92 mg/dL (ref 70–99)
Potassium: 4.5 mmol/L (ref 3.5–5.2)
Sodium: 140 mmol/L (ref 134–144)
Total Protein: 7 g/dL (ref 6.0–8.5)
eGFR: 104 mL/min/{1.73_m2} (ref >59)

## 2021-10-16 LAB — TSH: TSH: 0.331 u[IU]/mL — ABNORMAL LOW (ref 0.450–4.500)

## 2021-10-16 LAB — VITAMIN D 25 HYDROXY (VIT D DEFICIENCY, FRACTURES): Vit D, 25-Hydroxy: 13.1 ng/mL — ABNORMAL LOW (ref 30.0–100.0)

## 2021-10-16 LAB — T4, FREE: Free T4: 1.38 ng/dL (ref 0.82–1.77)

## 2021-10-16 MED ORDER — CHOLECALCIFEROL 1.25 MG (50000 UT) PO TABS: 1.0000 | ORAL_TABLET | ORAL | 4 refills | Status: DC

## 2021-10-16 NOTE — Progress Notes (Signed)
Contacted via [place] -- needs lab visit in 4 weeks please   Good evening Vee, your labs have returned: - CBC is showing some mild anemia with hemoglobin and hematocrit levels on low side.  I would like to recheck this via outpatient labs in 4 weeks please, I also want to recheck thyroid as this is showing low levels again on TSH.  My staff will call to schedule. - Vitamin D level remains on low side, please start taking weekly supplement again, I will send in. - Remainder of labs are stable.  Any questions? Keep being awesome!!  Thank you for allowing me to participate in your care.  I appreciate you. Kindest regards, Halsey Hammen

## 2021-11-15 ENCOUNTER — Other Ambulatory Visit: Payer: 59

## 2021-11-15 DIAGNOSIS — D649 Anemia, unspecified: Secondary | ICD-10-CM

## 2021-11-15 DIAGNOSIS — R7989 Other specified abnormal findings of blood chemistry: Secondary | ICD-10-CM

## 2021-11-16 ENCOUNTER — Other Ambulatory Visit: Payer: Self-pay | Admitting: Nurse Practitioner

## 2021-11-16 ENCOUNTER — Encounter: Payer: Self-pay | Admitting: Nurse Practitioner

## 2021-11-16 LAB — IRON AND TIBC
Iron Saturation: 5 % — CL (ref 15–55)
Iron: 21 ug/dL — ABNORMAL LOW (ref 27–159)
Total Iron Binding Capacity: 451 ug/dL — ABNORMAL HIGH (ref 250–450)
UIBC: 430 ug/dL — ABNORMAL HIGH (ref 131–425)

## 2021-11-16 LAB — VITAMIN B12: Vitamin B-12: 646 pg/mL (ref 232–1245)

## 2021-11-16 LAB — CBC WITH DIFFERENTIAL/PLATELET
Basophils Absolute: 0 10*3/uL (ref 0.0–0.2)
Basos: 1 %
EOS (ABSOLUTE): 0.1 10*3/uL (ref 0.0–0.4)
Eos: 2 %
Hematocrit: 34.1 % (ref 34.0–46.6)
Hemoglobin: 10.5 g/dL — ABNORMAL LOW (ref 11.1–15.9)
Immature Grans (Abs): 0 10*3/uL (ref 0.0–0.1)
Immature Granulocytes: 0 %
Lymphocytes Absolute: 1.9 10*3/uL (ref 0.7–3.1)
Lymphs: 30 %
MCH: 25.7 pg — ABNORMAL LOW (ref 26.6–33.0)
MCHC: 30.8 g/dL — ABNORMAL LOW (ref 31.5–35.7)
MCV: 84 fL (ref 79–97)
Monocytes Absolute: 0.5 10*3/uL (ref 0.1–0.9)
Monocytes: 7 %
Neutrophils Absolute: 3.8 10*3/uL (ref 1.4–7.0)
Neutrophils: 60 %
Platelets: 380 10*3/uL (ref 150–450)
RBC: 4.08 x10E6/uL (ref 3.77–5.28)
RDW: 16.6 % — ABNORMAL HIGH (ref 11.7–15.4)
WBC: 6.4 10*3/uL (ref 3.4–10.8)

## 2021-11-16 LAB — FERRITIN: Ferritin: 11 ng/mL — ABNORMAL LOW (ref 15–150)

## 2021-11-16 LAB — TSH: TSH: 0.896 u[IU]/mL (ref 0.450–4.500)

## 2021-11-16 LAB — THYROID PEROXIDASE ANTIBODY: Thyroperoxidase Ab SerPl-aCnc: 12 IU/mL (ref 0–34)

## 2021-11-16 LAB — T4, FREE: Free T4: 1.47 ng/dL (ref 0.82–1.77)

## 2021-11-16 MED ORDER — IRON-VITAMIN C 65-125 MG PO TABS
1.0000 | ORAL_TABLET | Freq: Every day | ORAL | 0 refills | Status: DC
Start: 2021-11-16 — End: 2021-12-31

## 2021-11-16 NOTE — Progress Notes (Signed)
Contacted via MyChart -- needs follow-up with me in office in 6 weeks please:)   Good morning Kathleen [name], your labs have returned.  You are showing some iron deficiency anemia present.  Do you have heavier menstrual cycles?  Any recent blood in stool? I want you to start taking iron supplement daily, I will send this in, and then follow-up with me in office in 6 weeks.  B12 level is normal and thyroid labs are normal on this check.  Any questions? Keep being amazing!!  Thank you for allowing me to participate in your care.  I appreciate you. Kindest regards, Shiloh Swopes

## 2021-11-20 LAB — FECAL OCCULT BLOOD, IMMUNOCHEMICAL: Fecal Occult Bld: NEGATIVE

## 2021-11-20 NOTE — Progress Notes (Signed)
Contacted via MyChart   Good afternoon Kathleen [name], stool is negative for blood!!  Great news!!  Is suspect anemia is coming more from heavier menstrual cycles.  We will monitor and recheck upcoming.  Any questions? Keep being amazing!!  Thank you for allowing me to participate in your care.  I appreciate you. Kindest regards, Yasira Engelson

## 2021-12-04 ENCOUNTER — Ambulatory Visit: Payer: 59 | Admitting: Nurse Practitioner

## 2021-12-05 ENCOUNTER — Encounter: Payer: Self-pay | Admitting: Nurse Practitioner

## 2021-12-05 ENCOUNTER — Ambulatory Visit: Payer: 59 | Admitting: Nurse Practitioner

## 2021-12-05 VITALS — BP 128/86 | HR 90 | Temp 98.3°F | Wt 198.8 lb

## 2021-12-05 DIAGNOSIS — Z113 Encounter for screening for infections with a predominantly sexual mode of transmission: Secondary | ICD-10-CM

## 2021-12-05 DIAGNOSIS — R768 Other specified abnormal immunological findings in serum: Secondary | ICD-10-CM

## 2021-12-05 NOTE — Progress Notes (Signed)
BP 128/86 (BP Location: Left Arm, Patient Position: Sitting)   Pulse 90   Temp 98.3 F (36.8 C) (Oral)   Wt 198 lb 12.8 oz (90.2 kg)   SpO2 98%   BMI 31.14 kg/m    Subjective:    Patient ID: Kathleen [name], female    DOB: 1980/09/28, 41 y.o.   MRN: [id number]  HPI: Kathleen [name] is a 41 y.o. female  Chief Complaint  Patient presents with   Labs Only    Patient says she is here to just have lab work done due to not mentioning it at her yearly appointment.   STD SCREENING Would like STD screening today. Sexual activity:  In a Monogamous Relationship Contraception: no Recent unprotected intercourse: yes History of sexually transmitted diseases: no Previous sexually transmitted disease screening: yes Lifetime sexual partners: 15 Genital lesions: no Dysuria: no Swollen lymph nodes: no Fevers: no Rash: no   Relevant past medical, surgical, family and social history reviewed and updated as indicated. Interim medical history since our last visit reviewed. Allergies and medications reviewed and updated.  Review of Systems  Constitutional:  Negative for activity change, appetite change, diaphoresis, fatigue and fever.  Respiratory:  Negative for cough, chest tightness and shortness of breath.   Cardiovascular:  Negative for chest pain, palpitations and leg swelling.  Gastrointestinal: Negative.   Neurological: Negative.   Psychiatric/Behavioral: Negative.      Per HPI unless specifically indicated above     Objective:    BP 128/86 (BP Location: Left Arm, Patient Position: Sitting)   Pulse 90   Temp 98.3 F (36.8 C) (Oral)   Wt 198 lb 12.8 oz (90.2 kg)   SpO2 98%   BMI 31.14 kg/m   Wt Readings from Last 3 Encounters:  12/05/21 198 lb 12.8 oz (90.2 kg)  10/15/21 206 lb (93.4 kg)  03/14/21 202 lb 9.6 oz (91.9 kg)    Physical Exam Vitals and nursing note reviewed.  Constitutional:      General: She is awake. She is not in acute distress.    Appearance:  She is well-developed, well-groomed and overweight. She is not ill-appearing.  HENT:     Head: Normocephalic.     Right Ear: Hearing normal.     Left Ear: Hearing normal.  Eyes:     General: Lids are normal.        Right eye: No discharge.        Left eye: No discharge.     Conjunctiva/sclera: Conjunctivae normal.     Pupils: Pupils are equal, round, and reactive to light.  Neck:     Thyroid: No thyromegaly.     Vascular: No carotid bruit.  Cardiovascular:     Rate and Rhythm: Normal rate and regular rhythm.     Heart sounds: Normal heart sounds. No murmur heard.    No gallop.  Pulmonary:     Effort: Pulmonary effort is normal. No accessory muscle usage or respiratory distress.     Breath sounds: Normal breath sounds.  Abdominal:     General: Bowel sounds are normal.     Palpations: Abdomen is soft.  Musculoskeletal:     Cervical back: Normal range of motion and neck supple.     Right lower leg: No edema.     Left lower leg: No edema.  Skin:    General: Skin is warm and dry.  Neurological:     Mental Status: She is alert and oriented to person, place,  and time.  Psychiatric:        Attention and Perception: Attention normal.        Mood and Affect: Mood normal.        Speech: Speech normal.        Behavior: Behavior normal. Behavior is cooperative.        Thought Content: Thought content normal.    Results for orders placed or performed in visit on 11/15/21  Fecal occult blood, imunochemical   Specimen: Stool   ST  Result Value Ref Range   Fecal Occult Bld Negative Negative  Vitamin B12  Result Value Ref Range   Vitamin B-12 646 232 - 1,245 pg/mL  Ferritin  Result Value Ref Range   Ferritin 11 (L) 15 - 150 ng/mL  Iron Binding Cap (TIBC)([facility])  Result Value Ref Range   Total Iron Binding Capacity 451 (H) 250 - 450 ug/dL   UIBC 430 (H) 131 - 425 ug/dL   Iron 21 (L) 27 - 159 ug/dL   Iron Saturation 5 (LL) 15 - 55 %  T4, free  Result Value Ref Range    Free T4 1.47 0.82 - 1.77 ng/dL  Thyroid peroxidase antibody  Result Value Ref Range   Thyroperoxidase Ab SerPl-aCnc 12 0 - 34 IU/mL  TSH  Result Value Ref Range   TSH 0.896 0.450 - 4.500 uIU/mL  CBC with Differential/Platelet  Result Value Ref Range   WBC 6.4 3.4 - 10.8 x10E3/uL   RBC 4.08 3.77 - 5.28 x10E6/uL   Hemoglobin 10.5 (L) 11.1 - 15.9 g/dL   Hematocrit 34.1 34.0 - 46.6 %   MCV 84 79 - 97 fL   MCH 25.7 (L) 26.6 - 33.0 pg   MCHC 30.8 (L) 31.5 - 35.7 g/dL   RDW 16.6 (H) 11.7 - 15.4 %   Platelets 380 150 - 450 x10E3/uL   Neutrophils 60 Not Estab. %   Lymphs 30 Not Estab. %   Monocytes 7 Not Estab. %   Eos 2 Not Estab. %   Basos 1 Not Estab. %   Neutrophils Absolute 3.8 1.4 - 7.0 x10E3/uL   Lymphocytes Absolute 1.9 0.7 - 3.1 x10E3/uL   Monocytes Absolute 0.5 0.1 - 0.9 x10E3/uL   EOS (ABSOLUTE) 0.1 0.0 - 0.4 x10E3/uL   Basophils Absolute 0.0 0.0 - 0.2 x10E3/uL   Immature Granulocytes 0 Not Estab. %   Immature Grans (Abs) 0.0 0.0 - 0.1 x10E3/uL      Assessment & Plan:   Problem List Items Addressed This Visit       Other   HSV-2 seropositive - Primary    Noted past labs, treat as needed.  No recent outbreaks.      Other Visit Diagnoses     Screening for STD (sexually transmitted disease)       Full STD screening today per patient request.   Relevant Orders   GC/Chlamydia Probe Amp   HIV Antibody (routine testing w rflx)   RPR   HSV(herpes simplex vrs) 1+2 ab-IgG        Follow up plan: Return for as scheduled 12/30/21.

## 2021-12-05 NOTE — Assessment & Plan Note (Addendum)
Noted past labs, treat as needed.  No recent outbreaks.

## 2021-12-05 NOTE — Patient Instructions (Signed)
Safe Sex Practicing safe sex means taking steps before and during sex to reduce your risk of: Getting an STI (sexually transmitted infection). Giving your partner an STI. Unwanted or unplanned pregnancy. How to practice safe sex Ways you can practice safe sex  Limit your sexual partners to only one partner who is having sex with only you. Avoid using alcohol and drugs before having sex. Alcohol and drugs can affect your judgment. Before having sex with a new partner: Talk to your partner about past partners, past STIs, and drug use. Get screened for STIs and discuss the results with your partner. Ask your partner to get screened too. Check your body regularly for sores, blisters, rashes, or unusual discharge. If you notice any of these problems, visit your health care provider. Avoid sexual contact if you have symptoms of an infection or you are being treated for an STI. While having sex, use a condom. Make sure to: Use a condom every time you have vaginal, oral, or anal sex. Both females and males should wear condoms during oral sex. Keep condoms in place from the beginning to the end of sexual activity. Use a latex condom, if possible. Latex condoms offer the best protection. Use only water-based lubricants with a condom. Using petroleum-based lubricants or oils will weaken the condom and increase the chance that it will break. Ways your health care provider can help you practice safe sex  See your health care provider for regular screenings, exams, and tests for STIs. Talk with your health care provider about what kind of birth control (contraception) is best for you. Get vaccinated against hepatitis B and human papillomavirus (HPV). If you are at risk of being infected with HIV (human immunodeficiency virus), talk with your health care provider about taking a prescription medicine to prevent HIV infection. You are at risk for HIV if you: Are a man who has sex with other men. Are  sexually active with more than one partner. Take drugs by injection. Have a sex partner who has HIV. Have unprotected sex. Have sex with someone who has sex with both men and women. Have had an STI. Follow these instructions at home: Take over-the-counter and prescription medicines only as told by your health care provider. Keep all follow-up visits. This is important. Where to find more information Centers for Disease Control and Prevention: www.cdc.gov Planned Parenthood: www.plannedparenthood.org Office on Women's Health: www.womenshealth.gov Summary Practicing safe sex means taking steps before and during sex to reduce your risk getting an STI, giving your partner an STI, and having an unwanted or unplanned pregnancy. Before having sex with a new partner, talk to your partner about past partners, past STIs, and drug use. Use a condom every time you have vaginal, oral, or anal sex. Both females and males should wear condoms during oral sex. Check your body regularly for sores, blisters, rashes, or unusual discharge. If you notice any of these problems, visit your health care provider. See your health care provider for regular screenings, exams, and tests for STIs. This information is not intended to replace advice given to you by your health care provider. Make sure you discuss any questions you have with your health care provider. Document Revised: 08/29/2019 Document Reviewed: 08/29/2019 Elsevier Patient Education  2023 Elsevier Inc.  

## 2021-12-06 LAB — HIV ANTIBODY (ROUTINE TESTING W REFLEX): HIV Screen 4th Generation wRfx: NONREACTIVE

## 2021-12-06 LAB — RPR: RPR Ser Ql: NONREACTIVE

## 2021-12-06 LAB — HSV(HERPES SIMPLEX VRS) I + II AB-IGG
HSV 1 Glycoprotein G Ab, IgG: 28 index — ABNORMAL HIGH (ref 0.00–0.90)
HSV 2 IgG, Type Spec: 22.8 index — ABNORMAL HIGH (ref 0.00–0.90)

## 2021-12-06 NOTE — Progress Notes (Signed)
Contacted via MyChart   Good afternoon Kathleen [name], your labs have returned with exception of urine which is still pending.  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  If no flares you are good, but if one presents let me know. Syphilis and HIV are negative. Any questions? Keep being amazing!!  Thank you for allowing me to participate in your care.  I appreciate you. Kindest regards, Tevis Conger

## 2021-12-08 LAB — GC/CHLAMYDIA PROBE AMP
Chlamydia trachomatis, NAA: NEGATIVE
Neisseria Gonorrhoeae by PCR: NEGATIVE

## 2021-12-08 NOTE — Progress Notes (Signed)
Contacted via MyChart   Urine testing returned negative:)

## 2021-12-28 DIAGNOSIS — D509 Iron deficiency anemia, unspecified: Secondary | ICD-10-CM | POA: Insufficient documentation

## 2021-12-28 NOTE — Patient Instructions (Signed)
Iron Deficiency Anemia, Adult  Iron deficiency anemia is when you do not have enough red blood cells or hemoglobin in your blood. This happens because you have too little iron in your body. Hemoglobin carries oxygen to parts of the body. Anemia can cause your body to not get enough oxygen. What are the causes? Not eating enough foods that have iron in them. The body not being able to take in iron well. Blood loss. What increases the risk? Having menstrual periods. Being pregnant. What are the signs or symptoms? Pale skin, lips, and nails. Weakness, dizziness, and getting tired easily. Feeling like you cannot breathe well when moving (shortness of breath). Cold hands and feet. Mild anemia may not cause any symptoms. How is this treated? This condition is treated by finding out why you do not have enough iron and then getting more iron. It may include: Adding foods to your diet that have a lot of iron. Taking iron pills (supplements). If you are pregnant or breastfeeding, you may need to take extra iron. Your diet often does not provide the amount of iron that you need. Getting more vitamin C in your diet. Vitamin C helps your body take in iron. You may need to take iron pills with a glass of orange juice or vitamin C pills. Medicines to make heavy menstrual periods lighter. Surgery or testing procedures to find what is causing the condition. You may need blood tests to see if treatment is working. If the treatment does not seem to be working, you may need more tests. Follow these instructions at home: Medicines Take over-the-counter and prescription medicines only as told by your doctor. This includes iron pills and vitamins. Taking them as told is important because too much iron can be harmful. Take iron pills when your stomach is empty. If you cannot handle this, take them with food. Do not drink milk or take antacids at the same time as your iron pills. Iron pills may turn your poop  (stool)black. If you cannot handle taking iron pills by mouth, ask your doctor about getting iron through: An IV tube. A shot (injection) into a muscle. Eating and drinking Talk with your doctor before changing the foods you eat. Your doctor may tell you to eat foods that have a lot of iron, such as: Liver. Low-fat (lean) beef. Breads and cereals that have iron added to them. Eggs. Dried fruit. Dark green, leafy vegetables. Eat fresh fruits and vegetables that are high in vitamin C. They help your body use iron. Foods with a lot of vitamin C include: Oranges. Peppers. Tomatoes. Mangoes. Managing constipation If you are taking iron pills, they may cause trouble pooping (constipation). To prevent or treat this, you may need to: Drink enough fluid to keep your pee (urine) pale yellow. Take over-the-counter or prescription medicines. Eat foods that are high in fiber. These include beans, whole grains, and fresh fruits and vegetables. Limit foods that are high in fat and sugar. These include fried or sweet foods. General instructions Return to your normal activities when your doctor says that it is safe. Keep all follow-up visits. Contact a doctor if: You feel like you may vomit (nauseous), or you vomit. You feel weak. You get light-headed when getting up from sitting or lying down. You are sweating for no reason. You have trouble pooping. You have worse breathing with physical activity. You have heaviness in your chest. Get help right away if: You faint. If this happens, do not drive yourself   to the hospital. You have a fast heartbeat, or a heartbeat that does not feel regular. Summary Iron deficiency anemia happens when you have too little iron in your body. This condition is treated by finding out why you do not have enough iron in your body and then getting more iron. Take over-the-counter and prescription medicines only as told by your doctor. Eat fresh fruits and vegetables  that are high in vitamin C. Contact a doctor if you have trouble pooping or feel weak. This information is not intended to replace advice given to you by your health care provider. Make sure you discuss any questions you have with your health care provider. Document Revised: 05/02/2021 Document Reviewed: 05/02/2021 Elsevier Patient Education  2023 Elsevier Inc.  

## 2021-12-30 ENCOUNTER — Encounter: Payer: Self-pay | Admitting: Nurse Practitioner

## 2021-12-30 ENCOUNTER — Ambulatory Visit (INDEPENDENT_AMBULATORY_CARE_PROVIDER_SITE_OTHER): Payer: 59 | Admitting: Nurse Practitioner

## 2021-12-30 VITALS — BP 130/86 | HR 71 | Temp 98.0°F | Ht 67.0 in | Wt 204.1 lb

## 2021-12-30 DIAGNOSIS — I871 Compression of vein: Secondary | ICD-10-CM

## 2021-12-30 DIAGNOSIS — D500 Iron deficiency anemia secondary to blood loss (chronic): Secondary | ICD-10-CM | POA: Diagnosis not present

## 2021-12-30 NOTE — Assessment & Plan Note (Signed)
Chronic, ongoing issue.  Recommend she wear compression hose daily at work, as notices this more on 12 hours shifts.  May obtain compression hose at [facility].  Return to vascular if ongoing issues or if pain presents.

## 2021-12-30 NOTE — Assessment & Plan Note (Signed)
Chronic, ongoing.  She is taking daily iron supplement at this time.  Recommend she continue this with a little orange juice, is taking gummy iron.  Will repeat labs today and screen for Celiac and Sickle Cell due to family history.  Consider hematology referral if ongoing low levels.  Ensure iron rich diet at home.

## 2021-12-30 NOTE — Progress Notes (Signed)
BP 130/86   Pulse 71   Temp 98 F (36.7 C) (Oral)   Ht '5\' 7"'$  (1.702 m)   Wt 204 lb 1.6 oz (92.6 kg)   SpO2 98%   BMI 31.97 kg/m    Subjective:    Patient ID: Kathleen [name], female    DOB: 1980/04/10, 41 y.o.   MRN: [id number]  HPI: Kathleen [name] is a 41 y.o. female  Chief Complaint  Patient presents with   Iron Follow Up    Patient says she feels fine.    Leg Swelling    Patient says she has noticed swelling in her L leg. Patient says she has a history of stent placement in her L leg. Patient says she had it placed in 2013/2014. Patient says she notices some aching with the swelling.   ANEMIA Noted on labs 11/15/21 with iron level 21 and continues on B12.  Her recent menstrual cycles have been heavier.  She did start daily iron supplement.  Her mother had Sickle Cell. Anemia status: uncontrolled Etiology of anemia: ?menstrual cycles Duration of anemia treatment: a few months Compliance with treatment: good compliance Iron supplementation side effects: no Severity of anemia: moderate Fatigue: yes Decreased exercise tolerance: no  Dyspnea on exertion: no Palpitations: no Bleeding: no Pica: yes   SWELLING LEFT LEG: Having current issues with swelling left leg, she reports the swelling varies -- worse at times.  No pain, but tight feeling.  Has history of similar in past with underlying May-Thurner Syndrome.  Reports having stent placed in left leg in [place] in 2018, no records available to review.  Last saw vascular 06/01/2018.  Not currently wear compression hose, is on feet 12 hours at work -- night shift.   Recurrent headaches: no Visual changes: no Palpitations: no Dyspnea: no Chest pain: no Lower extremity edema: yes Dizzy/lightheaded: no   Relevant past medical, surgical, family and social history reviewed and updated as indicated. Interim medical history since our last visit reviewed. Allergies and medications reviewed and updated.  Review of Systems   Constitutional:  Negative for activity change, appetite change, diaphoresis, fatigue and fever.  Respiratory:  Negative for cough, chest tightness and shortness of breath.   Cardiovascular:  Positive for leg swelling. Negative for chest pain and palpitations.  Gastrointestinal: Negative.   Neurological: Negative.   Psychiatric/Behavioral: Negative.      Per HPI unless specifically indicated above     Objective:    BP 130/86   Pulse 71   Temp 98 F (36.7 C) (Oral)   Ht '5\' 7"'$  (1.702 m)   Wt 204 lb 1.6 oz (92.6 kg)   SpO2 98%   BMI 31.97 kg/m   Wt Readings from Last 3 Encounters:  12/30/21 204 lb 1.6 oz (92.6 kg)  12/05/21 198 lb 12.8 oz (90.2 kg)  10/15/21 206 lb (93.4 kg)    Physical Exam Vitals and nursing note reviewed.  Constitutional:      General: She is awake. She is not in acute distress.    Appearance: She is well-developed, well-groomed and overweight. She is not ill-appearing.  HENT:     Head: Normocephalic.     Right Ear: Hearing normal.     Left Ear: Hearing normal.  Eyes:     General: Lids are normal.        Right eye: No discharge.        Left eye: No discharge.     Conjunctiva/sclera: Conjunctivae normal.  Pupils: Pupils are equal, round, and reactive to light.  Neck:     Thyroid: No thyromegaly.     Vascular: No carotid bruit.  Cardiovascular:     Rate and Rhythm: Normal rate and regular rhythm.     Heart sounds: Normal heart sounds. No murmur heard.    No gallop.     Comments: Negative Homans bilaterally. Pulmonary:     Effort: Pulmonary effort is normal. No accessory muscle usage or respiratory distress.     Breath sounds: Normal breath sounds.  Abdominal:     General: Bowel sounds are normal.     Palpations: Abdomen is soft.  Musculoskeletal:     Cervical back: Normal range of motion and neck supple.     Right lower leg: 1+ Edema present.     Left lower leg: 2+ Edema present.  Skin:    General: Skin is warm and dry.  Neurological:      Mental Status: She is alert and oriented to person, place, and time.  Psychiatric:        Attention and Perception: Attention normal.        Mood and Affect: Mood normal.        Speech: Speech normal.        Behavior: Behavior normal. Behavior is cooperative.        Thought Content: Thought content normal.    Results for orders placed or performed in visit on 12/05/21  GC/Chlamydia Probe Amp   Specimen: Urine   UR  Result Value Ref Range   Chlamydia trachomatis, NAA Negative Negative   Neisseria Gonorrhoeae by PCR Negative Negative  HIV Antibody (routine testing w rflx)  Result Value Ref Range   HIV Screen 4th Generation wRfx Non Reactive Non Reactive  RPR  Result Value Ref Range   RPR Ser Ql Non Reactive Non Reactive  HSV(herpes simplex vrs) 1+2 ab-IgG  Result Value Ref Range   HSV 1 Glycoprotein G Ab, IgG 28.00 (H) 0.00 - 0.90 index   HSV 2 IgG, Type Spec 22.80 (H) 0.00 - 0.90 index      Assessment & Plan:   Problem List Items Addressed This Visit       Cardiovascular and Mediastinum   May-Thurner syndrome    Chronic, ongoing issue.  Recommend she wear compression hose daily at work, as notices this more on 12 hours shifts.  May obtain compression hose at [facility].  Return to vascular if ongoing issues or if pain presents.        Other   Iron deficiency anemia - Primary    Chronic, ongoing.  She is taking daily iron supplement at this time.  Recommend she continue this with a little orange juice, is taking gummy iron.  Will repeat labs today and screen for Celiac and Sickle Cell due to family history.  Consider hematology referral if ongoing low levels.  Ensure iron rich diet at home.      Relevant Orders   CBC with Differential/Platelet   Iron Binding Cap (TIBC)([facility])   Ferritin   Celiac Disease Panel   Sickle Cell Scr     Follow up plan: Return in about 5 months (around 06/01/2022) for HTN/HLD, ANEMIA.

## 2021-12-31 ENCOUNTER — Other Ambulatory Visit: Payer: Self-pay | Admitting: Nurse Practitioner

## 2021-12-31 DIAGNOSIS — D649 Anemia, unspecified: Secondary | ICD-10-CM | POA: Insufficient documentation

## 2021-12-31 DIAGNOSIS — Z832 Family history of diseases of the blood and blood-forming organs and certain disorders involving the immune mechanism: Secondary | ICD-10-CM

## 2021-12-31 NOTE — Progress Notes (Signed)
Contacted via [place] -- however please call her to ensure she received below message: Kathleen [name], your sickle cell screening did return positive -- this does not necessarily mean you have sickle cell, however with your mother's history I am going to send you to hematology for further work-up.  For now stop the iron supplement, as iron is not recommended in sickle cell disease -- until we have this further assessed please stop supplement.  Hematology will call you to schedule appointment, please listen out to phone for this call.  Any questions? Keep being amazing!!  Thank you for allowing me to participate in your care.  I appreciate you. Kindest regards, Herald Vallin

## 2021-12-31 NOTE — Progress Notes (Signed)
Contacted via MyChart   Good morning Licia, I am waiting on the Sickle Cell labs and Celiac disease labs to returned and will alert you when they do.  You iron is still on low side but now normal low + hemoglobin/hematocrit remain low -- if sickle cell labs return negative (which I suspect they will as CBC has not reported abnormal shape of cell and white blood cell count is normal) then we will go up to iron twice per day for 6 weeks and have you return for outpatient labs -- but do not do this yet until remainder of labs return.  Any questions?   Keep being awesome!!  Thank you for allowing me to participate in your care.  I appreciate you. Kindest regards, Sakoya Win

## 2022-01-01 LAB — CELIAC DISEASE PANEL
Endomysial IgA: NEGATIVE
IgA/Immunoglobulin A, Serum: 210 mg/dL (ref 87–352)
Transglutaminase IgA: <2 U/mL (ref 0–3)

## 2022-01-01 LAB — FERRITIN: Ferritin: 10 ng/mL — ABNORMAL LOW (ref 15–150)

## 2022-01-01 LAB — CBC WITH DIFFERENTIAL/PLATELET
Basophils Absolute: 0 10*3/uL (ref 0.0–0.2)
Basos: 0 %
EOS (ABSOLUTE): 0.1 10*3/uL (ref 0.0–0.4)
Eos: 2 %
Hematocrit: 33.7 % — ABNORMAL LOW (ref 34.0–46.6)
Hemoglobin: 10.7 g/dL — ABNORMAL LOW (ref 11.1–15.9)
Immature Grans (Abs): 0 10*3/uL (ref 0.0–0.1)
Immature Granulocytes: 0 %
Lymphocytes Absolute: 2.2 10*3/uL (ref 0.7–3.1)
Lymphs: 33 %
MCH: 25.8 pg — ABNORMAL LOW (ref 26.6–33.0)
MCHC: 31.8 g/dL (ref 31.5–35.7)
MCV: 81 fL (ref 79–97)
Monocytes Absolute: 0.5 10*3/uL (ref 0.1–0.9)
Monocytes: 8 %
Neutrophils Absolute: 3.9 10*3/uL (ref 1.4–7.0)
Neutrophils: 57 %
Platelets: 286 10*3/uL (ref 150–450)
RBC: 4.15 x10E6/uL (ref 3.77–5.28)
RDW: 16.2 % — ABNORMAL HIGH (ref 11.7–15.4)
WBC: 6.8 10*3/uL (ref 3.4–10.8)

## 2022-01-01 LAB — IRON AND TIBC
Iron Saturation: 8 % — CL (ref 15–55)
Iron: 35 ug/dL (ref 27–159)
Total Iron Binding Capacity: 436 ug/dL (ref 250–450)
UIBC: 401 ug/dL (ref 131–425)

## 2022-01-01 LAB — SICKLE CELL SCREEN: Sickle Cell Screen: POSITIVE — AB

## 2022-01-03 ENCOUNTER — Encounter: Payer: Self-pay | Admitting: Nurse Practitioner

## 2022-01-08 ENCOUNTER — Other Ambulatory Visit: Payer: Self-pay | Admitting: *Deleted

## 2022-01-08 ENCOUNTER — Encounter: Payer: Self-pay | Admitting: Oncology

## 2022-01-08 ENCOUNTER — Inpatient Hospital Stay: Payer: 59 | Attending: Oncology | Admitting: Oncology

## 2022-01-08 ENCOUNTER — Inpatient Hospital Stay: Payer: 59

## 2022-01-08 VITALS — BP 139/93 | HR 100 | Temp 96.9°F | Resp 18 | Wt 200.7 lb

## 2022-01-08 DIAGNOSIS — D508 Other iron deficiency anemias: Secondary | ICD-10-CM

## 2022-01-08 DIAGNOSIS — E559 Vitamin D deficiency, unspecified: Secondary | ICD-10-CM | POA: Diagnosis not present

## 2022-01-08 DIAGNOSIS — D509 Iron deficiency anemia, unspecified: Secondary | ICD-10-CM | POA: Diagnosis not present

## 2022-01-08 DIAGNOSIS — N920 Excessive and frequent menstruation with regular cycle: Secondary | ICD-10-CM | POA: Insufficient documentation

## 2022-01-08 DIAGNOSIS — Z832 Family history of diseases of the blood and blood-forming organs and certain disorders involving the immune mechanism: Secondary | ICD-10-CM | POA: Diagnosis not present

## 2022-01-08 NOTE — Progress Notes (Signed)
Hematology/Oncology Consult note [facility] [phone] [phone]  Patient Care Team: Venita Lick, NP as PCP - General (Nurse Practitioner) Kate Sable, MD as PCP - Cardiology (Cardiology)   Name of the patient: Kathleen [name]  [id number]  [birth year]    Reason for referral-iron deficiency anemia   Referring physician-Jolene Deri Fuelling, NP  Date of visit: 01/08/22   History of presenting illness-patient is a 41 year old African-American female referred for iron deficiency anemiaLabs from 12/30/2021 showed white count of 6.8, H&H of 10.7/33.7 with an MCV of 81 and a platelet count of 286.  Ferritin levels were low at 10 with an iron saturation of 8%.  TSH B12 was normal.  She also had a sickle cell screen done which was positive she denies any family history of any blood disorders.  Does report that her menstrual cycles have been heavy especially in the first couple of days.  Denies any blood loss in her stool or urine.  Denies any dark melanotic stools.  Denies any consistent use of NSAIDs.  Reports some ongoing fatigue.  She has not tried oral iron yet.  ECOG PS- 0  Pain scale- 0   Review of systems- Review of Systems  Constitutional:  Positive for malaise/fatigue. Negative for chills, fever and weight loss.  HENT:  Negative for congestion, ear discharge and nosebleeds.   Eyes:  Negative for blurred vision.  Respiratory:  Negative for cough, hemoptysis, sputum production, shortness of breath and wheezing.   Cardiovascular:  Negative for chest pain, palpitations, orthopnea and claudication.  Gastrointestinal:  Negative for abdominal pain, blood in stool, constipation, diarrhea, heartburn, melena, nausea and vomiting.  Genitourinary:  Negative for dysuria, flank pain, frequency, hematuria and urgency.  Musculoskeletal:  Negative for back pain, joint pain and myalgias.  Skin:  Negative for rash.  Neurological:  Negative for  dizziness, tingling, focal weakness, seizures, weakness and headaches.  Endo/Heme/Allergies:  Does not bruise/bleed easily.  Psychiatric/Behavioral:  Negative for depression and suicidal ideas. The patient does not have insomnia.     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   Obesity (BMI 30.0-34.9) 02/22/2018   Essential hypertension 02/22/2018     Past Medical History:  Diagnosis Date   Allergy      Past Surgical History:  Procedure Laterality Date   STENT PLACEMENT VASCULAR ([facility] HX)     TUBAL LIGATION      Social History   Socioeconomic History   Marital status: Single    Spouse name: Not on file   Number of children: Not on file   Years of education: Not on file   Highest education level: Not on file  Occupational History   Not on file  Tobacco Use   Smoking status: Never   Smokeless tobacco: Never  Vaping Use   Vaping Use: Never used  Substance and Sexual Activity   Alcohol use: Yes    Alcohol/week: 1.0 standard drink of alcohol    Types: 1 Glasses of wine per week   Drug use: Not Currently   Sexual activity: Yes  Other Topics Concern   Not on  file  Social History Narrative   Not on file   Social Determinants of Health   Financial Resource Strain: Low Risk  (08/16/2020)   Overall Financial Resource Strain (CARDIA)    Difficulty of Paying Living Expenses: Not hard at all  Food Insecurity: No Food Insecurity (08/16/2020)   Hunger Vital Sign    Worried About Running Out of Food in the Last Year: Never true    Ran Out of Food in the Last Year: Never true  Transportation Needs: No  Transportation Needs (08/16/2020)   PRAPARE - Hydrologist (Medical): No    Lack of Transportation (Non-Medical): No  Physical Activity: Sufficiently Active (08/16/2020)   Exercise Vital Sign    Days of Exercise per Week: 3 days    Minutes of Exercise per Session: 50 min  Stress: No Stress Concern Present (08/16/2020)   [place]    Feeling of Stress : Not at all  Social Connections: Socially Isolated (08/16/2020)   Social Connection and Isolation Panel [NHANES]    Frequency of Communication with Friends and Family: More than three times a week    Frequency of Social Gatherings with Friends and Family: More than three times a week    Attends Religious Services: Never    Marine scientist or Organizations: No    Attends Music therapist: Never    Marital Status: Never married  Human resources officer Violence: Not on file     Family History  Problem Relation Age of Onset   Congestive Heart Failure Mother    Pulmonary Hypertension Mother    COPD Mother    Sickle cell anemia Mother    Irritable bowel syndrome Sister      Current Outpatient Medications:    Cholecalciferol (VITAMIN D3) 1.25 MG (50000 UT) CAPS, Take 1 capsule by mouth once a week., Disp: , Rfl:    Multiple Vitamin (MULTIVITAMIN) tablet, Take 1 tablet by mouth daily., Disp: , Rfl:    vitamin B-12 (CYANOCOBALAMIN) 250 MCG tablet, Take 250 mcg by mouth daily., Disp: , Rfl:    calcium-vitamin D (OSCAL WITH D) 500-5 MG-MCG tablet, Take 1 tablet by mouth. (Patient not taking: Reported on 01/08/2022), Disp: , Rfl:    EPINEPHrine 0.3 mg/0.3 mL IJ SOAJ injection, Inject 0.3 mg into the muscle as needed.  (Patient not taking: Reported on 01/08/2022), Disp: , Rfl:    Physical exam:  Vitals:   01/08/22 1112  BP: (!) 139/93  Pulse: 100  Resp: 18  Temp: (!) 96.9 F (36.1 C)  SpO2: 100%  Weight: 200 lb 11.2 oz (91 kg)    Physical Exam Constitutional:      General: She is not in acute distress. Cardiovascular:     Rate and Rhythm: Normal rate and regular rhythm.     Heart sounds: Normal heart sounds.  Pulmonary:     Effort: Pulmonary effort is normal.     Breath sounds: Normal breath sounds.  Abdominal:     General: Bowel sounds are normal.     Palpations: Abdomen is soft.  Skin:    General: Skin is warm and dry.  Neurological:     Mental Status: She is alert and oriented to person, place, and time.           Latest Ref Rng & Units 10/15/2021    9:47 AM  CMP  Glucose 70 - 99 mg/dL 92   BUN 6 -  20 mg/dL 9   Creatinine 0.57 - 1.00 mg/dL 0.75   Sodium 134 - 144 mmol/L 140   Potassium 3.5 - 5.2 mmol/L 4.5   Chloride 96 - 106 mmol/L 104   CO2 20 - 29 mmol/L 21   Calcium 8.7 - 10.2 mg/dL 9.1   Total Protein 6.0 - 8.5 g/dL 7.0   Total Bilirubin 0.0 - 1.2 mg/dL 0.5   Alkaline Phos 44 - 121 IU/L 63   AST 0 - 40 IU/L 12   ALT 0 - 32 IU/L 10       Latest Ref Rng & Units 12/30/2021    9:08 AM  CBC  WBC 3.4 - 10.8 x10E3/uL 6.8   Hemoglobin 11.1 - 15.9 g/dL 10.7   Hematocrit 34.0 - 46.6 % 33.7   Platelets 150 - 450 x10E3/uL 286      Assessment and plan- Patient is a 41 y.o. female referred for iron deficiency anemia  Iron deficiency anemia likely secondary to menorrhagia.  We discussed oral versus IV iron.  Patient prefers to get IV iron.  Her insurance would approve Venofer and I would recommend 5 doses of Venofer 200 mg IV over the next 2 to 3 weeks.  We will repeat CBC ferritin and iron studies in 2 to 3 months and see her thereafter for a video visit.  Discussed risks and benefits of IV iron including all but not limited to possible risk of infusion anaphylactic reaction.  Patient understands and agrees to proceed as planned.  She will need urine pregnancy test before beginning Venofer  I have her to see GYN to manage her menorrhagia.  Positive sickle cell screen: I will check  hemoglobin electrophoresis with the next set of labs   Thank you for this kind referral and the opportunity to participate in the care of this  Patient   Visit Diagnosis 1. Vitamin D deficiency     Dr. Randa Evens, MD, MPH [facility] at [facility] [phone] 01/08/2022

## 2022-01-13 ENCOUNTER — Inpatient Hospital Stay: Payer: 59

## 2022-01-13 VITALS — BP 120/84 | HR 93 | Temp 98.4°F | Resp 16

## 2022-01-13 DIAGNOSIS — D508 Other iron deficiency anemias: Secondary | ICD-10-CM

## 2022-01-13 DIAGNOSIS — D509 Iron deficiency anemia, unspecified: Secondary | ICD-10-CM | POA: Diagnosis not present

## 2022-01-13 MED ORDER — SODIUM CHLORIDE 0.9 % IV SOLN
Freq: Once | INTRAVENOUS | Status: AC
  Filled 2022-01-13: qty 250

## 2022-01-13 MED ORDER — SODIUM CHLORIDE 0.9 % IV SOLN
200.0000 mg | INTRAVENOUS | Status: DC
  Administered 2022-01-13: 200 mg via INTRAVENOUS
  Filled 2022-01-13: qty 200

## 2022-01-13 NOTE — Patient Instructions (Signed)

## 2022-01-16 MED FILL — Iron Sucrose Inj 20 MG/ML (Fe Equiv): INTRAVENOUS | Qty: 10 | Status: AC

## 2022-01-17 ENCOUNTER — Inpatient Hospital Stay: Payer: 59

## 2022-01-20 ENCOUNTER — Inpatient Hospital Stay: Payer: 59

## 2022-01-21 MED FILL — Iron Sucrose Inj 20 MG/ML (Fe Equiv): INTRAVENOUS | Qty: 10 | Status: AC

## 2022-01-22 ENCOUNTER — Inpatient Hospital Stay: Payer: 59

## 2022-01-23 ENCOUNTER — Inpatient Hospital Stay: Payer: 59

## 2022-01-23 VITALS — BP 123/97 | HR 79 | Temp 97.9°F | Resp 20

## 2022-01-23 DIAGNOSIS — D508 Other iron deficiency anemias: Secondary | ICD-10-CM

## 2022-01-23 DIAGNOSIS — D509 Iron deficiency anemia, unspecified: Secondary | ICD-10-CM | POA: Diagnosis not present

## 2022-01-23 MED ORDER — SODIUM CHLORIDE 0.9 % IV SOLN
Freq: Once | INTRAVENOUS | Status: AC
  Filled 2022-01-23: qty 250

## 2022-01-23 MED ORDER — SODIUM CHLORIDE 0.9 % IV SOLN
200.0000 mg | INTRAVENOUS | Status: DC
  Administered 2022-01-23: 200 mg via INTRAVENOUS
  Filled 2022-01-23: qty 10

## 2022-01-27 ENCOUNTER — Inpatient Hospital Stay: Payer: 59

## 2022-01-27 VITALS — BP 132/97 | HR 92 | Temp 98.6°F | Resp 16

## 2022-01-27 DIAGNOSIS — D509 Iron deficiency anemia, unspecified: Secondary | ICD-10-CM | POA: Diagnosis not present

## 2022-01-27 DIAGNOSIS — D508 Other iron deficiency anemias: Secondary | ICD-10-CM

## 2022-01-27 MED ORDER — SODIUM CHLORIDE 0.9 % IV SOLN
Freq: Once | INTRAVENOUS | Status: AC
  Filled 2022-01-27: qty 250

## 2022-01-27 MED ORDER — SODIUM CHLORIDE 0.9 % IV SOLN
200.0000 mg | INTRAVENOUS | Status: DC
  Administered 2022-01-27: 200 mg via INTRAVENOUS
  Filled 2022-01-27: qty 200

## 2022-01-28 MED FILL — Iron Sucrose Inj 20 MG/ML (Fe Equiv): INTRAVENOUS | Qty: 10 | Status: AC

## 2022-01-29 ENCOUNTER — Inpatient Hospital Stay: Payer: 59

## 2022-01-29 VITALS — BP 132/101 | HR 80 | Temp 97.8°F | Resp 18

## 2022-01-29 DIAGNOSIS — D509 Iron deficiency anemia, unspecified: Secondary | ICD-10-CM | POA: Diagnosis not present

## 2022-01-29 DIAGNOSIS — D508 Other iron deficiency anemias: Secondary | ICD-10-CM

## 2022-01-29 MED ORDER — SODIUM CHLORIDE 0.9 % IV SOLN
Freq: Once | INTRAVENOUS | Status: AC
  Filled 2022-01-29: qty 250

## 2022-01-29 MED ORDER — SODIUM CHLORIDE 0.9 % IV SOLN
200.0000 mg | INTRAVENOUS | Status: DC
  Administered 2022-01-29: 200 mg via INTRAVENOUS
  Filled 2022-01-29: qty 200

## 2022-01-30 MED FILL — Iron Sucrose Inj 20 MG/ML (Fe Equiv): INTRAVENOUS | Qty: 10 | Status: AC

## 2022-01-31 ENCOUNTER — Inpatient Hospital Stay: Payer: 59

## 2022-01-31 VITALS — BP 114/78 | HR 81 | Temp 97.6°F

## 2022-01-31 DIAGNOSIS — D509 Iron deficiency anemia, unspecified: Secondary | ICD-10-CM | POA: Diagnosis not present

## 2022-01-31 DIAGNOSIS — D508 Other iron deficiency anemias: Secondary | ICD-10-CM

## 2022-01-31 MED ORDER — SODIUM CHLORIDE 0.9 % IV SOLN
Freq: Once | INTRAVENOUS | Status: AC
  Filled 2022-01-31: qty 250

## 2022-01-31 MED ORDER — SODIUM CHLORIDE 0.9 % IV SOLN
200.0000 mg | INTRAVENOUS | Status: DC
  Administered 2022-01-31: 200 mg via INTRAVENOUS
  Filled 2022-01-31: qty 200

## 2022-02-03 ENCOUNTER — Encounter (INDEPENDENT_AMBULATORY_CARE_PROVIDER_SITE_OTHER): Payer: Self-pay

## 2022-02-03 ENCOUNTER — Inpatient Hospital Stay: Payer: 59

## 2022-02-22 ENCOUNTER — Other Ambulatory Visit: Payer: Self-pay

## 2022-02-22 ENCOUNTER — Emergency Department
Admission: EM | Admit: 2022-02-22 | Discharge: 2022-02-23 | Disposition: A | Discharge status: Home / Self Care | Payer: 59 | Attending: Emergency Medicine | Admitting: Emergency Medicine

## 2022-02-22 ENCOUNTER — Emergency Department: Payer: 59

## 2022-02-22 ENCOUNTER — Encounter: Payer: Self-pay | Admitting: Radiology

## 2022-02-22 DIAGNOSIS — R519 Headache, unspecified: Secondary | ICD-10-CM | POA: Diagnosis present

## 2022-02-22 DIAGNOSIS — U071 COVID-19: Secondary | ICD-10-CM | POA: Insufficient documentation

## 2022-02-22 LAB — RESP PANEL BY RT-PCR (FLU A&B, COVID) ARPGX2
Influenza A by PCR: NEGATIVE
Influenza B by PCR: NEGATIVE
SARS Coronavirus 2 by RT PCR: POSITIVE — AB

## 2022-02-22 MED ORDER — BENZONATATE 100 MG PO CAPS: 100.0000 mg | ORAL_CAPSULE | Freq: Three times a day (TID) | ORAL | 0 refills | Status: AC | PRN

## 2022-02-22 NOTE — ED Provider Notes (Signed)
[facility] Provider Note  Patient Contact: 11:58 PM (approximate)   History   Generalized Body Aches   HPI  Kathleen [name] is a 41 y.o. female presents to the emergency department with headache, body aches, rhinorrhea, nasal congestion and fever for the past 3 to 4 days.  No chest pain, chest tightness or abdominal pain.  No vomiting or diarrhea.      Physical Exam   Triage Vital Signs: ED Triage Vitals  Enc Vitals Group     BP 02/22/22 2147 (!) 149/112     Pulse Rate 02/22/22 2147 95     Resp 02/22/22 2147 18     Temp 02/22/22 2147 99.1 F (37.3 C)     Temp Source 02/22/22 2147 Oral     SpO2 02/22/22 2147 96 %     Weight 02/22/22 2148 200 lb (90.7 kg)     Height --      Head Circumference --      Peak Flow --      Pain Score 02/22/22 2148 7     Pain Loc --      Pain Edu? --      Excl. in [place]? --     Most recent vital signs: Vitals:   02/22/22 2147  BP: (!) 149/112  Pulse: 95  Resp: 18  Temp: 99.1 F (37.3 C)  SpO2: 96%     Constitutional: Alert and oriented. Patient is lying supine. Eyes: Conjunctivae are normal. PERRL. EOMI. Head: Atraumatic. ENT:      Ears: Tympanic membranes are mildly injected with mild effusion bilaterally.       Nose: No congestion/rhinnorhea.      Mouth/Throat: Mucous membranes are moist. Posterior pharynx is mildly erythematous.  Hematological/Lymphatic/Immunilogical: No cervical lymphadenopathy.  Cardiovascular: Normal rate, regular rhythm. Normal S1 and S2.  Good peripheral circulation. Respiratory: Normal respiratory effort without tachypnea or retractions. Lungs CTAB. Good air entry to the bases with no decreased or absent breath sounds. Gastrointestinal: Bowel sounds 4 quadrants. Soft and nontender to palpation. No guarding or rigidity. No palpable masses. No distention. No CVA tenderness. Musculoskeletal: Full range of motion to all extremities. No gross deformities appreciated. Neurologic:   Normal speech and language. No gross focal neurologic deficits are appreciated.  Skin:  Skin is warm, dry and intact. No rash noted. Psychiatric: Mood and affect are normal. Speech and behavior are normal. Patient exhibits appropriate insight and judgement.    ED Results / Procedures / Treatments   Labs (all labs ordered are listed, but only abnormal results are displayed) Labs Reviewed  RESP PANEL BY RT-PCR (FLU A&B, COVID) ARPGX2 - Abnormal; Notable for the following components:      Result Value   SARS Coronavirus 2 by RT PCR POSITIVE (*)    All other components within normal limits        RADIOLOGY  I personally viewed and evaluated these images as part of my medical decision making, as well as reviewing the written report by the radiologist.  ED Provider Interpretation: No consolidations, opacities or infiltrates to suggest pneumonia.   PROCEDURES:  Critical Care performed: No  Procedures   MEDICATIONS ORDERED IN ED: Medications - No data to display   IMPRESSION / MDM / [place] / ED COURSE  I reviewed the triage vital signs and the nursing notes.  Assessment and plan COVID-[age] year old female presents to the emergency department with viral URI-like symptoms.  She tested positive for COVID-19.  Chest x-ray unremarkable.  Recommended Tessalon Perles for cough and increase hydration at home.  Return precautions were given to return with new or worsening symptoms.      FINAL CLINICAL IMPRESSION(S) / ED DIAGNOSES   Final diagnoses:  [id number]     Rx / DC Orders   ED Discharge Orders          Ordered    benzonatate (TESSALON PERLES) 100 MG capsule  3 times daily PRN        02/22/22 2356             Note:  This document was prepared using Dragon voice recognition software and may include unintentional dictation errors.   Vallarie Mare [place], PA-C 02/23/22 0001    Nance Pear, MD 02/23/22 [phone]

## 2022-02-22 NOTE — Discharge Instructions (Signed)
You can take two Tessalon Perles at night before bed.  Rest and stay hydrated at home.

## 2022-02-22 NOTE — ED Triage Notes (Signed)
Pt arrives with c/o generalized body aches, headache, and cough that started about a week ago. Pt unsure of fevers.

## 2022-02-22 NOTE — ED Notes (Signed)
Pt ambulated to toilet and back to bed. 

## 2022-02-24 ENCOUNTER — Telehealth: Payer: Self-pay

## 2022-02-24 NOTE — Telephone Encounter (Signed)
Transition Care Management Follow-up Telephone Call Date of discharge and from where: 02/23/22, [facility] How have you been since you were released from the hospital? Better than I did.  Any questions or concerns? No  Items Reviewed: Did the pt receive and understand the discharge instructions provided? Yes  Medications obtained and verified? Yes  Other? No  Any new allergies since your discharge? No  Dietary orders reviewed? Yes Do you have support at home? Yes   Home Care and Equipment/Supplies: Were home health services ordered? no If so, what is the name of the agency? N/A  Has the agency set up a time to come to the patient's home? not applicable Were any new equipment or medical supplies ordered?  No What is the name of the medical supply agency? N/A Were you able to get the supplies/equipment? not applicable Do you have any questions related to the use of the equipment or supplies? No  Functional Questionnaire: (I = Independent and D = Dependent) ADLs: I  Bathing/Dressing- I  Meal Prep- I  Eating- I  Maintaining continence- I  Transferring/Ambulation- I  Managing Meds- I  Follow up appointments reviewed:  PCP Hospital f/u appt confirmed? No   Specialist Hospital f/u appt confirmed? No   Are transportation arrangements needed? No  If their condition worsens, is the pt aware to call PCP or go to the Emergency Dept.? Yes Was the patient provided with contact information for the PCP's office or ED? Yes Was to pt encouraged to call back with questions or concerns? Yes

## 2022-03-17 ENCOUNTER — Inpatient Hospital Stay: Payer: 59 | Attending: Oncology

## 2022-03-17 DIAGNOSIS — D509 Iron deficiency anemia, unspecified: Secondary | ICD-10-CM | POA: Diagnosis present

## 2022-03-17 DIAGNOSIS — E559 Vitamin D deficiency, unspecified: Secondary | ICD-10-CM

## 2022-03-17 DIAGNOSIS — N920 Excessive and frequent menstruation with regular cycle: Secondary | ICD-10-CM | POA: Diagnosis present

## 2022-03-17 LAB — CBC WITH DIFFERENTIAL/PLATELET
Abs Immature Granulocytes: 0.01 10*3/uL (ref 0.00–0.07)
Basophils Absolute: 0 10*3/uL (ref 0.0–0.1)
Basophils Relative: 1 %
Eosinophils Absolute: 0.1 10*3/uL (ref 0.0–0.5)
Eosinophils Relative: 3 %
HCT: 37 % (ref 36.0–46.0)
Hemoglobin: 12.2 g/dL (ref 12.0–15.0)
Immature Granulocytes: 0 %
Lymphocytes Relative: 42 %
Lymphs Abs: 2.3 10*3/uL (ref 0.7–4.0)
MCH: 28.3 pg (ref 26.0–34.0)
MCHC: 33 g/dL (ref 30.0–36.0)
MCV: 85.8 fL (ref 80.0–100.0)
Monocytes Absolute: 0.4 10*3/uL (ref 0.1–1.0)
Monocytes Relative: 8 %
Neutro Abs: 2.5 10*3/uL (ref 1.7–7.7)
Neutrophils Relative %: 46 %
Platelets: 447 10*3/uL — ABNORMAL HIGH (ref 150–400)
RBC: 4.31 MIL/uL (ref 3.87–5.11)
RDW: 17.8 % — ABNORMAL HIGH (ref 11.5–15.5)
WBC: 5.4 10*3/uL (ref 4.0–10.5)
nRBC: 0 % (ref 0.0–0.2)

## 2022-03-17 LAB — IRON AND TIBC
Iron: 96 ug/dL (ref 28–170)
Saturation Ratios: 28 % (ref 10.4–31.8)
TIBC: 344 ug/dL (ref 250–450)
UIBC: 248 ug/dL

## 2022-03-17 LAB — FERRITIN: Ferritin: 47 ng/mL (ref 11–307)

## 2022-03-19 ENCOUNTER — Inpatient Hospital Stay: Payer: 59 | Admitting: Oncology

## 2022-03-19 LAB — HGB FRACTIONATION CASCADE
Hgb A2: 2.9 % (ref 1.8–3.2)
Hgb A: 62.7 % — ABNORMAL LOW (ref 96.4–98.8)
Hgb F: 0 % (ref 0.0–2.0)
Hgb S: 34.4 % — ABNORMAL HIGH

## 2022-03-19 LAB — HGB SOLUBILITY: Hgb Solubility: POSITIVE — AB

## 2022-03-20 ENCOUNTER — Inpatient Hospital Stay: Payer: 59 | Admitting: Oncology

## 2022-03-25 ENCOUNTER — Encounter: Payer: Self-pay | Admitting: Oncology

## 2022-03-25 ENCOUNTER — Inpatient Hospital Stay (HOSPITAL_BASED_OUTPATIENT_CLINIC_OR_DEPARTMENT_OTHER): Payer: 59 | Admitting: Oncology

## 2022-03-25 DIAGNOSIS — D573 Sickle-cell trait: Secondary | ICD-10-CM | POA: Diagnosis not present

## 2022-03-25 DIAGNOSIS — D508 Other iron deficiency anemias: Secondary | ICD-10-CM | POA: Diagnosis not present

## 2022-03-25 NOTE — Progress Notes (Signed)
I connected with Kathleen [name] on 03/25/22 at  3:15 PM EST by video enabled telemedicine visit and verified that I am speaking with the correct person using two identifiers.   I discussed the limitations, risks, security and privacy concerns of performing an evaluation and management service by telemedicine and the availability of in-person appointments. I also discussed with the patient that there may be a patient responsible charge related to this service. The patient expressed understanding and agreed to proceed.  Other persons participating in the visit and their role in the encounter:  none  Patient's location:  car Provider's location:  work  Risk analyst Complaint: Routine follow-up of iron deficiency anemia  History of present illness: patient is a 41 year old African-American female referred for iron deficiency anemiaLabs from 12/30/2021 showed white count of 6.8, H&H of 10.7/33.7 with an MCV of 81 and a platelet count of 286.  Ferritin levels were low at 10 with an iron saturation of 8%.  TSH B12 was normal.  She also had a sickle cell screen done which was positive she denies any family history of any blood disorders.  Does report that her menstrual cycles have been heavy especially in the first couple of days.  Denies any blood loss in her stool or urine.  Denies any dark melanotic stools.  Denies any consistent use of NSAIDs.   Hemoglobin electrophoresis was consistent with sickle cell trait.  Interval history menstrual cycles continue to be heavy.  She will be seeing GYN next month.   Review of Systems  Constitutional:  Negative for chills, fever, malaise/fatigue and weight loss.  HENT:  Negative for congestion, ear discharge and nosebleeds.   Eyes:  Negative for blurred vision.  Respiratory:  Negative for cough, hemoptysis, sputum production, shortness of breath and wheezing.   Cardiovascular:  Negative for chest pain, palpitations, orthopnea and claudication.  Gastrointestinal:   Negative for abdominal pain, blood in stool, constipation, diarrhea, heartburn, melena, nausea and vomiting.  Genitourinary:  Negative for dysuria, flank pain, frequency, hematuria and urgency.  Musculoskeletal:  Negative for back pain, joint pain and myalgias.  Skin:  Negative for rash.  Neurological:  Negative for dizziness, tingling, focal weakness, seizures, weakness and headaches.  Endo/Heme/Allergies:  Does not bruise/bleed easily.  Psychiatric/Behavioral:  Negative for depression and suicidal ideas. The patient does not have insomnia.     No Known Allergies  Past Medical History:  Diagnosis Date   Allergy     Past Surgical History:  Procedure Laterality Date   STENT PLACEMENT VASCULAR ([facility] HX)     TUBAL LIGATION      Social History   Socioeconomic History   Marital status: Single    Spouse name: Not on file   Number of children: Not on file   Years of education: Not on file   Highest education level: Not on file  Occupational History   Not on file  Tobacco Use   Smoking status: Never   Smokeless tobacco: Never  Vaping Use   Vaping Use: Never used  Substance and Sexual Activity   Alcohol use: Yes    Alcohol/week: 1.0 standard drink of alcohol    Types: 1 Glasses of wine per week   Drug use: Not Currently   Sexual activity: Yes  Other Topics Concern   Not on file  Social History Narrative   Not on file   Social Determinants of Health   Financial Resource Strain: Low Risk  (08/16/2020)   Overall Financial Resource Strain (CARDIA)  Difficulty of Paying Living Expenses: Not hard at all  Food Insecurity: No Food Insecurity (08/16/2020)   Hunger Vital Sign    Worried About Running Out of Food in the Last Year: Never true    Ran Out of Food in the Last Year: Never true  Transportation Needs: No Transportation Needs (08/16/2020)   PRAPARE - Hydrologist (Medical): No    Lack of Transportation (Non-Medical): No  Physical Activity:  Sufficiently Active (08/16/2020)   Exercise Vital Sign    Days of Exercise per Week: 3 days    Minutes of Exercise per Session: 50 min  Stress: No Stress Concern Present (08/16/2020)   [place]    Feeling of Stress : Not at all  Social Connections: Socially Isolated (08/16/2020)   Social Connection and Isolation Panel [NHANES]    Frequency of Communication with Friends and Family: More than three times a week    Frequency of Social Gatherings with Friends and Family: More than three times a week    Attends Religious Services: Never    Marine scientist or Organizations: No    Attends Music therapist: Never    Marital Status: Never married  Human resources officer Violence: Not on file    Family History  Problem Relation Age of Onset   Congestive Heart Failure Mother    Pulmonary Hypertension Mother    COPD Mother    Sickle cell anemia Mother    Irritable bowel syndrome Sister      Current Outpatient Medications:    Cholecalciferol (VITAMIN D3) 1.25 MG (50000 UT) CAPS, Take 1 capsule by mouth once a week., Disp: , Rfl:    Multiple Vitamin (MULTIVITAMIN) tablet, Take 1 tablet by mouth daily., Disp: , Rfl:    vitamin B-12 (CYANOCOBALAMIN) 250 MCG tablet, Take 250 mcg by mouth daily., Disp: , Rfl:    calcium-vitamin D (OSCAL WITH D) 500-5 MG-MCG tablet, Take 1 tablet by mouth. (Patient not taking: Reported on 01/08/2022), Disp: , Rfl:    EPINEPHrine 0.3 mg/0.3 mL IJ SOAJ injection, Inject 0.3 mg into the muscle as needed.  (Patient not taking: Reported on 01/08/2022), Disp: , Rfl:   No results found.  No images are attached to the encounter.      Latest Ref Rng & Units 10/15/2021    9:47 AM  CMP  Glucose 70 - 99 mg/dL 92   BUN 6 - 20 mg/dL 9   Creatinine 0.57 - 1.00 mg/dL 0.75   Sodium 134 - 144 mmol/L 140   Potassium 3.5 - 5.2 mmol/L 4.5   Chloride 96 - 106 mmol/L 104   CO2 20 - 29 mmol/L 21    Calcium 8.7 - 10.2 mg/dL 9.1   Total Protein 6.0 - 8.5 g/dL 7.0   Total Bilirubin 0.0 - 1.2 mg/dL 0.5   Alkaline Phos 44 - 121 IU/L 63   AST 0 - 40 IU/L 12   ALT 0 - 32 IU/L 10       Latest Ref Rng & Units 03/17/2022    9:22 AM  CBC  WBC 4.0 - 10.5 K/uL 5.4   Hemoglobin 12.0 - 15.0 g/dL 12.2   Hematocrit 36.0 - 46.0 % 37.0   Platelets 150 - 400 K/uL 447      Observation/objective: He is in no acute distress over video visit today.  Breathing is nonlabored  Assessment and plan: Patient is a 41 year old female  who is here for follow-up of following issues  Iron deficiency anemia: After receiving IV iron patient's hemoglobin is improved from 10.5-12.2.Iron studies are within normal limits and she does not require any IV iron at this time.  I will repeat CBC ferritin and iron studies in 3 and 6 months and I will see her back in 6 months  Patient found to have sickle cell trait on her hemoglobin electrophoresis.  Hemoglobin A was low at 62% and hemoglobin S elevated at 34%.  Patient does not have any significant anemia related to her sickle cell trait.  I have counseled her to keep herself well-hydrated especially during.'s of exercise.  Also patients with sickle cell trait have a slightly increased risk of medullary carcinoma of the kidney.  If she were to have any symptoms of hematuria she needs to bring that to medical attention immediately.  Patient verbalized understanding  Follow-up instructions: As above  I discussed the assessment and treatment plan with the patient. The patient was provided an opportunity to ask questions and all were answered. The patient agreed with the plan and demonstrated an understanding of the instructions.   The patient was advised to call back or seek an in-person evaluation if the symptoms worsen or if the condition fails to improve as anticipated.  I provided 14 minutes of face-to-face video visit time during this encounter, and > 50% was spent  counseling as documented under my assessment & plan.  Visit Diagnosis: 1. Other iron deficiency anemia   2. Sickle cell trait ([place])     Dr. Randa Evens, MD, MPH [facility] Tel- [phone] 03/25/2022 4:48 PM

## 2022-03-25 NOTE — Progress Notes (Signed)
Patient verified using two identifiers for virtual visit via telephone today.  Patient denies new problems/concerns today.   

## 2022-05-31 DIAGNOSIS — D573 Sickle-cell trait: Secondary | ICD-10-CM | POA: Insufficient documentation

## 2022-05-31 NOTE — Patient Instructions (Signed)

## 2022-06-03 ENCOUNTER — Encounter: Payer: Self-pay | Admitting: Nurse Practitioner

## 2022-06-03 ENCOUNTER — Ambulatory Visit: Payer: 59 | Admitting: Nurse Practitioner

## 2022-06-03 VITALS — BP 122/86 | HR 88 | Temp 97.8°F | Ht 67.01 in | Wt 214.5 lb

## 2022-06-03 DIAGNOSIS — Z113 Encounter for screening for infections with a predominantly sexual mode of transmission: Secondary | ICD-10-CM

## 2022-06-03 DIAGNOSIS — Z832 Family history of diseases of the blood and blood-forming organs and certain disorders involving the immune mechanism: Secondary | ICD-10-CM

## 2022-06-03 DIAGNOSIS — D508 Other iron deficiency anemias: Secondary | ICD-10-CM

## 2022-06-03 DIAGNOSIS — I10 Essential (primary) hypertension: Secondary | ICD-10-CM

## 2022-06-03 DIAGNOSIS — E669 Obesity, unspecified: Secondary | ICD-10-CM

## 2022-06-03 DIAGNOSIS — I440 Atrioventricular block, first degree: Secondary | ICD-10-CM

## 2022-06-03 DIAGNOSIS — D573 Sickle-cell trait: Secondary | ICD-10-CM | POA: Diagnosis not present

## 2022-06-03 NOTE — Assessment & Plan Note (Signed)
BMI 33.59. Recommended eating smaller high protein, low fat meals more frequently and exercising 30 mins a day 5 times a week with a goal of 10-15lb weight loss in the next 3 months. Patient voiced their understanding and motivation to adhere to these recommendations.

## 2022-06-03 NOTE — Assessment & Plan Note (Signed)
Followed by hematology, continue this collaboration.  Discussed with patient today.

## 2022-06-03 NOTE — Assessment & Plan Note (Signed)
She is a sickle cell trait patient, continue collaboration with hematology.

## 2022-06-03 NOTE — Assessment & Plan Note (Signed)
Chronic, stable.  Followed by hematology.  Recent notes reviewed and labs.

## 2022-06-03 NOTE — Assessment & Plan Note (Signed)
Stable, no symptoms at this time.  Return to cardiology as needed.

## 2022-06-03 NOTE — Assessment & Plan Note (Signed)
Chronic, stable.  BP on recheck at goal. Recommend she monitor BP at least a few mornings a week at home and document.  DASH diet at home.  Will continue diet focus at this time. Labs today: BMP.  Return for physical in July.

## 2022-06-03 NOTE — Progress Notes (Signed)
BP 122/86 (BP Location: Left Arm, Patient Position: Sitting, Cuff Size: Normal)   Pulse 88   Temp 97.8 F (36.6 C) (Oral)   Ht 5' 7.01" (1.702 m)   Wt 214 lb 8 oz (97.3 kg)   SpO2 99%   BMI 33.59 kg/m    Subjective:    Patient ID: Kathleen [name], female    DOB: [birth year], 42 y.o.   MRN: [id number]  HPI: Kathleen [name] is a 42 y.o. female  Chief Complaint  Patient presents with   Hyperlipidemia   Hypertension   Anemia   HYPERTENSION / HYPERLIPIDEMIA Has been off blood pressure for >2 years.  Continues to exercise and going to gym, fell off a little during holidays had Covid 02/22/22.   Duration of hypertension: chronic BP monitoring frequency: not checking BP range:  Duration of hyperlipidemia: chronic Cholesterol supplements: none Recent stressors: no Recurrent headaches: no Visual changes: no Palpitations: no Dyspnea: no Chest pain: no Lower extremity edema: no Dizzy/lightheaded: no The 10-year ASCVD risk score (Arnett DK, et al., 2019) is: 0.2%   Values used to calculate the score:     Age: 72 years     Sex: Female     Is Non-Hispanic African American: Yes     Diabetic: No     Tobacco smoker: No     Systolic Blood Pressure: 123XX123 mmHg     Is BP treated: No     HDL Cholesterol: 63 mg/dL     Total Cholesterol: 131 mg/dL   ANEMIA Saw hematology last 03/25/22.  Had some iron infusions.  She does have a sickle cell trait.  Felt better after infusions Anemia status: stable Etiology of anemia: iron deficiency Duration of anemia treatment: months Compliance with treatment: good compliance Iron supplementation side effects: no Severity of anemia: moderate Fatigue: no Decreased exercise tolerance: no  Dyspnea on exertion: no Palpitations: no Bleeding: no Pica: no  STD SCREENING Sexual activity:  In a Monogamous Relationship Contraception:  tubal ligation Recent unprotected intercourse: yes History of sexually transmitted diseases: no Previous  sexually transmitted disease screening: yes Genital lesions: no Dysuria: no Swollen lymph nodes: no Fevers: no Rash: no     Relevant past medical, surgical, family and social history reviewed and updated as indicated. Interim medical history since our last visit reviewed. Allergies and medications reviewed and updated.  Review of Systems  Constitutional:  Negative for activity change, appetite change, diaphoresis, fatigue and fever.  Respiratory:  Negative for cough, chest tightness and shortness of breath.   Cardiovascular:  Negative for chest pain, palpitations and leg swelling.  Gastrointestinal: Negative.   Neurological:  Negative for dizziness, syncope, weakness, light-headedness, numbness and headaches.  Psychiatric/Behavioral: Negative.      Per HPI unless specifically indicated above     Objective:    BP 122/86 (BP Location: Left Arm, Patient Position: Sitting, Cuff Size: Normal)   Pulse 88   Temp 97.8 F (36.6 C) (Oral)   Ht 5' 7.01" (1.702 m)   Wt 214 lb 8 oz (97.3 kg)   SpO2 99%   BMI 33.59 kg/m   Wt Readings from Last 3 Encounters:  06/03/22 214 lb 8 oz (97.3 kg)  02/22/22 200 lb (90.7 kg)  01/08/22 200 lb 11.2 oz (91 kg)    Physical Exam Vitals and nursing note reviewed.  Constitutional:      General: She is awake. She is not in acute distress.    Appearance: She is well-developed, well-groomed and overweight.  She is not ill-appearing.  HENT:     Head: Normocephalic.     Right Ear: Hearing normal.     Left Ear: Hearing normal.  Eyes:     General: Lids are normal.        Right eye: No discharge.        Left eye: No discharge.     Conjunctiva/sclera: Conjunctivae normal.     Pupils: Pupils are equal, round, and reactive to light.  Neck:     Thyroid: No thyromegaly.     Vascular: No carotid bruit.  Cardiovascular:     Rate and Rhythm: Normal rate and regular rhythm.     Heart sounds: Normal heart sounds. No murmur heard.    No gallop.  Pulmonary:      Effort: Pulmonary effort is normal. No accessory muscle usage or respiratory distress.     Breath sounds: Normal breath sounds.  Abdominal:     General: Bowel sounds are normal.     Palpations: Abdomen is soft.  Musculoskeletal:     Cervical back: Normal range of motion and neck supple.     Right lower leg: No edema.     Left lower leg: No edema.  Skin:    General: Skin is warm and dry.  Neurological:     Mental Status: She is alert and oriented to person, place, and time.  Psychiatric:        Attention and Perception: Attention normal.        Mood and Affect: Mood normal.        Speech: Speech normal.        Behavior: Behavior normal. Behavior is cooperative.        Thought Content: Thought content normal.    Results for orders placed or performed in visit on 03/17/22  Hgb Fractionation Cascade  Result Value Ref Range   Hgb F 0.0 0.0 - 2.0 %   Hgb A 62.7 (L) 96.4 - 98.8 %   Hgb A2 2.9 1.8 - 3.2 %   Hgb S 34.4 (H) 0.0 %   Interpretation, Hgb Fract Comment   Iron and TIBC  Result Value Ref Range   Iron 96 28 - 170 ug/dL   TIBC 344 250 - 450 ug/dL   Saturation Ratios 28 10.4 - 31.8 %   UIBC 248 ug/dL  Ferritin  Result Value Ref Range   Ferritin 47 11 - 307 ng/mL  CBC with Differential/Platelet  Result Value Ref Range   WBC 5.4 4.0 - 10.5 K/uL   RBC 4.31 3.87 - 5.11 MIL/uL   Hemoglobin 12.2 12.0 - 15.0 g/dL   HCT 37.0 36.0 - 46.0 %   MCV 85.8 80.0 - 100.0 fL   MCH 28.3 26.0 - 34.0 pg   MCHC 33.0 30.0 - 36.0 g/dL   RDW 17.8 (H) 11.5 - 15.5 %   Platelets 447 (H) 150 - 400 K/uL   nRBC 0.0 0.0 - 0.2 %   Neutrophils Relative % 46 %   Neutro Abs 2.5 1.7 - 7.7 K/uL   Lymphocytes Relative 42 %   Lymphs Abs 2.3 0.7 - 4.0 K/uL   Monocytes Relative 8 %   Monocytes Absolute 0.4 0.1 - 1.0 K/uL   Eosinophils Relative 3 %   Eosinophils Absolute 0.1 0.0 - 0.5 K/uL   Basophils Relative 1 %   Basophils Absolute 0.0 0.0 - 0.1 K/uL   Immature Granulocytes 0 %   Abs Immature  Granulocytes 0.01 0.00 - 0.07  K/uL  Hgb Solubility  Result Value Ref Range   Hgb Solubility Positive (A) Negative   Final Interpretation: Comment       Assessment & Plan:   Problem List Items Addressed This Visit       Cardiovascular and Mediastinum   Essential hypertension    Chronic, stable.  BP on recheck at goal. Recommend she monitor BP at least a few mornings a week at home and document.  DASH diet at home.  Will continue diet focus at this time. Labs today: BMP.  Return for physical in July.       Relevant Orders   Basic metabolic panel   First degree AV block    Stable, no symptoms at this time.  Return to cardiology as needed.        Other   Family history of sickle cell anemia    She is a sickle cell trait patient, continue collaboration with hematology.      Iron deficiency anemia    Chronic, stable.  Followed by hematology.  Recent notes reviewed and labs.      Relevant Orders   CBC with Differential/Platelet   Obesity (BMI 30.0-34.9)    BMI 33.59. Recommended eating smaller high protein, low fat meals more frequently and exercising 30 mins a day 5 times a week with a goal of 10-15lb weight loss in the next 3 months. Patient voiced their understanding and motivation to adhere to these recommendations.       Sickle cell trait ([place]) - Primary    Followed by hematology, continue this collaboration.  Discussed with patient today.      Relevant Orders   CBC with Differential/Platelet   Other Visit Diagnoses     Screen for STD (sexually transmitted disease)       STD screening on lab today per request.   Relevant Orders   GC/Chlamydia Probe Amp   HIV Antibody (routine testing w rflx)   RPR   HSV 1 and 2 Ab, IgG        Follow up plan: Return for as scheduled July 12th for physical.

## 2022-06-04 LAB — CBC WITH DIFFERENTIAL/PLATELET
Basophils Absolute: 0 10*3/uL (ref 0.0–0.2)
Basos: 1 %
EOS (ABSOLUTE): 0.1 10*3/uL (ref 0.0–0.4)
Eos: 3 %
Hematocrit: 42.5 % (ref 34.0–46.6)
Hemoglobin: 13.4 g/dL (ref 11.1–15.9)
Immature Grans (Abs): 0 10*3/uL (ref 0.0–0.1)
Immature Granulocytes: 0 %
Lymphocytes Absolute: 1.6 10*3/uL (ref 0.7–3.1)
Lymphs: 28 %
MCH: 28.8 pg (ref 26.6–33.0)
MCHC: 31.5 g/dL (ref 31.5–35.7)
MCV: 91 fL (ref 79–97)
Monocytes Absolute: 0.3 10*3/uL (ref 0.1–0.9)
Monocytes: 5 %
Neutrophils Absolute: 3.6 10*3/uL (ref 1.4–7.0)
Neutrophils: 63 %
Platelets: 203 10*3/uL (ref 150–450)
RBC: 4.66 x10E6/uL (ref 3.77–5.28)
RDW: 13.2 % (ref 11.7–15.4)
WBC: 5.6 10*3/uL (ref 3.4–10.8)

## 2022-06-04 LAB — HIV ANTIBODY (ROUTINE TESTING W REFLEX): HIV Screen 4th Generation wRfx: NONREACTIVE

## 2022-06-04 LAB — BASIC METABOLIC PANEL
BUN/Creatinine Ratio: 12 (ref 9–23)
BUN: 10 mg/dL (ref 6–24)
CO2: 23 mmol/L (ref 20–29)
Calcium: 9.1 mg/dL (ref 8.7–10.2)
Chloride: 103 mmol/L (ref 96–106)
Creatinine, Ser: 0.84 mg/dL (ref 0.57–1.00)
Glucose: 91 mg/dL (ref 70–99)
Potassium: 4.4 mmol/L (ref 3.5–5.2)
Sodium: 140 mmol/L (ref 134–144)
eGFR: 90 mL/min/{1.73_m2} (ref >59)

## 2022-06-04 LAB — HSV 1 AND 2 AB, IGG
HSV 1 Glycoprotein G Ab, IgG: 19.6 index — ABNORMAL HIGH (ref 0.00–0.90)
HSV 2 IgG, Type Spec: 21.3 index — ABNORMAL HIGH (ref 0.00–0.90)

## 2022-06-04 LAB — RPR: RPR Ser Ql: NONREACTIVE

## 2022-06-04 NOTE — Progress Notes (Signed)
Contacted via MyChart   Good afternoon Kathleen [name], some of your labs have returned, waiting on urine testing. - Kidney function, creatinine and eGFR, remains normal. 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  We will continue to monitor for flares and treat as needed.  Any questions? Keep being amazing!!  Thank you for allowing me to participate in your care.  I appreciate you. Kindest regards, Kadrian Partch

## 2022-06-06 LAB — GC/CHLAMYDIA PROBE AMP
Chlamydia trachomatis, NAA: NEGATIVE
Neisseria Gonorrhoeae by PCR: NEGATIVE

## 2022-06-06 NOTE — Progress Notes (Signed)
Contacted via MyChart   Urine testing is all negative:)

## 2022-06-24 ENCOUNTER — Inpatient Hospital Stay: Payer: 59 | Attending: Oncology

## 2022-09-12 ENCOUNTER — Encounter: Payer: Self-pay | Admitting: Emergency Medicine

## 2022-09-12 ENCOUNTER — Ambulatory Visit
Admission: EM | Admit: 2022-09-12 | Discharge: 2022-09-12 | Disposition: A | Discharge status: Home / Self Care | Payer: 59

## 2022-09-12 DIAGNOSIS — J069 Acute upper respiratory infection, unspecified: Secondary | ICD-10-CM

## 2022-09-12 NOTE — ED Provider Notes (Signed)
[facility]    CSN: [id number] Arrival date & time: 09/12/22  0907      History   Chief Complaint Chief Complaint  Patient presents with   Cough    HPI Kathleen [name] is a 42 y.o. female presenting for 4-day history of cough and nasal congestion.  Denies fever or fatigue.  No sore throat or breathing difficulty.  States her symptoms have improved.  States she started to notice greenish discoloration of her sputum and nasal drainage when it was previously clear.  She says she does not really feel that badly but thought she should get checked out.  She reports she has been taking multiple over-the-counter decongestants and using Flonase.  No sick contacts.  No other complaints.  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      Home Medications    Prior to Admission medications   Medication Sig Start Date End Date Taking? Authorizing Provider  Multiple Vitamin (MULTIVITAMIN) tablet Take 1 tablet by mouth daily.   Yes [provider]  zinc gluconate 50 MG tablet Take 50 mg by mouth daily.   Yes [provider]  calcium-vitamin D (OSCAL WITH D) 500-5 MG-MCG tablet Take 1 tablet by mouth. Patient not taking: Reported on 01/08/2022    [provider]   Cholecalciferol (VITAMIN D3) 1.25 MG (50000 UT) CAPS Take 1 capsule by mouth once a week. Patient not taking: Reported on 06/03/2022 11/08/21   [provider]  EPINEPHrine 0.3 mg/0.3 mL IJ SOAJ injection Inject 0.3 mg into the muscle as needed.  Patient not taking: Reported on 01/08/2022 08/04/18   [provider]  vitamin B-12 (CYANOCOBALAMIN) 250 MCG tablet Take 250 mcg by mouth daily.    [provider]    Family History Family History  Problem Relation Age of Onset   Congestive Heart Failure Mother    Pulmonary Hypertension Mother    COPD Mother    Sickle cell anemia Mother    Irritable bowel syndrome Sister     Social History Social History   Tobacco Use   Smoking status: Never   Smokeless tobacco: Never  Vaping Use   Vaping Use: Never used  Substance Use Topics   Alcohol use: Yes    Alcohol/week: 1.0 standard drink of alcohol    Types: 1 Glasses of wine per week   Drug use: Not Currently     Allergies   Patient has no known allergies.   Review of Systems Review of Systems  Constitutional:  Negative for chills, diaphoresis, fatigue and fever.  HENT:  Positive for congestion and rhinorrhea. Negative for ear pain, sinus pressure, sinus pain and sore throat.   Respiratory:  Positive for cough.  Negative for shortness of breath.   Cardiovascular:  Negative for chest pain.  Gastrointestinal:  Negative for abdominal pain, nausea and vomiting.  Musculoskeletal:  Negative for arthralgias and myalgias.  Skin:  Negative for rash.  Neurological:  Negative for weakness and headaches.  Hematological:  Negative for adenopathy.     Physical Exam Triage Vital Signs ED Triage Vitals  Enc Vitals Group     BP 09/12/22 0922 (!) 153/105     Pulse Rate 09/12/22 0922 85     Resp 09/12/22 0922 14     Temp 09/12/22 0922 98.5 F (36.9 C)     Temp Source 09/12/22 0922 Oral     SpO2 09/12/22 0922 98 %     Weight 09/12/22 0919 195 lb (88.5 kg)     Height  09/12/22 0919 5\' 7"  (1.702 m)     Head Circumference --      Peak Flow --      Pain Score 09/12/22 0919 0     Pain Loc --      Pain Edu? --      Excl. in GC? --    No data found.  Updated Vital Signs BP (!) 153/105 (BP Location: Left Arm)   Pulse 85   Temp 98.5 F (36.9 C) (Oral)   Resp 14   Ht 5\' 7"  (1.702 m)   Wt 195 lb (88.5 kg)   LMP 09/10/2022 (Exact Date)   SpO2 98%   BMI 30.54 kg/m       Physical Exam Vitals and nursing note reviewed.  Constitutional:      General: She is not in acute distress.    Appearance: Normal appearance. She is not ill-appearing or toxic-appearing.  HENT:     Head: Normocephalic and atraumatic.     Right Ear: Tympanic membrane, ear canal and external ear normal.     Left Ear: Tympanic membrane, ear canal and external ear normal.     Nose: Congestion present.     Comments: Clear PND    Mouth/Throat:     Mouth: Mucous membranes are moist.     Pharynx: Oropharynx is clear.  Eyes:     General: No scleral icterus.       Right eye: No discharge.        Left eye: No discharge.     Conjunctiva/sclera: Conjunctivae normal.  Cardiovascular:     Rate and Rhythm: Normal rate and regular rhythm.     Heart sounds: Normal heart sounds.  Pulmonary:     Effort: Pulmonary effort is normal. No respiratory distress.     Breath sounds: Normal breath sounds.  Musculoskeletal:     Cervical back: Neck supple.  Skin:    General: Skin is dry.  Neurological:     General: No focal deficit present.     Mental Status: She is alert. Mental status is at baseline.     Motor: No weakness.     Gait: Gait normal.  Psychiatric:        Mood and Affect: Mood normal.        Behavior: Behavior normal.        Thought Content: Thought content normal.      UC Treatments / Results  Labs (all labs ordered are listed, but only abnormal results are displayed) Labs Reviewed - No data to display  EKG   Radiology No results found.  Procedures Procedures  (including critical care time)  Medications Ordered in UC Medications - No data  to display  Initial Impression / Assessment and Plan / UC Course  I have reviewed the triage vital signs and the nursing notes.  Pertinent labs & imaging results that were available during my care of the patient were reviewed by me and considered in my medical decision making (see chart for details).   42 year old female presents for cough and congestion x 4 days.  Denies fever, fatigue, sore throat or breathing difficulty.  Reports symptoms have improved from onset.  Advised patient her presentation consistent with viral URI.  Supportive care encouraged.  Advised to continue with decongestants and Flonase.  Advised to return if no improvement after 10 days or if she develops a fever, worsening cough or shortness of breath.  Patient is understanding and agreeable.  Patient declines a work note or in AVS.   Final Clinical Impressions(s) / UC Diagnoses   Final diagnoses:  Viral URI with cough     Discharge Instructions      URI/COLD SYMPTOMS: Your exam today is consistent with a viral illness. Antibiotics are not indicated at this time. Use medications as directed, including cough syrup, nasal saline, and decongestants. Your symptoms should improve over the next few days and resolve within 7-10 days. Increase rest and fluids. F/u if symptoms worsen or predominate such as sore throat, ear pain, productive cough, shortness of breath, or if you develop high fevers or worsening fatigue over the next several days.       ED Prescriptions   None    PDMP not reviewed this encounter.   Shirlee Latch, PA-C 09/12/22 [phone]

## 2022-09-12 NOTE — Discharge Instructions (Addendum)
URI/COLD SYMPTOMS: Your exam today is consistent with a viral illness. Antibiotics are not indicated at this time. Use medications as directed, including cough syrup, nasal saline, and decongestants. Your symptoms should improve over the next few days and resolve within 7-10 days. Increase rest and fluids. F/u if symptoms worsen or predominate such as sore throat, ear pain, productive cough, shortness of breath, or if you develop high fevers or worsening fatigue over the next several days.    

## 2022-09-12 NOTE — ED Triage Notes (Signed)
Patient c/o cough and chest congestion that started on Monday.  Patient reports nasal drainage and coughing up green sputum.  Patient denies fevers.

## 2022-09-26 ENCOUNTER — Inpatient Hospital Stay: Payer: 59

## 2022-09-26 ENCOUNTER — Inpatient Hospital Stay: Payer: 59 | Admitting: Nurse Practitioner

## 2022-09-30 ENCOUNTER — Encounter: Payer: Self-pay | Admitting: Nurse Practitioner

## 2022-09-30 ENCOUNTER — Inpatient Hospital Stay: Payer: 59 | Attending: Nurse Practitioner

## 2022-09-30 ENCOUNTER — Inpatient Hospital Stay (HOSPITAL_BASED_OUTPATIENT_CLINIC_OR_DEPARTMENT_OTHER): Payer: 59 | Admitting: Nurse Practitioner

## 2022-09-30 VITALS — BP 131/105 | HR 74 | Temp 97.7°F | Resp 16 | Wt 218.9 lb

## 2022-09-30 DIAGNOSIS — D508 Other iron deficiency anemias: Secondary | ICD-10-CM

## 2022-09-30 DIAGNOSIS — D509 Iron deficiency anemia, unspecified: Secondary | ICD-10-CM | POA: Diagnosis present

## 2022-09-30 DIAGNOSIS — N920 Excessive and frequent menstruation with regular cycle: Secondary | ICD-10-CM | POA: Diagnosis not present

## 2022-09-30 LAB — CBC WITH DIFFERENTIAL/PLATELET
Abs Immature Granulocytes: 0.03 10*3/uL (ref 0.00–0.07)
Basophils Absolute: 0 10*3/uL (ref 0.0–0.1)
Basophils Relative: 0 %
Eosinophils Absolute: 0.3 10*3/uL (ref 0.0–0.5)
Eosinophils Relative: 4 %
HCT: 33 % — ABNORMAL LOW (ref 36.0–46.0)
Hemoglobin: 10.9 g/dL — ABNORMAL LOW (ref 12.0–15.0)
Immature Granulocytes: 0 %
Lymphocytes Relative: 30 %
Lymphs Abs: 2.5 10*3/uL (ref 0.7–4.0)
MCH: 28.4 pg (ref 26.0–34.0)
MCHC: 33 g/dL (ref 30.0–36.0)
MCV: 85.9 fL (ref 80.0–100.0)
Monocytes Absolute: 0.7 10*3/uL (ref 0.1–1.0)
Monocytes Relative: 8 %
Neutro Abs: 4.7 10*3/uL (ref 1.7–7.7)
Neutrophils Relative %: 58 %
Platelets: 299 10*3/uL (ref 150–400)
RBC: 3.84 MIL/uL — ABNORMAL LOW (ref 3.87–5.11)
RDW: 14.2 % (ref 11.5–15.5)
WBC: 8.2 10*3/uL (ref 4.0–10.5)
nRBC: 0 % (ref 0.0–0.2)

## 2022-09-30 LAB — IRON AND TIBC
Iron: 60 ug/dL (ref 28–170)
Saturation Ratios: 15 % (ref 10.4–31.8)
TIBC: 407 ug/dL (ref 250–450)
UIBC: 347 ug/dL

## 2022-09-30 LAB — FERRITIN: Ferritin: 7 ng/mL — ABNORMAL LOW (ref 11–307)

## 2022-09-30 NOTE — Progress Notes (Signed)
Hematology/Oncology Consult Note [facility] [phone] [phone]  Patient Care Team: Marjie Skiff, NP as PCP - General (Nurse Practitioner) Debbe Odea, MD as PCP - Cardiology (Cardiology)   Name of the patient: Kathleen [name]  [id number]  [birth year]   Reason for referral- iron deficiency anemia   Referring physician- Jimmie Molly, NP  Date of visit: 09/30/22  History of presenting illness-patient is a 42 year old African-American female referred for iron deficiency anemia. Labs from 12/30/2021 showed white count of 6.8, H&H of 10.7/33.7 with an MCV of 81 and a platelet count of 286.  Ferritin levels were low at 10 with an iron saturation of 8%.  TSH B12 was normal.  She also had a sickle cell screen done which was positive she denies any family history of any blood disorders.  Does report that her menstrual cycles have been heavy especially in the first couple of days.  Denies any blood loss in her stool or urine.  Denies any dark melanotic stools.  Denies any consistent use of NSAIDs.  Reports some ongoing fatigue.  She has not tried oral iron yet.  Hemoglobin electrophoresis was consistent with sickle cell trait. Hemoglobin A was low at 62% and hemoglobin S elevated at 34%.   Interval History: Patient is 42 year old female with iron deficiency who returns to clinic for repeat labs. Feels tired. Has gained weight. Continues to have heavy menstrual periods particularly for first couple of days. Has not seen gynecology. Requests referral and prefers female provider.   ECOG PS- 1  Pain scale- 0  Review of systems- Review of Systems  Constitutional:  Positive for malaise/fatigue. Negative for chills, fever and weight loss.  HENT:  Negative for congestion, ear discharge and nosebleeds.   Eyes:  Negative for blurred vision.  Respiratory:  Negative for cough, hemoptysis, sputum production, shortness of breath and wheezing.    Cardiovascular:  Negative for chest pain, palpitations, orthopnea and claudication.  Gastrointestinal:  Negative for abdominal pain, blood in stool, constipation, diarrhea, heartburn, melena, nausea and vomiting.  Genitourinary:  Negative for dysuria, flank pain, frequency, hematuria and urgency.  Musculoskeletal:  Negative for back pain, joint pain and myalgias.  Skin:  Negative for rash.  Neurological:  Negative for dizziness, tingling, focal weakness, seizures, weakness and headaches.  Endo/Heme/Allergies:  Does not bruise/bleed easily.  Psychiatric/Behavioral:  Negative for depression and suicidal ideas. The patient does not have insomnia.     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   Obesity (BMI 30.0-34.9) 02/22/2018   Essential hypertension 02/22/2018     Past Medical History:  Diagnosis Date   Allergy      Past Surgical History:  Procedure Laterality Date   STENT PLACEMENT VASCULAR ([facility] HX)     TUBAL LIGATION      Social History   Socioeconomic History   Marital status: Single    Spouse name: Not on file   Number of children: Not on file   Years of education: Not on file   Highest education level: Not on file  Occupational History   Not on file  Tobacco Use   Smoking status: Never   Smokeless tobacco: Never  Vaping Use   Vaping Use: Never used  Substance and Sexual Activity   Alcohol use: Yes    Alcohol/week: 1.0 standard drink of alcohol    Types: 1 Glasses of wine per week   Drug use: Not Currently   Sexual activity: Yes  Other  Topics Concern   Not on file  Social History Narrative   Not on file   Social Determinants of Health   Financial Resource Strain: Low Risk  (08/16/2020)   Overall Financial Resource Strain (CARDIA)    Difficulty of Paying Living Expenses: Not hard at all  Food Insecurity: No Food Insecurity (08/16/2020)   Hunger Vital Sign    Worried About Running Out of Food in the Last Year: Never true    Ran Out of Food in the Last Year: Never true  Transportation Needs: No Transportation Needs (08/16/2020)   PRAPARE - Administrator, Civil Service (Medical): No    Lack of Transportation (Non-Medical): No  Physical Activity: Sufficiently Active (08/16/2020)   Exercise Vital Sign    Days of Exercise per Week: 3 days    Minutes of Exercise per Session: 50 min  Stress: No Stress Concern Present (08/16/2020)   [employer] of Occupational Health - Occupational Stress Questionnaire    Feeling of Stress : Not at all  Social Connections: Socially Isolated (08/16/2020)   Social Connection and Isolation Panel [NHANES]    Frequency of Communication with Friends and Family: More than three times a week    Frequency of Social Gatherings with Friends and Family: More than three times a week    Attends Religious Services: Never    Database administrator or Organizations: No    Attends Engineer, structural: Never    Marital Status: Never married  Catering manager Violence: Not on file     Family History  Problem Relation Age of Onset   Congestive Heart Failure Mother    Pulmonary Hypertension Mother    COPD Mother    Sickle cell anemia Mother    Irritable bowel syndrome Sister      Current Outpatient Medications:    Multiple Vitamin (MULTIVITAMIN) tablet, Take 1 tablet by mouth daily., Disp: , Rfl:    vitamin B-12 (CYANOCOBALAMIN) 250 MCG tablet, Take 250 mcg by mouth daily., Disp: , Rfl:    zinc gluconate 50 MG tablet, Take 50 mg by mouth daily., Disp: , Rfl:     calcium-vitamin D (OSCAL WITH D) 500-5 MG-MCG tablet, Take 1 tablet by mouth. (Patient not taking: Reported on 01/08/2022), Disp: , Rfl:    Cholecalciferol (VITAMIN D3) 1.25 MG (50000 UT) CAPS, Take 1 capsule by mouth once a week. (Patient not taking: Reported on 06/03/2022), Disp: , Rfl:    EPINEPHrine 0.3 mg/0.3 mL IJ SOAJ injection, Inject 0.3 mg into the muscle as needed.  (Patient not taking: Reported on 01/08/2022), Disp: , Rfl:    Physical exam:  Vitals:   09/30/22 1057  BP: (!) 131/105  Pulse: 74  Resp: 16  Temp: 97.7 F (36.5 C)  TempSrc: Tympanic  SpO2: 99%  Weight: 218 lb 14.4 oz (99.3 kg)   Physical Exam Constitutional:      General: She is not in acute distress. Cardiovascular:     Rate and Rhythm: Normal rate and regular rhythm.     Heart sounds: Normal heart sounds.  Pulmonary:     Effort: Pulmonary effort is normal.     Breath sounds: Normal breath sounds.  Abdominal:     General: Bowel sounds are normal.     Palpations: Abdomen is soft.  Skin:    General: Skin is warm and dry.  Neurological:     Mental Status: She is alert and oriented to person, place, and time.         Latest Ref Rng & Units 06/03/2022    9:08 AM  CMP  Glucose 70 - 99 mg/dL 91   BUN 6 - 24 mg/dL 10   Creatinine 1.61 - 1.00 mg/dL 0.96   Sodium 045 - 409 mmol/L 140   Potassium 3.5 - 5.2 mmol/L 4.4   Chloride 96 - 106 mmol/L 103   CO2 20 - 29 mmol/L 23   Calcium 8.7 - 10.2 mg/dL 9.1       Latest Ref Rng & Units 09/30/2022   10:45 AM  CBC  WBC 4.0 - 10.5 K/uL 8.2   Hemoglobin 12.0 - 15.0 g/dL 81.1   Hematocrit 91.4 - 46.0 % 33.0   Platelets 150 - 400 K/uL 299    Iron/TIBC/Ferritin/ %Sat    Component Value Date/Time   IRON 96 03/17/2022 0922   IRON 35 12/30/2021 0908   TIBC 344 03/17/2022 0922   TIBC 436 12/30/2021 0908   FERRITIN 47 03/17/2022 0922   FERRITIN 10 (L) 12/30/2021 0908   IRONPCTSAT 28 03/17/2022 0922   IRONPCTSAT 8 (LL) 12/30/2021 0908     Assessment and  plan- Patient is a 42 y.o. female   Iron deficiency anemia- secondary to menorrhagia. S/p IV venofer. Last 01/31/22. Hemoglobin has dropped to 10.9. Iron studies pending at time of visit but I suspect she will need IV iron again. Symptomatic. Tolerates venofer well. Plan for venofer x 5 (weekly). Plan for pregnancy test prior to venofer or monthly.  Menorrhagia- referred to [place] [facility]. Prefers female providers.  Positive sickle cell screen. Hemoglobin electrophoresis not drawn today but will add for next visit  Disposition:  Venofer x 5 (weekly) Ref to [place] [facility]  3 mo- lab (cbc, ferritin, hemoglobin electrophoresis, iron studies), Dr Smith Robert, +/- venofer- la   Visit Diagnosis 1. Menorrhagia with regular cycle   2. Other iron deficiency anemia    Consuello Masse, DNP, AGNP-C, [facility] at [facility] [phone] (clinic) 09/30/2022

## 2022-10-06 ENCOUNTER — Inpatient Hospital Stay: Payer: 59 | Attending: Oncology

## 2022-10-06 VITALS — BP 136/95 | HR 84 | Temp 98.0°F | Resp 18

## 2022-10-06 DIAGNOSIS — D500 Iron deficiency anemia secondary to blood loss (chronic): Secondary | ICD-10-CM | POA: Diagnosis present

## 2022-10-06 DIAGNOSIS — N920 Excessive and frequent menstruation with regular cycle: Secondary | ICD-10-CM | POA: Insufficient documentation

## 2022-10-06 DIAGNOSIS — D508 Other iron deficiency anemias: Secondary | ICD-10-CM

## 2022-10-06 MED ORDER — SODIUM CHLORIDE 0.9 % IV SOLN
200.0000 mg | INTRAVENOUS | Status: DC
  Administered 2022-10-06: 200 mg via INTRAVENOUS
  Filled 2022-10-06: qty 200

## 2022-10-06 MED ORDER — SODIUM CHLORIDE 0.9 % IV SOLN
INTRAVENOUS | Status: DC
  Filled 2022-10-06: qty 250

## 2022-10-12 NOTE — Patient Instructions (Addendum)
Please call to schedule your mammogram and/or bone density: [facility]  Address: [address], [place], [place] [address] Phone: [phone]  [facility] [address] [place],  [place]  [address] Phone: [phone]    Be Involved in Caring For Your Health:  Taking Medications When medications are taken as directed, they can greatly improve your health. But if they are not taken as prescribed, they may not work. In some cases, not taking them correctly can be harmful. To help ensure your treatment remains effective and safe, understand your medications and how to take them. Bring your medications to each visit for review by your provider.  Your lab results, notes, and after visit summary will be available on My Chart. We strongly encourage you to use this feature. If lab results are abnormal the clinic will contact you with the appropriate steps. If the clinic does not contact you assume the results are satisfactory. You can always view your results on My Chart. If you have questions regarding your health or results, please contact the clinic during office hours. You can also ask questions on My Chart.  We at [facility] are grateful that you chose [place] to provide your care. We strive to provide evidence-based and compassionate care and are always looking for feedback. If you get a survey from the clinic please complete this so we can hear your opinions.  DASH Eating Plan DASH stands for Dietary Approaches to Stop Hypertension. The DASH eating plan is a healthy eating plan that has been shown to: Lower high blood pressure (hypertension). Reduce your risk for type 2 diabetes, heart disease, and stroke. Help with weight loss. What are tips for following this plan? Reading food labels Check food labels for the amount of salt (sodium) per serving. Choose foods with less than 5 percent of the Daily  Value (DV) of sodium. In general, foods with less than 300 milligrams (mg) of sodium per serving fit into this eating plan. To find whole grains, look for the word "whole" as the first word in the ingredient list. Shopping Buy products labeled as "low-sodium" or "no salt added." Buy fresh foods. Avoid canned foods and pre-made or frozen meals. Cooking Try not to add salt when you cook. Use salt-free seasonings or herbs instead of table salt or sea salt. Check with your health care provider or pharmacist before using salt substitutes. Do not fry foods. Cook foods in healthy ways, such as baking, boiling, grilling, roasting, or broiling. Cook using oils that are good for your heart. These include olive, canola, avocado, soybean, and sunflower oil. Meal planning  Eat a balanced diet. This should include: 4 or more servings of fruits and 4 or more servings of vegetables each day. Try to fill half of your plate with fruits and vegetables. 6-8 servings of whole grains each day. 6 or less servings of lean meat, poultry, or fish each day. 1 oz is 1 serving. A 3 oz (85 g) serving of meat is about the same size as the palm of your hand. One egg is 1 oz (28 g). 2-3 servings of low-fat dairy each day. One serving is 1 cup (237 mL). 1 serving of nuts, seeds, or beans 5 times each week. 2-3 servings of heart-healthy fats. Healthy fats called omega-3 fatty acids are found in foods such as walnuts, flaxseeds, fortified milks, and eggs. These fats are also found in cold-water fish,  such as sardines, salmon, and mackerel. Limit how much you eat of: Canned or prepackaged foods. Food that is high in trans fat, such as fried foods. Food that is high in saturated fat, such as fatty meat. Desserts and other sweets, sugary drinks, and other foods with added sugar. Full-fat dairy products. Do not salt foods before eating. Do not eat more than 4 egg yolks a week. Try to eat at least 2 vegetarian meals a week. Eat  more home-cooked food and less restaurant, buffet, and fast food. Lifestyle When eating at a restaurant, ask if your food can be made with less salt or no salt. If you drink alcohol: Limit how much you have to: 0-1 drink a day if you are female. 0-2 drinks a day if you are female. Know how much alcohol is in your drink. In the [place] one drink is one 12 oz bottle of beer (355 mL), one 5 oz glass of wine (148 mL), or one 1 oz glass of hard liquor (44 mL). General information Avoid eating more than 2,300 mg of salt a day. If you have hypertension, you may need to reduce your sodium intake to 1,500 mg a day. Work with your provider to stay at a healthy body weight or lose weight. Ask what the best weight range is for you. On most days of the week, get at least 30 minutes of exercise that causes your heart to beat faster. This may include walking, swimming, or biking. Work with your provider or dietitian to adjust your eating plan to meet your specific calorie needs. What foods should I eat? Fruits All fresh, dried, or frozen fruit. Canned fruits that are in their natural juice and do not have sugar added to them. Vegetables Fresh or frozen vegetables that are raw, steamed, roasted, or grilled. Low-sodium or reduced-sodium tomato and vegetable juice. Low-sodium or reduced-sodium tomato sauce and tomato paste. Low-sodium or reduced-sodium canned vegetables. Grains Whole-grain or whole-wheat bread. Whole-grain or whole-wheat pasta. Brown rice. Orpah Cobb. Bulgur. Whole-grain and low-sodium cereals. Pita bread. Low-fat, low-sodium crackers. Whole-wheat flour tortillas. Meats and other proteins Skinless chicken or [place]. Ground chicken or [place]. Pork with fat trimmed off. Fish and seafood. Egg whites. Dried beans, peas, or lentils. Unsalted nuts, nut butters, and seeds. Unsalted canned beans. Lean cuts of beef with fat trimmed off. Low-sodium, lean precooked or cured meat, such as sausages or meat  loaves. Dairy Low-fat (1%) or fat-free (skim) milk. Reduced-fat, low-fat, or fat-free cheeses. Nonfat, low-sodium ricotta or cottage cheese. Low-fat or nonfat yogurt. Low-fat, low-sodium cheese. Fats and oils Soft margarine without trans fats. Vegetable oil. Reduced-fat, low-fat, or light mayonnaise and salad dressings (reduced-sodium). Canola, safflower, olive, avocado, soybean, and sunflower oils. Avocado. Seasonings and condiments Herbs. Spices. Seasoning mixes without salt. Other foods Unsalted popcorn and pretzels. Fat-free sweets. The items listed above may not be all the foods and drinks you can have. Talk to a dietitian to learn more. What foods should I avoid? Fruits Canned fruit in a light or heavy syrup. Fried fruit. Fruit in cream or butter sauce. Vegetables Creamed or fried vegetables. Vegetables in a cheese sauce. Regular canned vegetables that are not marked as low-sodium or reduced-sodium. Regular canned tomato sauce and paste that are not marked as low-sodium or reduced-sodium. Regular tomato and vegetable juices that are not marked as low-sodium or reduced-sodium. Rosita Fire. Olives. Grains Baked goods made with fat, such as croissants, muffins, or some breads. Dry pasta or rice meal packs. Meats and  other proteins Fatty cuts of meat. Ribs. Fried meat. Tomasa Blase. Bologna, salami, and other precooked or cured meats, such as sausages or meat loaves, that are not lean and low in sodium. Fat from the back of a pig (fatback). Bratwurst. Salted nuts and seeds. Canned beans with added salt. Canned or smoked fish. Whole eggs or egg yolks. Chicken or [place] with skin. Dairy Whole or 2% milk, cream, and half-and-half. Whole or full-fat cream cheese. Whole-fat or sweetened yogurt. Full-fat cheese. Nondairy creamers. Whipped toppings. Processed cheese and cheese spreads. Fats and oils Butter. Stick margarine. Lard. Shortening. Ghee. Bacon fat. Tropical oils, such as coconut, palm kernel, or palm  oil. Seasonings and condiments Onion salt, garlic salt, seasoned salt, table salt, and sea salt. Worcestershire sauce. Tartar sauce. Barbecue sauce. Teriyaki sauce. Soy sauce, including reduced-sodium soy sauce. Steak sauce. Canned and packaged gravies. Fish sauce. Oyster sauce. Cocktail sauce. Store-bought horseradish. Ketchup. Mustard. Meat flavorings and tenderizers. Bouillon cubes. Hot sauces. Pre-made or packaged marinades. Pre-made or packaged taco seasonings. Relishes. Regular salad dressings. Other foods Salted popcorn and pretzels. The items listed above may not be all the foods and drinks you should avoid. Talk to a dietitian to learn more. Where to find more information National Heart, Lung, and Blood Institute (NHLBI): BuffaloDryCleaner.gl American Heart Association (AHA): heart.org Academy of Nutrition and Dietetics: eatright.org National Kidney Foundation (NKF): kidney.org This information is not intended to replace advice given to you by your health care provider. Make sure you discuss any questions you have with your health care provider. Document Revised: 04/10/2022 Document Reviewed: 04/10/2022 Elsevier Patient Education  2024 [employer].

## 2022-10-15 ENCOUNTER — Inpatient Hospital Stay: Payer: 59

## 2022-10-15 VITALS — BP 133/109 | HR 86 | Resp 16

## 2022-10-15 DIAGNOSIS — D500 Iron deficiency anemia secondary to blood loss (chronic): Secondary | ICD-10-CM | POA: Diagnosis not present

## 2022-10-15 DIAGNOSIS — D508 Other iron deficiency anemias: Secondary | ICD-10-CM

## 2022-10-15 MED ORDER — SODIUM CHLORIDE 0.9 % IV SOLN
Freq: Once | INTRAVENOUS | Status: AC
  Filled 2022-10-15: qty 250

## 2022-10-15 MED ORDER — SODIUM CHLORIDE 0.9 % IV SOLN
200.0000 mg | INTRAVENOUS | Status: DC
  Administered 2022-10-15: 200 mg via INTRAVENOUS
  Filled 2022-10-15: qty 200

## 2022-10-17 ENCOUNTER — Ambulatory Visit (INDEPENDENT_AMBULATORY_CARE_PROVIDER_SITE_OTHER): Payer: 59 | Admitting: Nurse Practitioner

## 2022-10-17 ENCOUNTER — Encounter: Payer: Self-pay | Admitting: Nurse Practitioner

## 2022-10-17 VITALS — BP 126/78 | HR 75 | Temp 98.1°F | Ht 67.01 in | Wt 217.8 lb

## 2022-10-17 DIAGNOSIS — D508 Other iron deficiency anemias: Secondary | ICD-10-CM

## 2022-10-17 DIAGNOSIS — Z113 Encounter for screening for infections with a predominantly sexual mode of transmission: Secondary | ICD-10-CM

## 2022-10-17 DIAGNOSIS — Z124 Encounter for screening for malignant neoplasm of cervix: Secondary | ICD-10-CM

## 2022-10-17 DIAGNOSIS — Z0000 Encounter for general adult medical examination without abnormal findings: Secondary | ICD-10-CM | POA: Diagnosis not present

## 2022-10-17 DIAGNOSIS — R7989 Other specified abnormal findings of blood chemistry: Secondary | ICD-10-CM

## 2022-10-17 DIAGNOSIS — Z1231 Encounter for screening mammogram for malignant neoplasm of breast: Secondary | ICD-10-CM | POA: Diagnosis not present

## 2022-10-17 DIAGNOSIS — D573 Sickle-cell trait: Secondary | ICD-10-CM | POA: Diagnosis not present

## 2022-10-17 DIAGNOSIS — E66811 Obesity, class 1: Secondary | ICD-10-CM

## 2022-10-17 DIAGNOSIS — I10 Essential (primary) hypertension: Secondary | ICD-10-CM | POA: Diagnosis not present

## 2022-10-17 DIAGNOSIS — I440 Atrioventricular block, first degree: Secondary | ICD-10-CM | POA: Diagnosis not present

## 2022-10-17 DIAGNOSIS — E669 Obesity, unspecified: Secondary | ICD-10-CM

## 2022-10-17 DIAGNOSIS — I871 Compression of vein: Secondary | ICD-10-CM

## 2022-10-17 DIAGNOSIS — E559 Vitamin D deficiency, unspecified: Secondary | ICD-10-CM

## 2022-10-17 NOTE — Assessment & Plan Note (Signed)
History of low levels, recheck today and continue supplements. 

## 2022-10-17 NOTE — Assessment & Plan Note (Signed)
Chronic, ongoing issue.  Recommend she wear compression hose daily at work, as notices this more on 12 hours shifts.  May obtain compression hose at [employer].  Return to vascular if ongoing issues or if pain presents. 

## 2022-10-17 NOTE — Assessment & Plan Note (Signed)
Followed by hematology, continue this collaboration.  Discussed with patient today. 

## 2022-10-17 NOTE — Assessment & Plan Note (Signed)
Chronic, stable.  Followed by hematology.  Recent notes reviewed and labs. 

## 2022-10-17 NOTE — Assessment & Plan Note (Signed)
Stable, no symptoms at this time.  Return to cardiology as needed. 

## 2022-10-17 NOTE — Progress Notes (Signed)
BP 126/78 (BP Location: Left Arm, Patient Position: Sitting, Cuff Size: Normal)   Pulse 75   Temp 98.1 F (36.7 C) (Oral)   Ht 5' 7.01" (1.702 m)   Wt 217 lb 12.8 oz (98.8 kg)   SpO2 96%   BMI 34.10 kg/m    Subjective:    Patient ID: Kathleen [name], female    DOB: 1980/06/27, 42 y.o.   MRN: [id number]  HPI: Kathleen [name] is a 42 y.o. female presenting on 10/17/2022 for comprehensive medical examination. Current medical complaints include:none  She currently lives with: kids Menopausal Symptoms: no   Would like STD testing today.  HYPERTENSION No medications at this time -- in past took Lisinopril.  Has been stable without medication  Benign osteochondroma of left femur present, saw [facility] ortho 04/20/2018 with no intervention at this time.  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  Duplex was performed by vascular and no intervention recommended at this time, to wear compression hose.  Last saw cardiology 11/13/20 for 1st degree AV block, to return as needed if symptoms present. Hypertension status: stable  Satisfied with current treatment? yes Duration of hypertension: chronic BP monitoring frequency: not checking BP range: none BP medication side effects:  no Medication compliance: good compliance Aspirin: no Recurrent headaches: no Visual changes: no Palpitations: no Dyspnea: no Chest pain: no Lower extremity edema: occasional Dizzy/lightheaded: no  ANEMIA Has Sickle Cell Trait, her mother had Sickle Cell.  Currently followed by hematology, receiving iron infusions. Anemia status: stable Etiology of anemia: menorrhagia Duration of anemia treatment: long term Compliance with treatment: good compliance Iron supplementation side effects: no Severity of anemia: moderate Fatigue: no Decreased exercise tolerance: no  Dyspnea on exertion: no Palpitations: no Bleeding: no Pica: no    Functional Status Survey: Is the patient deaf or have difficulty hearing?: No Does the patient have difficulty seeing, even when wearing glasses/contacts?: No Does the patient have difficulty concentrating, remembering, or making decisions?: No Does the patient have difficulty walking or climbing stairs?: No Does the patient have difficulty dressing or bathing?: No Does the patient have difficulty doing errands alone such as visiting a doctor's office or shopping?: No   Depression Screen done today and results listed below:     10/17/2022    8:58 AM 06/03/2022    8:34 AM 12/05/2021    8:16 AM 10/15/2021    9:14 AM 08/16/2020    9:04 AM  Depression screen PHQ 2/9  Decreased Interest 0 0 0 0 0  Down, Depressed, Hopeless 0 0 0 0 0  PHQ - 2 Score 0 0 0 0 0  Altered sleeping 1 0 0 0   Tired, decreased energy 1 0 0 1   Change in appetite 0 0 0 0   Feeling bad or failure about yourself  0 0 0 0   Trouble concentrating 0 0 0 0   Moving slowly or fidgety/restless 0 0 0 0   Suicidal thoughts 0 0 0 0   PHQ-9 Score 2 0 0 1   Difficult doing work/chores Not difficult at all Not difficult at all Not difficult at all Not difficult at all       10/17/2022    8:58 AM 06/03/2022    8:34 AM 12/05/2021    8:16 AM 10/15/2021    9:14 AM  GAD 7 : Generalized Anxiety Score  Nervous, Anxious, on Edge 0 0 0  0  Control/stop worrying 0 0 0 0  Worry too much - different things 0 0 0 0  Trouble relaxing 0 0 0 0  Restless 0 0 0 0  Easily annoyed or irritable 0 0 0 0  Afraid - awful might happen 0 0 0 0  Total GAD 7 Score 0 0 0 0  Anxiety Difficulty Not difficult at all Not difficult at all Not difficult at all Not difficult at all       01/29/2022    4:00 PM 02/22/2022    9:49 PM 03/25/2022   12:00 PM 06/03/2022    8:33 AM 10/17/2022    8:57 AM  Fall Risk  Falls in the past year?    0 0  Was there an injury with Fall?    0 0  Fall Risk Category Calculator    0 0  (RETIRED) Patient Fall Risk  Level Low fall risk Low fall risk Low fall risk    Patient at Risk for Falls Due to    No Fall Risks No Fall Risks  Fall risk Follow up    Falls evaluation completed Falls evaluation completed     Past Medical History:  Past Medical History:  Diagnosis Date   Allergy     Surgical History:  Past Surgical History:  Procedure Laterality Date   STENT PLACEMENT VASCULAR ([facility] HX)     TUBAL LIGATION      Medications:  Current Outpatient Medications on File Prior to Visit  Medication Sig   Cholecalciferol (VITAMIN D3) 1.25 MG (50000 UT) CAPS Take 1 capsule by mouth once a week.   EPINEPHrine 0.3 mg/0.3 mL IJ SOAJ injection Inject 0.3 mg into the muscle as needed.    Multiple Vitamin (MULTIVITAMIN) tablet Take 1 tablet by mouth daily.   vitamin B-12 (CYANOCOBALAMIN) 250 MCG tablet Take 250 mcg by mouth daily.   zinc gluconate 50 MG tablet Take 50 mg by mouth daily.   No current facility-administered medications on file prior to visit.    Allergies:  No Known Allergies  Social History:  Social History   Socioeconomic History   Marital status: Single    Spouse name: Not on file   Number of children: Not on file   Years of education: Not on file   Highest education level: Not on file  Occupational History   Not on file  Tobacco Use   Smoking status: Never   Smokeless tobacco: Never  Vaping Use   Vaping status: Never Used  Substance and Sexual Activity   Alcohol use: Yes    Alcohol/week: 1.0 standard drink of alcohol    Types: 1 Glasses of wine per week   Drug use: Not Currently   Sexual activity: Yes  Other Topics Concern   Not on file  Social History Narrative   Not on file   Social Determinants of Health   Financial Resource Strain: Low Risk  (08/16/2020)   Overall Financial Resource Strain (CARDIA)    Difficulty of Paying Living Expenses: Not hard at all  Food Insecurity: No Food Insecurity (08/16/2020)   Hunger Vital Sign    Worried About Running Out of Food  in the Last Year: Never true    Ran Out of Food in the Last Year: Never true  Transportation Needs: No Transportation Needs (08/16/2020)   PRAPARE - Administrator, Civil Service (Medical): No    Lack of Transportation (Non-Medical): No  Physical Activity: Sufficiently Active (08/16/2020)  Exercise Vital Sign    Days of Exercise per Week: 3 days    Minutes of Exercise per Session: 50 min  Stress: No Stress Concern Present (08/16/2020)   [employer] of Occupational Health - Occupational Stress Questionnaire    Feeling of Stress : Not at all  Social Connections: Socially Isolated (08/16/2020)   Social Connection and Isolation Panel [NHANES]    Frequency of Communication with Friends and Family: More than three times a week    Frequency of Social Gatherings with Friends and Family: More than three times a week    Attends Religious Services: Never    Database administrator or Organizations: No    Attends Engineer, structural: Never    Marital Status: Never married  Catering manager Violence: Not on file   Social History   Tobacco Use  Smoking Status Never  Smokeless Tobacco Never   Social History   Substance and Sexual Activity  Alcohol Use Yes   Alcohol/week: 1.0 standard drink of alcohol   Types: 1 Glasses of wine per week    Family History:  Family History  Problem Relation Age of Onset   Congestive Heart Failure Mother    Pulmonary Hypertension Mother    COPD Mother    Sickle cell anemia Mother    Irritable bowel syndrome Sister     Past medical history, surgical history, medications, allergies, family history and social history reviewed with patient today and changes made to appropriate areas of the chart.   Review of Systems - negative All other ROS negative except what is listed above and in the HPI.      Objective:    BP 126/78 (BP Location: Left Arm, Patient Position: Sitting, Cuff Size: Normal)   Pulse 75   Temp 98.1 F (36.7 C)  (Oral)   Ht 5' 7.01" (1.702 m)   Wt 217 lb 12.8 oz (98.8 kg)   SpO2 96%   BMI 34.10 kg/m   Wt Readings from Last 3 Encounters:  10/17/22 217 lb 12.8 oz (98.8 kg)  09/30/22 218 lb 14.4 oz (99.3 kg)  09/12/22 195 lb (88.5 kg)    Physical Exam Vitals and nursing note reviewed. Exam conducted with a chaperone present.  Constitutional:      General: She is awake. She is not in acute distress.    Appearance: She is well-developed and well-groomed. She is not ill-appearing or toxic-appearing.  HENT:     Head: Normocephalic and atraumatic.     Right Ear: Hearing, tympanic membrane, ear canal and external ear normal. No drainage.     Left Ear: Hearing, tympanic membrane, ear canal and external ear normal. No drainage.     Nose: Nose normal.     Right Sinus: No maxillary sinus tenderness or frontal sinus tenderness.     Left Sinus: No maxillary sinus tenderness or frontal sinus tenderness.     Mouth/Throat:     Mouth: Mucous membranes are moist.     Pharynx: Oropharynx is clear. Uvula midline. No pharyngeal swelling, oropharyngeal exudate or posterior oropharyngeal erythema.  Eyes:     General: Lids are normal.        Right eye: No discharge.        Left eye: No discharge.     Extraocular Movements: Extraocular movements intact.     Conjunctiva/sclera: Conjunctivae normal.     Pupils: Pupils are equal, round, and reactive to light.     Visual Fields: Right eye visual fields normal  and left eye visual fields normal.  Neck:     Thyroid: No thyromegaly.     Vascular: No carotid bruit.     Trachea: Trachea normal.  Cardiovascular:     Rate and Rhythm: Normal rate and regular rhythm.     Heart sounds: Normal heart sounds. No murmur heard.    No gallop.  Pulmonary:     Effort: Pulmonary effort is normal. No accessory muscle usage or respiratory distress.     Breath sounds: Normal breath sounds.  Chest:  Breasts:    Right: Normal.     Left: Normal.  Abdominal:     General: Bowel  sounds are normal.     Palpations: Abdomen is soft. There is no hepatomegaly or splenomegaly.     Tenderness: There is no abdominal tenderness.  Musculoskeletal:        General: Normal range of motion.     Cervical back: Normal range of motion and neck supple.     Right lower leg: No edema.     Left lower leg: No edema.  Lymphadenopathy:     Head:     Right side of head: No submental, submandibular, tonsillar, preauricular or posterior auricular adenopathy.     Left side of head: No submental, submandibular, tonsillar, preauricular or posterior auricular adenopathy.     Cervical: No cervical adenopathy.     Upper Body:     Right upper body: No supraclavicular, axillary or pectoral adenopathy.     Left upper body: No supraclavicular, axillary or pectoral adenopathy.  Skin:    General: Skin is warm and dry.     Capillary Refill: Capillary refill takes less than 2 seconds.     Findings: No rash.  Neurological:     Mental Status: She is alert and oriented to person, place, and time.     Gait: Gait is intact.     Deep Tendon Reflexes: Reflexes are normal and symmetric.     Reflex Scores:      Brachioradialis reflexes are 2+ on the right side and 2+ on the left side.      Patellar reflexes are 2+ on the right side and 2+ on the left side. Psychiatric:        Attention and Perception: Attention normal.        Mood and Affect: Mood normal.        Speech: Speech normal.        Behavior: Behavior normal. Behavior is cooperative.        Thought Content: Thought content normal.        Judgment: Judgment normal.    Results for orders placed or performed in visit on 09/30/22  Ferritin  Result Value Ref Range   Ferritin 7 (L) 11 - 307 ng/mL  Iron and TIBC  Result Value Ref Range   Iron 60 28 - 170 ug/dL   TIBC 409 811 - 914 ug/dL   Saturation Ratios 15 10.4 - 31.8 %   UIBC 347 ug/dL  CBC with Differential/Platelet  Result Value Ref Range   WBC 8.2 4.0 - 10.5 K/uL   RBC 3.84 (L) 3.87  - 5.11 MIL/uL   Hemoglobin 10.9 (L) 12.0 - 15.0 g/dL   HCT 78.2 (L) 95.6 - 21.3 %   MCV 85.9 80.0 - 100.0 fL   MCH 28.4 26.0 - 34.0 pg   MCHC 33.0 30.0 - 36.0 g/dL   RDW 08.6 57.8 - 46.9 %   Platelets 299 150 - 400  K/uL   nRBC 0.0 0.0 - 0.2 %   Neutrophils Relative % 58 %   Neutro Abs 4.7 1.7 - 7.7 K/uL   Lymphocytes Relative 30 %   Lymphs Abs 2.5 0.7 - 4.0 K/uL   Monocytes Relative 8 %   Monocytes Absolute 0.7 0.1 - 1.0 K/uL   Eosinophils Relative 4 %   Eosinophils Absolute 0.3 0.0 - 0.5 K/uL   Basophils Relative 0 %   Basophils Absolute 0.0 0.0 - 0.1 K/uL   Immature Granulocytes 0 %   Abs Immature Granulocytes 0.03 0.00 - 0.07 K/uL      Assessment & Plan:   Problem List Items Addressed This Visit       Cardiovascular and Mediastinum   Essential hypertension    Chronic, stable.  BP on recheck at goal. Recommend she monitor BP at least a few mornings a week at home and document.  DASH diet at home.  Will continue diet focus at this time. Labs today: CBC, CMP, TSH.       Relevant Orders   Comprehensive metabolic panel   Lipid Panel w/o Chol/HDL Ratio   First degree AV block    Stable, no symptoms at this time.  Return to cardiology as needed.      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  Recommend she wear compression hose daily at work, as notices this more on 12 hours shifts.  May obtain compression hose at [facility].  Return to vascular if ongoing issues or if pain presents.        Other   Iron deficiency anemia    Chronic, stable.  Followed by hematology.  Recent notes reviewed and labs.      Low TSH level    Check TSH and Free T4 today and initiate medication or refer to endocrinology as needed.      Relevant Orders   TSH   T4, free   Obesity (BMI 30.0-34.9)    BMI 34.10. Recommended eating smaller high protein, low fat meals more frequently and exercising 30 mins a day 5 times a week with a goal of 10-15lb weight loss in the next 3 months. Patient  voiced their understanding and motivation to adhere to these recommendations.       Sickle cell trait (HCC) - Primary    Followed by hematology, continue this collaboration.  Discussed with patient today.      Vitamin D deficiency    History of low levels, recheck today and continue supplements.      Relevant Orders   VITAMIN D 25 Hydroxy (Vit-D Deficiency, Fractures)   Other Visit Diagnoses     Screen for STD (sexually transmitted disease)       STD screening today per request.   Relevant Orders   GC/Chlamydia Probe Amp   RPR   HIV Antibody (routine testing w rflx)   Encounter for screening mammogram for malignant neoplasm of breast       Mammogram ordered today and discussed with patient where to schedule.   Relevant Orders   MM 3D SCREENING MAMMOGRAM BILATERAL BREAST   Encounter for annual physical exam       Annual physical -- performed today and health maintenance reviewed.        Follow up plan: Return in about 1 year (around 10/17/2023) for Annual physical.   LABORATORY TESTING:  - Pap smear: Refuses today -- is due for repeat due to history of + HPV 2020, recent pap 2021 was  negative for HPV  IMMUNIZATIONS:   - Tdap: Tetanus vaccination status reviewed: last tetanus booster within 10 years. - Influenza: Up to date - Pneumovax: Not applicable - Prevnar: Not applicable - HPV: Not applicable - Zostavax vaccine: Not applicable  - Covid == has had x 2  SCREENING: -Mammogram: Ordered Today - Colonoscopy: Not applicable  - Bone Density: Not applicable  -Hearing Test: Not applicable  -Spirometry: Not applicable   PATIENT COUNSELING:   Advised to take 1 mg of folate supplement per day if capable of pregnancy.   Sexuality: Discussed sexually transmitted diseases, partner selection, use of condoms, avoidance of unintended pregnancy  and contraceptive alternatives.   Advised to avoid cigarette smoking.  I discussed with the patient that most people either  abstain from alcohol or drink within safe limits (<=14/week and <=4 drinks/occasion for males, <=7/weeks and <= 3 drinks/occasion for females) and that the risk for alcohol disorders and other health effects rises proportionally with the number of drinks per week and how often a drinker exceeds daily limits.  Discussed cessation/primary prevention of drug use and availability of treatment for abuse.   Diet: Encouraged to adjust caloric intake to maintain  or achieve ideal body weight, to reduce intake of dietary saturated fat and total fat, to limit sodium intake by avoiding high sodium foods and not adding table salt, and to maintain adequate dietary potassium and calcium preferably from fresh fruits, vegetables, and low-fat dairy products.    Stressed the importance of regular exercise  Injury prevention: Discussed safety belts, safety helmets, smoke detector, smoking near bedding or upholstery.   Dental health: Discussed importance of regular tooth brushing, flossing, and dental visits.    NEXT PREVENTATIVE PHYSICAL DUE IN 1 YEAR. Return in about 1 year (around 10/17/2023) for Annual physical.

## 2022-10-17 NOTE — Assessment & Plan Note (Signed)
Check TSH and Free T4 today and initiate medication or refer to endocrinology as needed.

## 2022-10-17 NOTE — Assessment & Plan Note (Signed)
Chronic, stable.  BP on recheck at goal. Recommend she monitor BP at least a few mornings a week at home and document.  DASH diet at home.  Will continue diet focus at this time. Labs today: CBC, CMP, TSH.

## 2022-10-17 NOTE — Assessment & Plan Note (Addendum)
BMI 34.10.  Recommended eating smaller high protein, low fat meals more frequently and exercising 30 mins a day 5 times a week with a goal of 10-15lb weight loss in the next 3 months. Patient voiced their understanding and motivation to adhere to these recommendations.  

## 2022-10-18 ENCOUNTER — Other Ambulatory Visit: Payer: Self-pay | Admitting: Nurse Practitioner

## 2022-10-18 DIAGNOSIS — R7989 Other specified abnormal findings of blood chemistry: Secondary | ICD-10-CM

## 2022-10-18 LAB — LIPID PANEL W/O CHOL/HDL RATIO
Cholesterol, Total: 142 mg/dL (ref 100–199)
HDL: 59 mg/dL (ref >39)
LDL Chol Calc (NIH): 63 mg/dL (ref 0–99)
Triglycerides: 114 mg/dL (ref 0–149)
VLDL Cholesterol Cal: 20 mg/dL (ref 5–40)

## 2022-10-18 LAB — COMPREHENSIVE METABOLIC PANEL
ALT: 10 IU/L (ref 0–32)
AST: 16 IU/L (ref 0–40)
Albumin: 4 g/dL (ref 3.9–4.9)
Alkaline Phosphatase: 55 IU/L (ref 44–121)
BUN/Creatinine Ratio: 9 (ref 9–23)
BUN: 8 mg/dL (ref 6–24)
Bilirubin Total: 0.4 mg/dL (ref 0.0–1.2)
CO2: 24 mmol/L (ref 20–29)
Calcium: 9 mg/dL (ref 8.7–10.2)
Chloride: 105 mmol/L (ref 96–106)
Creatinine, Ser: 0.86 mg/dL (ref 0.57–1.00)
Globulin, Total: 2.6 g/dL (ref 1.5–4.5)
Glucose: 88 mg/dL (ref 70–99)
Potassium: 3.7 mmol/L (ref 3.5–5.2)
Sodium: 142 mmol/L (ref 134–144)
Total Protein: 6.6 g/dL (ref 6.0–8.5)
eGFR: 88 mL/min/{1.73_m2} (ref >59)

## 2022-10-18 LAB — T4, FREE: Free T4: 1.17 ng/dL (ref 0.82–1.77)

## 2022-10-18 LAB — RPR: RPR Ser Ql: NONREACTIVE

## 2022-10-18 LAB — VITAMIN D 25 HYDROXY (VIT D DEFICIENCY, FRACTURES): Vit D, 25-Hydroxy: 28.5 ng/mL — ABNORMAL LOW (ref 30.0–100.0)

## 2022-10-18 LAB — HIV ANTIBODY (ROUTINE TESTING W REFLEX): HIV Screen 4th Generation wRfx: NONREACTIVE

## 2022-10-18 LAB — TSH: TSH: 0.384 u[IU]/mL — ABNORMAL LOW (ref 0.450–4.500)

## 2022-10-18 NOTE — Progress Notes (Signed)
Contacted via MyChart -- lab only visit in 4 weeks please:)   Good evening Kathleen [name], everything looks great with exception of Vitamin D and thyroid.  Thyroid levels show TSH with mild low level, but Free T4 normal.  I would like to recheck this on outpatient labs in 4 weeks to ensure no worsening -- currently TSH is more hyperthyroid.  Vitamin D level remains a little low, please ensure you are taking Vitamin D3 2000 units daily.  Any questions? Keep being awesome!!  Thank you for allowing me to participate in your care.  I appreciate you. Kindest regards, Wendle Kina

## 2022-10-20 ENCOUNTER — Encounter: Payer: 59 | Admitting: Licensed Practical Nurse

## 2022-10-20 NOTE — Progress Notes (Signed)
Attempted to reach patient, LVM for call back to office to get scheduled for her 4 week lab follow up visit.  Put in CRM.

## 2022-10-21 LAB — GC/CHLAMYDIA PROBE AMP
Chlamydia trachomatis, NAA: NEGATIVE
Neisseria Gonorrhoeae by PCR: NEGATIVE

## 2022-10-21 NOTE — Progress Notes (Signed)
Contacted via MyChart   Good afternoon, your urine testing is negative!!!  Woohoo!!

## 2022-10-24 MED FILL — Iron Sucrose Inj 20 MG/ML (Fe Equiv): INTRAVENOUS | Qty: 10 | Status: AC

## 2022-10-25 ENCOUNTER — Encounter: Payer: Self-pay | Admitting: Oncology

## 2022-10-27 ENCOUNTER — Inpatient Hospital Stay: Payer: 59

## 2022-10-31 MED FILL — Iron Sucrose Inj 20 MG/ML (Fe Equiv): INTRAVENOUS | Qty: 10 | Status: AC

## 2022-11-03 ENCOUNTER — Inpatient Hospital Stay: Payer: 59

## 2022-11-04 ENCOUNTER — Ambulatory Visit (INDEPENDENT_AMBULATORY_CARE_PROVIDER_SITE_OTHER): Payer: 59 | Admitting: Licensed Practical Nurse

## 2022-11-04 VITALS — BP 140/99 | HR 92 | Wt 207.3 lb

## 2022-11-04 DIAGNOSIS — N920 Excessive and frequent menstruation with regular cycle: Secondary | ICD-10-CM | POA: Insufficient documentation

## 2022-11-04 NOTE — Progress Notes (Signed)
HPI:      [name] is a 42 y.o. No obstetric history on file. who LMP was October 10, 2022 presents today for a problem visit.  She complains of heavy menstrual bleeding that  began 1-2 years ago and its severity is described as severe.  She has regular periods every month and they are associated with moderate menstrual cramping, she uses Motrin 800mg  with good relief. Her cycles last 3-7 days, the first 2 days are heavy where she needs to  change a super tampon and pad in an hour. She has become anemic and has needed Iron transfusions. She has never seen a provider about this because she assumed this was a normal part of aging. She has never been told that she has fibroids. She has a hx of hypertension currently not medicated, she does not use tobacco, She thinks her mother went though menopause in her 84's (her mother passed away in her 42's from COPD)  Kathleen [name] does have a hx of hyperthyroid, last TSH 0.384, free T 1.17  Menarche 6th grade. Previous Cycles usually monthly, last 5-7 days and not heavy   She is sexually active with both mean and women, she has had a tubal ligation and   She used Depo in the past for birth control, she did not like it, it made her gain weight.   PMHx: She  has a past medical history of Allergy. Also,  has a past surgical history that includes Tubal ligation and STENT PLACEMENT VASCULAR ([facility] HX)., family history includes COPD in her mother; Congestive Heart Failure in her mother; Irritable bowel syndrome in her sister; Pulmonary Hypertension in her mother; Sickle cell anemia in her mother.,  reports that she has never smoked. She has never used smokeless tobacco. She reports current alcohol use of about 1.0 standard drink of alcohol per week. She reports that she does not currently use drugs.  She  Current Outpatient Medications:    Cholecalciferol (VITAMIN D3) 1.25 MG (50000 UT) CAPS, Take 1 capsule by mouth once a week., Disp: , Rfl:    EPINEPHrine  0.3 mg/0.3 mL IJ SOAJ injection, Inject 0.3 mg into the muscle as needed. , Disp: , Rfl:    Multiple Vitamin (MULTIVITAMIN) tablet, Take 1 tablet by mouth daily., Disp: , Rfl:    vitamin B-12 (CYANOCOBALAMIN) 250 MCG tablet, Take 250 mcg by mouth daily., Disp: , Rfl:    zinc gluconate 50 MG tablet, Take 50 mg by mouth daily., Disp: , Rfl:   Also, has No Known Allergies.  ROS see HPI   Objective: BP (!) 140/99   Pulse 92   Wt 207 lb 4.8 oz (94 kg)   BMI 32.46 kg/m  Physical Exam Constitutional:      Appearance: Normal appearance.  Genitourinary:     Vulva normal.     Genitourinary Comments: Cervix pink, no lesions, no bleeding  Vagina WNL  Bimanual exam: uterus non gravid size, no masses non tenderness. Adnexa non tender  no masses  Neurological:     Mental Status: She is alert.     ASSESSMENT/PLAN:  heavy menstrual bleeding with regular cycles  Problem List Items Addressed This Visit   None Visit Diagnoses     Menorrhagia with regular cycle    -  Primary   Relevant Orders   US PELVIS TRANSVAGINAL NON-OB (TV ONLY)      -reviewed possible reasons for abnormal uterine  bleeding, age, abnormal thyroid, obesity, or possible fibroids.   -  will try Slynd, if pt is happy with this she is send a mychart message for a prescription. H.O for Mirena given, pt will call and schedule if she desires once Pelvic US is completed.  Carie Caddy, CNM   [facility]  11/04/22  2:09 PM

## 2022-11-07 ENCOUNTER — Inpatient Hospital Stay: Payer: 59 | Attending: Nurse Practitioner

## 2022-11-07 VITALS — BP 135/95 | HR 98 | Temp 97.6°F

## 2022-11-07 DIAGNOSIS — D500 Iron deficiency anemia secondary to blood loss (chronic): Secondary | ICD-10-CM | POA: Insufficient documentation

## 2022-11-07 DIAGNOSIS — D508 Other iron deficiency anemias: Secondary | ICD-10-CM

## 2022-11-07 DIAGNOSIS — N920 Excessive and frequent menstruation with regular cycle: Secondary | ICD-10-CM | POA: Insufficient documentation

## 2022-11-07 MED ORDER — SODIUM CHLORIDE 0.9 % IV SOLN
200.0000 mg | INTRAVENOUS | Status: DC
  Administered 2022-11-07: 200 mg via INTRAVENOUS
  Filled 2022-11-07: qty 200

## 2022-11-07 MED ORDER — SODIUM CHLORIDE 0.9 % IV SOLN
Freq: Once | INTRAVENOUS | Status: AC
  Filled 2022-11-07: qty 250

## 2022-11-12 ENCOUNTER — Inpatient Hospital Stay: Payer: 59

## 2022-11-12 VITALS — BP 139/100 | HR 74 | Temp 97.9°F | Resp 18

## 2022-11-12 DIAGNOSIS — D500 Iron deficiency anemia secondary to blood loss (chronic): Secondary | ICD-10-CM | POA: Diagnosis not present

## 2022-11-12 DIAGNOSIS — D508 Other iron deficiency anemias: Secondary | ICD-10-CM

## 2022-11-12 MED ORDER — SODIUM CHLORIDE 0.9 % IV SOLN
200.0000 mg | INTRAVENOUS | Status: DC
  Administered 2022-11-12: 200 mg via INTRAVENOUS
  Filled 2022-11-12: qty 200

## 2022-11-12 MED ORDER — SODIUM CHLORIDE 0.9 % IV SOLN
INTRAVENOUS | Status: DC
  Filled 2022-11-12: qty 250

## 2022-11-13 ENCOUNTER — Inpatient Hospital Stay: Admission: RE | Admit: 2022-11-13 | Payer: 59 | Source: Ambulatory Visit

## 2022-11-17 ENCOUNTER — Inpatient Hospital Stay: Payer: 59

## 2022-11-17 VITALS — BP 118/93 | HR 78 | Temp 97.3°F | Resp 16

## 2022-11-17 DIAGNOSIS — D508 Other iron deficiency anemias: Secondary | ICD-10-CM

## 2022-11-17 DIAGNOSIS — D500 Iron deficiency anemia secondary to blood loss (chronic): Secondary | ICD-10-CM | POA: Diagnosis not present

## 2022-11-17 MED ORDER — SODIUM CHLORIDE 0.9 % IV SOLN
200.0000 mg | INTRAVENOUS | Status: DC
  Administered 2022-11-17: 200 mg via INTRAVENOUS
  Filled 2022-11-17: qty 200

## 2022-11-17 NOTE — Patient Instructions (Signed)
 Iron Sucrose Injection What is this medication? IRON SUCROSE (EYE ern SOO krose) treats low levels of iron (iron deficiency anemia) in people with kidney disease. Iron is a mineral that plays an important role in making red blood cells, which carry oxygen from your lungs to the rest of your body. This medicine may be used for other purposes; ask your health care provider or pharmacist if you have questions. COMMON BRAND NAME(S): Venofer What should I tell my care team before I take this medication? They need to know if you have any of these conditions: Anemia not caused by low iron levels Heart disease High levels of iron in the blood Kidney disease Liver disease An unusual or allergic reaction to iron, other medications, foods, dyes, or preservatives Pregnant or trying to get pregnant Breastfeeding How should I use this medication? This medication is for infusion into a vein. It is given in a hospital or clinic setting. Talk to your care team about the use of this medication in children. While this medication may be prescribed for children as young as 2 years for selected conditions, precautions do apply. Overdosage: If you think you have taken too much of this medicine contact a poison control center or emergency room at once. NOTE: This medicine is only for you. Do not share this medicine with others. What if I miss a dose? Keep appointments for follow-up doses. It is important not to miss your dose. Call your care team if you are unable to keep an appointment. What may interact with this medication? Do not take this medication with any of the following: Deferoxamine Dimercaprol Other iron products This medication may also interact with the following: Chloramphenicol Deferasirox This list may not describe all possible interactions. Give your health care provider a list of all the medicines, herbs, non-prescription drugs, or dietary supplements you use. Also tell them if you smoke,  drink alcohol, or use illegal drugs. Some items may interact with your medicine. What should I watch for while using this medication? Visit your care team regularly. Tell your care team if your symptoms do not start to get better or if they get worse. You may need blood work done while you are taking this medication. You may need to follow a special diet. Talk to your care team. Foods that contain iron include: whole grains/cereals, dried fruits, beans, or peas, leafy green vegetables, and organ meats (liver, kidney). What side effects may I notice from receiving this medication? Side effects that you should report to your care team as soon as possible: Allergic reactions--skin rash, itching, hives, swelling of the face, lips, tongue, or throat Low blood pressure--dizziness, feeling faint or lightheaded, blurry vision Shortness of breath Side effects that usually do not require medical attention (report to your care team if they continue or are bothersome): Flushing Headache Joint pain Muscle pain Nausea Pain, redness, or irritation at injection site This list may not describe all possible side effects. Call your doctor for medical advice about side effects. You may report side effects to FDA at 1-800-FDA-1088. Where should I keep my medication? This medication is given in a hospital or clinic. It will not be stored at home. NOTE: This sheet is a summary. It may not cover all possible information. If you have questions about this medicine, talk to your doctor, pharmacist, or health care provider.  2024 Elsevier/Gold Standard (2022-08-29 00:00:00)

## 2022-11-17 NOTE — Progress Notes (Signed)
Declined 30 minute post observation. Aware of risks. Vitals stable at discharge.  

## 2022-11-18 ENCOUNTER — Ambulatory Visit
Admission: RE | Admit: 2022-11-18 | Discharge: 2022-11-18 | Disposition: A | Discharge status: Home / Self Care | Payer: 59 | Source: Ambulatory Visit | Attending: Licensed Practical Nurse | Admitting: Licensed Practical Nurse

## 2022-11-18 DIAGNOSIS — N920 Excessive and frequent menstruation with regular cycle: Secondary | ICD-10-CM

## 2022-12-09 ENCOUNTER — Encounter: Payer: Self-pay | Admitting: Licensed Practical Nurse

## 2022-12-09 NOTE — Progress Notes (Signed)
TC to pt Calling to follow up on your visit on July 30. Marvelous reports she had a cycle last over 2 weeks while on Slynd so she stopped it.  Reviewed Pelvic US, pt aware that she has a fibroid. Your uterine lining is 11mm, which can be normal for a person that has periods. But could explain why you have heavy cycles.  Recommend following up with Dr Valentino Saxon or Logan Bores for management of heavy menstrual bleeding.  Pt to call office and schedule apt. Carie Caddy, CNM  , [facility]  12/09/22  3:28 PM

## 2023-01-27 ENCOUNTER — Inpatient Hospital Stay: Payer: 59 | Attending: Nurse Practitioner

## 2023-01-27 ENCOUNTER — Inpatient Hospital Stay: Payer: 59

## 2023-01-27 ENCOUNTER — Inpatient Hospital Stay: Payer: 59 | Admitting: Oncology

## 2023-02-09 ENCOUNTER — Inpatient Hospital Stay: Payer: 59 | Admitting: Oncology

## 2023-02-09 ENCOUNTER — Inpatient Hospital Stay: Payer: 59 | Attending: Nurse Practitioner

## 2023-02-09 ENCOUNTER — Inpatient Hospital Stay: Payer: 59

## 2023-03-26 ENCOUNTER — Ambulatory Visit: Payer: 59 | Admitting: Nurse Practitioner

## 2023-03-26 ENCOUNTER — Ambulatory Visit: Payer: 59

## 2023-03-26 ENCOUNTER — Other Ambulatory Visit: Payer: 59

## 2023-04-23 ENCOUNTER — Ambulatory Visit: Payer: 59 | Admitting: Nurse Practitioner

## 2023-04-23 DIAGNOSIS — R1031 Right lower quadrant pain: Secondary | ICD-10-CM

## 2023-05-05 ENCOUNTER — Ambulatory Visit: Payer: 59 | Admitting: Nurse Practitioner

## 2023-05-18 ENCOUNTER — Ambulatory Visit: Payer: Self-pay | Admitting: *Deleted

## 2023-05-18 NOTE — Telephone Encounter (Signed)
  Chief Complaint: L ankle pain Symptoms: Patient thinks she may have inured her ankle in December- but never had it checked. Patient feels intermittent pain in the ankle and does have swelling. Patient states it can get so bad she limps at work- but it gets better when she is able to elevate and rest it.  Frequency: ongoing- possible twisted ankle in December Pertinent Negatives: Patient denies redness/heat Disposition: [] ED /[] Urgent Care (no appt availability in office) / [x] Appointment(In office/virtual)/ []  [facility]/ [] Home Care/ [] Refused Recommended Disposition /[] [facility]/ []  Follow-up with PCP Additional Notes: Patient has been scheduled for evaluation of her ankle/leg. Patient states she is also concerned about "fatty" tissue in her left side.

## 2023-05-18 NOTE — Telephone Encounter (Signed)
 Reason for Disposition  Ankle pain is a chronic symptom (recurrent or ongoing AND present > 4 weeks)  Answer Assessment - Initial Assessment Questions 1. ONSET: "When did the pain start?"      Dec- stepped wrong- felt twisted ankle- pain stopped. Patient will have ankle pain/tenderness 2. LOCATION: "Where is the pain located?"      Left ankle- hx of leg swelling 3. PAIN: "How bad is the pain?"    (Scale 1-10; or mild, moderate, severe)  - MILD (1-3): doesn't interfere with normal activities.   - MODERATE (4-7): interferes with normal activities (e.g., work or school) or awakens from sleep, limping.   - SEVERE (8-10): excruciating pain, unable to do any normal activities, unable to walk.      Mild/moderate 4. WORK OR EXERCISE: "Has there been any recent work or exercise that involved this part of the body?"      no 5. CAUSE: "What do you think is causing the ankle pain?"     Possible undiagnosed injury 6. OTHER SYMPTOMS: "Do you have any other symptoms?" (e.g., calf pain, rash, fever, swelling)     Swelling- inner pain  Protocols used: Ankle Pain-A-AH

## 2023-05-19 ENCOUNTER — Ambulatory Visit: Payer: 59 | Admitting: Nurse Practitioner

## 2023-05-19 VITALS — BP 136/80 | HR 84 | Temp 98.1°F | Ht 67.0 in | Wt 216.4 lb

## 2023-05-19 DIAGNOSIS — D171 Benign lipomatous neoplasm of skin and subcutaneous tissue of trunk: Secondary | ICD-10-CM

## 2023-05-19 DIAGNOSIS — M25572 Pain in left ankle and joints of left foot: Secondary | ICD-10-CM | POA: Insufficient documentation

## 2023-05-19 DIAGNOSIS — E66811 Obesity, class 1: Secondary | ICD-10-CM | POA: Diagnosis not present

## 2023-05-19 DIAGNOSIS — I10 Essential (primary) hypertension: Secondary | ICD-10-CM | POA: Diagnosis not present

## 2023-05-19 NOTE — Assessment & Plan Note (Signed)
Chronic, stable.  BP on recheck at goal. Recommend she monitor BP at least a few mornings a week at home and document.  DASH diet at home.  Will continue diet focus at this time. Labs today: up to date.

## 2023-05-19 NOTE — Assessment & Plan Note (Signed)
Initially noted in 2021, but now having more discomfort to area with movement.  Will repeat imaging to check on size of area .  If increasing in size will place general surgery referral to further check on area and offer recommendations.  Discussed with patient.

## 2023-05-19 NOTE — Assessment & Plan Note (Signed)
Since December when stepped wrong at work.  Reports ongoing discomfort with walking, none at rest.  Recommend she start using Voltaren gel to area as needed BID and ice application as needed during day.  Discussed need for ankle compression sleeve at work to support ankle + may benefit higher work boots for support.  She will trial simple treatment and if ongoing pain will place referral to podiatry.

## 2023-05-19 NOTE — Assessment & Plan Note (Signed)
BMI 33.89.  Recommended eating smaller high protein, low fat meals more frequently and exercising 30 mins a day 5 times a week with a goal of 10-15lb weight loss in the next 3 months. Patient voiced their understanding and motivation to adhere to these recommendations.

## 2023-05-19 NOTE — Patient Instructions (Signed)
Voltaren gel over the counter -- also called Diclofenac gel Get ankle support sleeve  Please call to schedule your mammogram and/or bone density: [facility] at [facility]  Address: [address], [place], [place] [address] Phone: [phone]  [facility] [address] [place],  [place]  [address] Phone: [phone]    Ankle Pain The ankle joint helps you stand on your leg and allows you to move around. Ankle pain can happen on either side or the back of the ankle. You may have pain in one ankle or both ankles. Ankle pain may be sharp and burning or dull and aching. There may be tenderness, stiffness, redness, or warmth around the ankle. Many things can cause ankle pain. These include an injury to the area and overuse of your ankle. Follow these instructions at home: Activity Rest your ankle as told by your health care provider. Avoid doing things that cause ankle pain. Do not use the injured limb to support your body weight until your provider says that you can. Use crutches as told by your provider. Ask your provider when it is safe to drive if you have a brace on your ankle. Do exercises as told by your provider. If you have a removable brace: Wear the brace as told by your provider. Remove it only as told by your provider. Check the skin around the brace every day. Tell your provider about any concerns. Loosen the brace if your toes tingle, become numb, or turn cold and blue. Keep the brace clean. If the brace is not waterproof: Do not let it get wet. Cover it with a watertight covering when you take a bath or shower. If you have an elastic bandage:  Remove it when you take a bath or a shower. Try not to move your ankle much. Wiggle your toes from time to time. This helps to prevent swelling. Adjust the bandage if it feels too tight. Loosen the bandage if your foot tingles, becomes numb, or turns cold and  blue. Managing pain, stiffness, and swelling  If told, put ice on the painful area. If you have a removable brace or elastic bandage, remove it as told by your provider. Put ice in a plastic bag. Place a towel between your skin and the bag. Leave the ice on for 20 minutes, 2-3 times a day. If your skin turns bright red, remove the ice right away to prevent skin damage. The risk of damage is higher if you cannot feel pain, heat, or cold. Move your toes often to reduce stiffness and swelling. Raise (elevate) your ankle above the level of your heart while you are sitting or lying down. General instructions Take over-the-counter and prescription medicines only as told by your provider. To help you and your provider, write down: How often you have ankle pain. Where the pain is. What the pain feels like. If you are told to wear a certain shoe or insole, make sure you wear it the right way and for as long as you are told. Contact a health care provider if: Your pain gets worse. Your pain does not get better with medicine. You have a fever or chills. You have more trouble walking. You have new symptoms. Your foot, leg, toes, or ankle tingles, becomes numb or swollen, or turns cold and blue. This information is not intended to replace advice given to you by your health care provider. Make sure you discuss any  questions you have with your health care provider. Document Revised: 01/15/2022 Document Reviewed: 01/15/2022 Elsevier Patient Education  2024 [employer].

## 2023-05-19 NOTE — Progress Notes (Signed)
BP 136/80 (BP Location: Left Arm, Patient Position: Sitting, Cuff Size: Large)   Pulse 84   Temp 98.1 F (36.7 C) (Oral)   Ht 5\' 7"  (1.702 m)   Wt 216 lb 6.4 oz (98.2 kg)   SpO2 94%   BMI 33.89 kg/m    Subjective:    Patient ID: Kathleen [name], female    DOB: 1980-09-11, 43 y.o.   MRN: [id number]  HPI: Kathleen [name] is a 43 y.o. female  Chief Complaint  Patient presents with   Ankle Pain    Patient states she has been having a pain in the back of her left ankle for the last couple of months. States she did step wrong at work around this time. States the pain comes and goes. States she wears steel toe shoes at work and works 12 hour shifts so she wonders if what she is feeling can be related to these.    ANKLE PAIN Left ankle pain since December when stepped wrong at work.  Wears steel-toed boots with low top at work.  Works 12 hours on feet.  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   Duration: months Involved foot: left ankle Mechanism of injury: stepped wrong, but no fall Location: Achilles area Onset: gradual  Severity: 10/10 at worst Quality:  dull, aching, tender Frequency: intermittent Radiation: no Aggravating factors: weight bearing, walking, and movement  Alleviating factors: ice and rest  Status: fluctuating Treatments attempted: rest and ice  Relief with NSAIDs?:  moderate Weakness with weight bearing or walking: no Morning stiffness: no Swelling: a little more swollen then baseline Redness: no Bruising: no Paresthesias / decreased sensation: no  Fevers:no   ABDOMINAL PAIN (RIGHT SIDE) Has been present for quite some time, a couple years.  Does not hurt, just pressure depending on how she moves.  Leaning forward.  History of imaging in 2021 to area which noted a lipoma, although now causing a little more discomfort. Duration: years Onset: gradual Severity: mild Quality: pressure Location:  right side  Episode duration: 30  seconds Radiation: no Frequency: intermittent Alleviating factors:  Aggravating factors: Status: stable Treatments attempted: none Fever: no Nausea: no Vomiting: no Weight loss: no Decreased appetite: no Diarrhea: no Constipation: no Blood in stool: no Heartburn: no Jaundice: no Rash: no Dysuria/urinary frequency: no Hematuria: no History of sexually transmitted disease: no Recurrent NSAID use: no  Relevant past medical, surgical, family and social history reviewed and updated as indicated. Interim medical history since our last visit reviewed. Allergies and medications reviewed and updated.  Review of Systems  Constitutional:  Negative for activity change, appetite change, diaphoresis, fatigue and fever.  Respiratory:  Negative for cough, chest tightness and shortness of breath.   Cardiovascular:  Negative for chest pain, palpitations and leg swelling.  Gastrointestinal: Negative.   Musculoskeletal:  Positive for arthralgias.  Neurological:  Negative for dizziness, syncope, weakness, light-headedness, numbness and headaches.  Psychiatric/Behavioral: Negative.      Per HPI unless specifically indicated above     Objective:    BP 136/80 (BP Location: Left Arm, Patient Position: Sitting, Cuff Size: Large)   Pulse 84   Temp 98.1 F (36.7 C) (Oral)   Ht 5\' 7"  (1.702 m)   Wt 216 lb 6.4 oz (98.2 kg)   SpO2 94%   BMI 33.89 kg/m   Wt Readings from Last 3 Encounters:  05/19/23 216 lb 6.4 oz (98.2 kg)  11/04/22 207 lb 4.8 oz (94 kg)  10/17/22 217  lb 12.8 oz (98.8 kg)    Physical Exam Vitals and nursing note reviewed.  Constitutional:      General: She is awake. She is not in acute distress.    Appearance: She is well-developed and well-groomed. She is obese. She is not ill-appearing or toxic-appearing.  HENT:     Head: Normocephalic.     Right Ear: Hearing and external ear normal.     Left Ear: Hearing and external ear normal.  Eyes:     General: Lids are  normal.        Right eye: No discharge.        Left eye: No discharge.     Conjunctiva/sclera: Conjunctivae normal.     Pupils: Pupils are equal, round, and reactive to light.  Neck:     Thyroid: No thyromegaly.     Vascular: No carotid bruit.  Cardiovascular:     Rate and Rhythm: Normal rate and regular rhythm.     Heart sounds: Normal heart sounds. No murmur heard.    No gallop.  Pulmonary:     Effort: Pulmonary effort is normal. No accessory muscle usage or respiratory distress.     Breath sounds: Normal breath sounds.  Abdominal:     General: Bowel sounds are normal. There is no distension.     Palpations: Abdomen is soft.     Tenderness: There is no abdominal tenderness.    Musculoskeletal:     Cervical back: Normal range of motion and neck supple.     Right lower leg: No edema.     Left lower leg: No edema.     Right ankle: Normal.     Left ankle: No swelling. Tenderness present. Normal range of motion. Normal pulse.  Lymphadenopathy:     Cervical: No cervical adenopathy.  Skin:    General: Skin is warm and dry.  Neurological:     Mental Status: She is alert and oriented to person, place, and time.     Deep Tendon Reflexes: Reflexes are normal and symmetric.     Reflex Scores:      Brachioradialis reflexes are 2+ on the right side and 2+ on the left side.      Patellar reflexes are 2+ on the right side and 2+ on the left side. Psychiatric:        Attention and Perception: Attention normal.        Mood and Affect: Mood normal.        Speech: Speech normal.        Behavior: Behavior normal. Behavior is cooperative.        Thought Content: Thought content normal.   Ankle Exam: Left    Inspection: Ankle inspection normal     Tenderness to Palpation:   yes to lower Achille's      Proximal fibula: no    Distal fibula: no    Medial malleolus: no    Lateral malleolus: no    Base of 5th metatarsal: no     Anterior talofibular ligament: no    Calcaneofibular  ligament: no    Posterior talofibular ligament: yes    Deltoid ligament: no    Syndesmosis: no     Range of Motion: Full ROM    Dorsiflexion: Normal    Plantar flexion: Normal     Strength:  5/5 bilaterally    Dorsiflexion:  5/5    Plantar flexion:  5/5     Special Tests:    Anterior drawer: negative  Talar tilt: negative    Syndesmosis squeeze test: negative     Gait:  mild antalgic   Results for orders placed or performed in visit on 10/17/22  GC/Chlamydia Probe Amp   Collection Time: 10/17/22  9:34 AM   Specimen: Urine   UR  Result Value Ref Range   Chlamydia trachomatis, NAA Negative Negative   Neisseria Gonorrhoeae by PCR Negative Negative  Comprehensive metabolic panel   Collection Time: 10/17/22  9:37 AM  Result Value Ref Range   Glucose 88 70 - 99 mg/dL   BUN 8 6 - 24 mg/dL   Creatinine, Ser 6.04 0.57 - 1.00 mg/dL   eGFR 88 >54 UJ/WJX/9.14   BUN/Creatinine Ratio 9 9 - 23   Sodium 142 134 - 144 mmol/L   Potassium 3.7 3.5 - 5.2 mmol/L   Chloride 105 96 - 106 mmol/L   CO2 24 20 - 29 mmol/L   Calcium 9.0 8.7 - 10.2 mg/dL   Total Protein 6.6 6.0 - 8.5 g/dL   Albumin 4.0 3.9 - 4.9 g/dL   Globulin, Total 2.6 1.5 - 4.5 g/dL   Bilirubin Total 0.4 0.0 - 1.2 mg/dL   Alkaline Phosphatase 55 44 - 121 IU/L   AST 16 0 - 40 IU/L   ALT 10 0 - 32 IU/L  Lipid Panel w/o Chol/HDL Ratio   Collection Time: 10/17/22  9:37 AM  Result Value Ref Range   Cholesterol, Total 142 100 - 199 mg/dL   Triglycerides 782 0 - 149 mg/dL   HDL 59 >95 mg/dL   VLDL Cholesterol Cal 20 5 - 40 mg/dL   LDL Chol Calc (NIH) 63 0 - 99 mg/dL  TSH   Collection Time: 10/17/22  9:37 AM  Result Value Ref Range   TSH 0.384 (L) 0.450 - 4.500 uIU/mL  T4, free   Collection Time: 10/17/22  9:37 AM  Result Value Ref Range   Free T4 1.17 0.82 - 1.77 ng/dL  VITAMIN D 25 Hydroxy (Vit-D Deficiency, Fractures)   Collection Time: 10/17/22  9:37 AM  Result Value Ref Range   Vit D, 25-Hydroxy 28.5 (L) 30.0  - 100.0 ng/mL  RPR   Collection Time: 10/17/22  9:37 AM  Result Value Ref Range   RPR Ser Ql Non Reactive Non Reactive  HIV Antibody (routine testing w rflx)   Collection Time: 10/17/22  9:37 AM  Result Value Ref Range   HIV Screen 4th Generation wRfx Non Reactive Non Reactive      Assessment & Plan:   Problem List Items Addressed This Visit       Cardiovascular and Mediastinum   Essential hypertension   Chronic, stable.  BP on recheck at goal. Recommend she monitor BP at least a few mornings a week at home and document.  DASH diet at home.  Will continue diet focus at this time. Labs today: up to date.         Other   Abdominal lipoma - Primary   Initially noted in 2021, but now having more discomfort to area with movement.  Will repeat imaging to check on size of area .  If increasing in size will place general surgery referral to further check on area and offer recommendations.  Discussed with patient.      Relevant Orders   US Abdomen Limited   Acute left ankle pain   Since December when stepped wrong at work.  Reports ongoing discomfort with walking, none at rest.  Recommend she start  using Voltaren gel to area as needed BID and ice application as needed during day.  Discussed need for ankle compression sleeve at work to support ankle + may benefit higher work boots for support.  She will trial simple treatment and if ongoing pain will place referral to podiatry.      Obesity (BMI 30.0-34.9)   BMI 33.89. Recommended eating smaller high protein, low fat meals more frequently and exercising 30 mins a day 5 times a week with a goal of 10-15lb weight loss in the next 3 months. Patient voiced their understanding and motivation to adhere to these recommendations.         Follow up plan: Return for as schedule in July for physical.

## 2023-05-20 ENCOUNTER — Ambulatory Visit
Admission: RE | Admit: 2023-05-20 | Discharge: 2023-05-20 | Disposition: A | Discharge status: Home / Self Care | Payer: 59 | Source: Ambulatory Visit | Attending: Nurse Practitioner | Admitting: Nurse Practitioner

## 2023-05-20 DIAGNOSIS — D171 Benign lipomatous neoplasm of skin and subcutaneous tissue of trunk: Secondary | ICD-10-CM | POA: Diagnosis present

## 2023-05-21 ENCOUNTER — Encounter: Payer: Self-pay | Admitting: Nurse Practitioner

## 2023-05-21 NOTE — Progress Notes (Signed)
Contacted via MyChart   Good morning Kathleen [name], your imaging has returned and continues to show lipoma as we discussed although on review of previous imaging there is some increase in size.  If this is causing discomfort we can place a referral to general surgery to obtain recommendations.  I know you had mentioned there was a little more discomfort with movement.  Let me know if you would like to see general surgery.  Any questions? Keep being amazing!!  Thank you for allowing me to participate in your care.  I appreciate you. Kindest regards, Leanthony Rhett

## 2023-05-29 ENCOUNTER — Ambulatory Visit
Admission: EM | Admit: 2023-05-29 | Discharge: 2023-05-29 | Disposition: A | Discharge status: Home / Self Care | Payer: 59 | Attending: Family Medicine | Admitting: Family Medicine

## 2023-05-29 ENCOUNTER — Encounter: Payer: Self-pay | Admitting: Emergency Medicine

## 2023-05-29 DIAGNOSIS — J101 Influenza due to other identified influenza virus with other respiratory manifestations: Secondary | ICD-10-CM | POA: Insufficient documentation

## 2023-05-29 LAB — RESP PANEL BY RT-PCR (FLU A&B, COVID) ARPGX2
Influenza A by PCR: POSITIVE — AB
Influenza B by PCR: NEGATIVE
SARS Coronavirus 2 by RT PCR: NEGATIVE

## 2023-05-29 MED ORDER — ONDANSETRON 4 MG PO TBDP: 4.0000 mg | ORAL_TABLET | Freq: Three times a day (TID) | ORAL | 0 refills | Status: DC | PRN

## 2023-05-29 MED ORDER — OSELTAMIVIR PHOSPHATE 75 MG PO CAPS: 75.0000 mg | ORAL_CAPSULE | Freq: Two times a day (BID) | ORAL | 0 refills | Status: DC

## 2023-05-29 MED ORDER — ONDANSETRON 4 MG PO TBDP
4.0000 mg | ORAL_TABLET | Freq: Once | ORAL | Status: AC
  Administered 2023-05-29: 4 mg via ORAL

## 2023-05-29 NOTE — ED Triage Notes (Signed)
Patient c/o cough, headache, chills, and bodyaches that started 2 days ago.  Patient unsure of fevers.

## 2023-05-29 NOTE — ED Provider Notes (Signed)
[facility]    CSN: [id number] Arrival date & time: 05/29/23  1905      History   Chief Complaint Chief Complaint  Patient presents with   Headache   Cough    HPI Kathleen [name] is a 43 y.o. female.   HPI  History obtained from the patient. Kathleen [name] presents for 2 days of cough, fever, headache, chills, body aches, vomiting. No sore throat or rhinorrhea.  Tmax 100 F. Taking OTC with little relief.  People at her job have the flu.       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      Home Medications    Prior to Admission medications   Medication Sig Start Date End Date Taking? Authorizing Provider  ondansetron (ZOFRAN-ODT) 4 MG disintegrating tablet Take 1 tablet (4 mg total) by mouth every 8 (eight) hours as needed. 05/29/23  Yes Freeman Borba, [place] Meth, DO  oseltamivir (TAMIFLU) 75 MG capsule Take 1 capsule (75 mg total) by mouth every 12 (twelve) hours. 05/29/23  Yes Breyah Akhter, DO  EPINEPHrine 0.3 mg/0.3 mL IJ SOAJ injection Inject 0.3 mg into the muscle as needed.  08/04/18   [provider]  Multiple Vitamin (MULTIVITAMIN) tablet Take 1 tablet by mouth daily.    [provider]  vitamin B-12 (CYANOCOBALAMIN) 250  MCG tablet Take 250 mcg by mouth daily.    [provider]    Family History Family History  Problem Relation Age of Onset   Congestive Heart Failure Mother    Pulmonary Hypertension Mother    COPD Mother    Sickle cell anemia Mother    Irritable bowel syndrome Sister     Social History Social History   Tobacco Use   Smoking status: Never   Smokeless tobacco: Never  Vaping Use   Vaping status: Never Used  Substance Use Topics   Alcohol use: Yes    Alcohol/week: 1.0 standard drink of alcohol    Types: 1 Glasses of wine per week   Drug use: Not Currently     Allergies   Patient has no known allergies.   Review of Systems Review of Systems: negative unless otherwise stated in HPI.      Physical Exam Triage Vital Signs ED Triage Vitals  Encounter Vitals Group     BP 05/29/23 1918 (!) 154/104     Systolic BP Percentile --      Diastolic BP Percentile --      Pulse Rate 05/29/23 1918 (!) 106     Resp 05/29/23 1918 14     Temp 05/29/23 1918 99.3 F (37.4 C)     Temp Source 05/29/23 1918 Oral  SpO2 05/29/23 1918 100 %     Weight 05/29/23 1917 203 lb (92.1 kg)     Height 05/29/23 1917 5\' 7"  (1.702 m)     Head Circumference --      Peak Flow --      Pain Score 05/29/23 1917 5     Pain Loc --      Pain Education --      Exclude from Growth Chart --    No data found.  Updated Vital Signs BP (!) 154/104 (BP Location: Left Arm)   Pulse (!) 106   Temp 99.3 F (37.4 C) (Oral)   Resp 14   Ht 5\' 7"  (1.702 m)   Wt 92.1 kg   SpO2 100%   BMI 31.79 kg/m   Visual Acuity Right Eye Distance:   Left Eye Distance:   Bilateral Distance:    Right Eye Near:   Left Eye Near:    Bilateral Near:     Physical Exam GEN:     alert, non-toxic appearing female in no distress    HENT:  mucus membranes moist, oropharyngeal without lesions or erythema EYES:   no scleral injection or discharge NECK:  normal ROM, no lymphadenopathy, no meningismus   RESP:  no  increased work of breathing, clear to auscultation bilaterally CVS:   regular rhythm, tachycardic  Skin:   warm and dry    UC Treatments / Results  Labs (all labs ordered are listed, but only abnormal results are displayed) Labs Reviewed  RESP PANEL BY RT-PCR (FLU A&B, COVID) ARPGX2 - Abnormal; Notable for the following components:      Result Value   Influenza A by PCR POSITIVE (*)    All other components within normal limits    EKG   Radiology No results found.  Procedures Procedures (including critical care time)  Medications Ordered in UC Medications  ondansetron (ZOFRAN-ODT) disintegrating tablet 4 mg (4 mg Oral Given 05/29/23 2011)    Initial Impression / Assessment and Plan / UC Course  I have reviewed the triage vital signs and the nursing notes.  Pertinent labs & imaging results that were available during my care of the patient were reviewed by me and considered in my medical decision making (see chart for details).       Pt is a 43 y.o. female who presents for 2 days of respiratory symptoms. Kathleen [name] is tachycardic but afebrile here without recent antipyretics. Zofran given for nausea. Satting well on room air. Overall pt is ill but non-toxic appearing, well hydrated, without respiratory distress. Pulmonary exam is unremarkable.  COVID and influenza panel obtained and was influenza A positive.  Tamiflu risk and benefits discussed.  She would like to proceed with Tamiflu prescription.  Discussed symptomatic treatment.  Explained lack of efficacy of antibiotics in viral disease.  Typical duration of symptoms discussed. Work note provided. Zofran prescribed.   Return and ED precautions given and voiced understanding. Discussed MDM, treatment plan and plan for follow-up with patient who agrees with plan.     Final Clinical Impressions(s) / UC Diagnoses   Final diagnoses:  Influenza A with respiratory manifestations     Discharge Instructions      You have  influenza A.  Tamiflu wasprescribed.  Your symptoms will gradually improve over the next 7 to 10 days.  The cough may last about 3 weeks.   Take ibuprofen 600 mg with Tylenol 1000 mg for fever, headache or body aches.   For cough: You  can also use guaifenesin and dextromethorphan for cough. You can use a humidifier for chest congestion and cough.  If you don't have a humidifier, you can sit in the bathroom with the hot shower running.      For sore throat: try warm salt water gargles, Mucinex sore throat cough drops or cepacol lozenges, throat spray, warm tea or water with lemon/honey, popsicles or ice, or OTC cold relief medicine for throat discomfort. You can also purchase chloraseptic spray at the pharmacy or dollar store.   For congestion: take a daily anti-histamine like Zyrtec, Claritin, and a oral decongestant, such as pseudoephedrine.  You can also use Flonase 1-2 sprays in each nostril daily. Afrin is also a good option, if you do not have high blood pressure.    It is important to stay hydrated: drink plenty of fluids (water, gatorade/powerade/pedialyte, juices, or teas) to keep your throat moisturized and help further relieve irritation/discomfort.    Return or go to the Emergency Department if symptoms worsen or do not improve in the next few days      ED Prescriptions     Medication Sig Dispense Auth. Provider   oseltamivir (TAMIFLU) 75 MG capsule Take 1 capsule (75 mg total) by mouth every 12 (twelve) hours. 10 capsule Charlese Gruetzmacher, DO   ondansetron (ZOFRAN-ODT) 4 MG disintegrating tablet Take 1 tablet (4 mg total) by mouth every 8 (eight) hours as needed. 20 tablet Katha Cabal, DO      PDMP not reviewed this encounter.   Katha Cabal, DO 05/29/23 2013

## 2023-05-29 NOTE — Discharge Instructions (Signed)
 You have influenza A.  Tamiflu wasprescribed.  Your symptoms will gradually improve over the next 7 to 10 days.  The cough may last about 3 weeks.   Take ibuprofen 600 mg with Tylenol 1000 mg for fever, headache or body aches.   For cough: You can also use guaifenesin and dextromethorphan for cough. You can use a humidifier for chest congestion and cough.  If you don't have a humidifier, you can sit in the bathroom with the hot shower running.      For sore throat: try warm salt water gargles, Mucinex sore throat cough drops or cepacol lozenges, throat spray, warm tea or water with lemon/honey, popsicles or ice, or OTC cold relief medicine for throat discomfort. You can also purchase chloraseptic spray at the pharmacy or dollar store.   For congestion: take a daily anti-histamine like Zyrtec, Claritin, and a oral decongestant, such as pseudoephedrine.  You can also use Flonase 1-2 sprays in each nostril daily. Afrin is also a good option, if you do not have high blood pressure.    It is important to stay hydrated: drink plenty of fluids (water, gatorade/powerade/pedialyte, juices, or teas) to keep your throat moisturized and help further relieve irritation/discomfort.    Return or go to the Emergency Department if symptoms worsen or do not improve in the next few days

## 2023-08-06 ENCOUNTER — Encounter: Payer: Self-pay | Admitting: Oncology

## 2023-08-15 NOTE — Patient Instructions (Incomplete)
Be Involved in Caring For Your Health:  Taking Medications When medications are taken as directed, they can greatly improve your health. But if they are not taken as prescribed, they may not work. In some cases, not taking them correctly can be harmful. To help ensure your treatment remains effective and safe, understand your medications and how to take them. Bring your medications to each visit for review by your provider.  Your lab results, notes, and after visit summary will be available on My Chart. We strongly encourage you to use this feature. If lab results are abnormal the clinic will contact you with the appropriate steps. If the clinic does not contact you assume the results are satisfactory. You can always view your results on My Chart. If you have questions regarding your health or results, please contact the clinic during office hours. You can also ask questions on My Chart.  We at [facility] are grateful that you chose [place] to provide your care. We strive to provide evidence-based and compassionate care and are always looking for feedback. If you get a survey from the clinic please complete this so we can hear your opinions.  Back Exercises The following exercises strengthen the muscles that help to support the trunk (torso) and back. They also help to keep the lower back flexible. Doing these exercises can help to prevent or lessen existing low back pain. If you have back pain or discomfort, try doing these exercises 2-3 times each day or as told by your health care provider. As your pain improves, do them once each day, but increase the number of times that you repeat the steps for each exercise (do more repetitions). To prevent the recurrence of back pain, continue to do these exercises once each day or as told by your health care provider. Do exercises exactly as told by your health care provider and adjust them as directed. It is normal to feel mild stretching, pulling,  tightness, or discomfort as you do these exercises, but you should stop right away if you feel sudden pain or your pain gets worse. Exercises Single knee to chest Repeat these steps 3-5 times for each leg: Lie on your back on a firm bed or the floor with your legs extended. Bring one knee to your chest. Your other leg should stay extended and in contact with the floor. Hold your knee in place by grabbing your knee or thigh with both hands and hold. Pull on your knee until you feel a gentle stretch in your lower back or buttocks. Hold the stretch for 10-30 seconds. Slowly release and straighten your leg.  Pelvic tilt Repeat these steps 5-10 times: Lie on your back on a firm bed or the floor with your legs extended. Bend your knees so they are pointing toward the ceiling and your feet are flat on the floor. Tighten your lower abdominal muscles to press your lower back against the floor. This motion will tilt your pelvis so your tailbone points up toward the ceiling instead of pointing to your feet or the floor. With gentle tension and even breathing, hold this position for 5-10 seconds.  Cat-cow Repeat these steps until your lower back becomes more flexible: Get into a hands-and-knees position on a firm bed or the floor. Keep your hands under your shoulders, and keep your knees under your hips. You may place padding under your knees for comfort. Let your head hang down toward your chest. Contract your abdominal muscles and point  your tailbone toward the floor so your lower back becomes rounded like the back of a cat. Hold this position for 5 seconds. Slowly lift your head, let your abdominal muscles relax, and point your tailbone up toward the ceiling so your back forms a sagging arch like the back of a cow. Hold this position for 5 seconds.  Press-ups Repeat these steps 5-10 times: Lie on your abdomen (face-down) on a firm bed or the floor. Place your palms near your head, about  shoulder-width apart. Keeping your back as relaxed as possible and keeping your hips on the floor, slowly straighten your arms to raise the top half of your body and lift your shoulders. Do not use your back muscles to raise your upper torso. You may adjust the placement of your hands to make yourself more comfortable. Hold this position for 5 seconds while you keep your back relaxed. Slowly return to lying flat on the floor.  Bridges Repeat these steps 10 times: Lie on your back on a firm bed or the floor. Bend your knees so they are pointing toward the ceiling and your feet are flat on the floor. Your arms should be flat at your sides, next to your body. Tighten your buttocks muscles and lift your buttocks off the floor until your waist is at almost the same height as your knees. You should feel the muscles working in your buttocks and the back of your thighs. If you do not feel these muscles, slide your feet 1-2 inches (2.5-5 cm) farther away from your buttocks. Hold this position for 3-5 seconds. Slowly lower your hips to the starting position, and allow your buttocks muscles to relax completely. If this exercise is too easy, try doing it with your arms crossed over your chest. Abdominal crunches Repeat these steps 5-10 times: Lie on your back on a firm bed or the floor with your legs extended. Bend your knees so they are pointing toward the ceiling and your feet are flat on the floor. Cross your arms over your chest. Tip your chin slightly toward your chest without bending your neck. Tighten your abdominal muscles and slowly raise your torso high enough to lift your shoulder blades a tiny bit off the floor. Avoid raising your torso higher than that because it can put too much stress on your lower back and does not help to strengthen your abdominal muscles. Slowly return to your starting position.  Back lifts Repeat these steps 5-10 times: Lie on your abdomen (face-down) with your arms at  your sides, and rest your forehead on the floor. Tighten the muscles in your legs and your buttocks. Slowly lift your chest off the floor while you keep your hips pressed to the floor. Keep the back of your head in line with the curve in your back. Your eyes should be looking at the floor. Hold this position for 3-5 seconds. Slowly return to your starting position.  Contact a health care provider if: Your back pain or discomfort gets much worse when you do an exercise. Your worsening back pain or discomfort does not lessen within 2 hours after you exercise. If you have any of these problems, stop doing these exercises right away. Do not do them again unless your health care provider says that you can. Get help right away if: You develop sudden, severe back pain. If this happens, stop doing the exercises right away. Do not do them again unless your health care provider says that you can. This  information is not intended to replace advice given to you by your health care provider. Make sure you discuss any questions you have with your health care provider. Document Revised: 04/27/2022 Document Reviewed: 06/06/2020 Elsevier Patient Education  2024 [employer].

## 2023-08-19 ENCOUNTER — Ambulatory Visit: Payer: Self-pay | Admitting: Nurse Practitioner

## 2023-08-19 DIAGNOSIS — M545 Low back pain, unspecified: Secondary | ICD-10-CM

## 2023-10-17 NOTE — Patient Instructions (Incomplete)
 Be Involved in Caring For Your Health:  Taking Medications When medications are taken as directed, they can greatly improve your health. But if they are not taken as prescribed, they may not work. In some cases, not taking them correctly can be harmful. To help ensure your treatment remains effective and safe, understand your medications and how to take them. Bring your medications to each visit for review by your provider.  Your lab results, notes, and after visit summary will be available on My Chart. We strongly encourage you to use this feature. If lab results are abnormal the clinic will contact you with the appropriate steps. If the clinic does not contact you assume the results are satisfactory. You can always view your results on My Chart. If you have questions regarding your health or results, please contact the clinic during office hours. You can also ask questions on My Chart.  We at [facility] are grateful that you chose [place] to provide your care. We strive to provide evidence-based and compassionate care and are always looking for feedback. If you get a survey from the clinic please complete this so we can hear your opinions.  Healthy Eating, Adult Healthy eating may help you get and keep a healthy body weight, reduce the risk of chronic disease, and live a long and productive life. It is important to follow a healthy eating pattern. Your nutritional and calorie needs should be met mainly by different nutrient-rich foods. What are tips for following this plan? Reading food labels Read labels and choose the following: Reduced or low sodium products. Juices with 100% fruit juice. Foods with low saturated fats (<3 g per serving) and high polyunsaturated and monounsaturated fats. Foods with whole grains, such as whole wheat, cracked wheat, brown rice, and wild rice. Whole grains that are fortified with folic acid. This is recommended for females who are pregnant or who want  to become pregnant. Read labels and do not eat or drink the following: Foods or drinks with added sugars. These include foods that contain brown sugar, corn sweetener, corn syrup, dextrose, fructose, glucose, high-fructose corn syrup, honey, invert sugar, lactose, malt syrup, maltose, molasses, raw sugar, sucrose, trehalose, or turbinado sugar. Limit your intake of added sugars to less than 10% of your total daily calories. Do not eat more than the following amounts of added sugar per day: 6 teaspoons (25 g) for females. 9 teaspoons (38 g) for males. Foods that contain processed or refined starches and grains. Refined grain products, such as white flour, degermed cornmeal, white bread, and white rice. Shopping Choose nutrient-rich snacks, such as vegetables, whole fruits, and nuts. Avoid high-calorie and high-sugar snacks, such as potato chips, fruit snacks, and candy. Use oil-based dressings and spreads on foods instead of solid fats such as butter, margarine, sour cream, or cream cheese. Limit pre-made sauces, mixes, and "instant" products such as flavored rice, instant noodles, and ready-made pasta. Try more plant-protein sources, such as tofu, tempeh, black beans, edamame, lentils, nuts, and seeds. Explore eating plans such as the Mediterranean diet or vegetarian diet. Try heart-healthy dips made with beans and healthy fats like hummus and guacamole. Vegetables go great with these. Cooking Use oil to saut or stir-fry foods instead of solid fats such as butter, margarine, or lard. Try baking, boiling, grilling, or broiling instead of frying. Remove the fatty part of meats before cooking. Steam vegetables in water or broth. Meal planning  At meals, imagine dividing your plate into fourths: One-half of  your plate is fruits and vegetables. One-fourth of your plate is whole grains. One-fourth of your plate is protein, especially lean meats, poultry, eggs, tofu, beans, or nuts. Include  low-fat dairy as part of your daily diet. Lifestyle Choose healthy options in all settings, including home, work, school, restaurants, or stores. Prepare your food safely: Wash your hands after handling raw meats. Where you prepare food, keep surfaces clean by regularly washing with hot, soapy water. Keep raw meats separate from ready-to-eat foods, such as fruits and vegetables. Cook seafood, meat, poultry, and eggs to the recommended temperature. Get a food thermometer. Store foods at safe temperatures. In general: Keep cold foods at 76F (4.4C) or below. Keep hot foods at 176F (60C) or above. Keep your freezer at [facility] (-17.8C) or below. Foods are not safe to eat if they have been between the temperatures of 40-176F (4.4-60C) for more than 2 hours. What foods should I eat? Fruits Aim to eat 1-2 cups of fresh, canned (in natural juice), or frozen fruits each day. One cup of fruit equals 1 small apple, 1 large banana, 8 large strawberries, 1 cup (237 g) canned fruit,  cup (82 g) dried fruit, or 1 cup (240 mL) 100% juice. Vegetables Aim to eat 2-4 cups of fresh and frozen vegetables each day, including different varieties and colors. One cup of vegetables equals 1 cup (91 g) broccoli or cauliflower florets, 2 medium carrots, 2 cups (150 g) raw, leafy greens, 1 large tomato, 1 large bell pepper, 1 large sweet potato, or 1 medium white potato. Grains Aim to eat 5-10 ounce-equivalents of whole grains each day. Examples of 1 ounce-equivalent of grains include 1 slice of bread, 1 cup (40 g) ready-to-eat cereal, 3 cups (24 g) popcorn, or  cup (93 g) cooked rice. Meats and other proteins Try to eat 5-7 ounce-equivalents of protein each day. Examples of 1 ounce-equivalent of protein include 1 egg,  oz nuts (12 almonds, 24 pistachios, or 7 walnut halves), 1/4 cup (90 g) cooked beans, 6 tablespoons (90 g) hummus or 1 tablespoon (16 g) peanut butter. A cut of meat or fish that is the size of a deck  of cards is about 3-4 ounce-equivalents (85 g). Of the protein you eat each week, try to have at least 8 sounce (227 g) of seafood. This is about 2 servings per week. This includes salmon, trout, herring, sardines, and anchovies. Dairy Aim to eat 3 cup-equivalents of fat-free or low-fat dairy each day. Examples of 1 cup-equivalent of dairy include 1 cup (240 mL) milk, 8 ounces (250 g) yogurt, 1 ounces (44 g) natural cheese, or 1 cup (240 mL) fortified soy milk. Fats and oils Aim for about 5 teaspoons (21 g) of fats and oils per day. Choose monounsaturated fats, such as canola and olive oils, mayonnaise made with olive oil or avocado oil, avocados, peanut butter, and most nuts, or polyunsaturated fats, such as sunflower, corn, and soybean oils, walnuts, pine nuts, sesame seeds, sunflower seeds, and flaxseed. Beverages Aim for 6 eight-ounce glasses of water per day. Limit coffee to 3-5 eight-ounce cups per day. Limit caffeinated beverages that have added calories, such as soda and energy drinks. If you drink alcohol: Limit how much you have to: 0-1 drink a day if you are female. 0-2 drinks a day if you are female. Know how much alcohol is in your drink. In the [place] one drink is one 12 oz bottle of beer (355 mL), one 5 oz glass of wine (  148 mL), or one 1 oz glass of hard liquor (44 mL). Seasoning and other foods Try not to add too much salt to your food. Try using herbs and spices instead of salt. Try not to add sugar to food. This information is based on [place] nutrition guidelines. To learn more, visit [employer]. Exact amounts may vary. You may need different amounts. This information is not intended to replace advice given to you by your health care provider. Make sure you discuss any questions you have with your health care provider. Document Revised: 12/23/2021 Document Reviewed: 12/23/2021 Elsevier Patient Education  2024 [employer].

## 2023-10-20 ENCOUNTER — Encounter: Payer: Self-pay | Admitting: Oncology

## 2023-10-20 ENCOUNTER — Ambulatory Visit (INDEPENDENT_AMBULATORY_CARE_PROVIDER_SITE_OTHER): Payer: Self-pay | Admitting: Nurse Practitioner

## 2023-10-20 VITALS — BP 122/80 | HR 73 | Temp 97.9°F | Ht 68.1 in | Wt 217.4 lb

## 2023-10-20 DIAGNOSIS — D508 Other iron deficiency anemias: Secondary | ICD-10-CM | POA: Diagnosis not present

## 2023-10-20 DIAGNOSIS — E559 Vitamin D deficiency, unspecified: Secondary | ICD-10-CM

## 2023-10-20 DIAGNOSIS — R768 Other specified abnormal immunological findings in serum: Secondary | ICD-10-CM

## 2023-10-20 DIAGNOSIS — Z1231 Encounter for screening mammogram for malignant neoplasm of breast: Secondary | ICD-10-CM

## 2023-10-20 DIAGNOSIS — Z0000 Encounter for general adult medical examination without abnormal findings: Secondary | ICD-10-CM | POA: Diagnosis not present

## 2023-10-20 DIAGNOSIS — D573 Sickle-cell trait: Secondary | ICD-10-CM

## 2023-10-20 DIAGNOSIS — I10 Essential (primary) hypertension: Secondary | ICD-10-CM

## 2023-10-20 DIAGNOSIS — E66811 Obesity, class 1: Secondary | ICD-10-CM

## 2023-10-20 DIAGNOSIS — R7989 Other specified abnormal findings of blood chemistry: Secondary | ICD-10-CM

## 2023-10-20 DIAGNOSIS — M2550 Pain in unspecified joint: Secondary | ICD-10-CM

## 2023-10-20 DIAGNOSIS — Z113 Encounter for screening for infections with a predominantly sexual mode of transmission: Secondary | ICD-10-CM

## 2023-10-20 NOTE — Progress Notes (Signed)
 BP 122/80 (BP Location: Left Arm, Patient Position: Sitting, Cuff Size: Normal)   Pulse 73   Temp 97.9 F (36.6 C) (Oral)   Ht 5' 8.1 (1.73 m)   Wt 217 lb 6.4 oz (98.6 kg)   LMP 10/15/2023 (Exact Date)   SpO2 98%   BMI 32.96 kg/m    Subjective:    Patient ID: Kathleen [name], female    DOB: [birth year], 43 y.o.   MRN: [id number]  HPI: Kathleen [name] is a 43 y.o. female presenting on 10/20/2023 for comprehensive medical examination. Current medical complaints include:none  She currently lives with: kids Menopausal Symptoms: no   Reports intermittent periods of discomfort to lower left back, none to right side.  This is intermittent in nature.  Has been present for a little while. Can be while sitting or having a BM.  Rates discomfort as 5-6/10, uncomfortable.  Nothing makes it worse.  Ibuprofen makes it better. Does have uterine fibroid 4.9 cm on imaging 11/18/22.  Also notices her right shoulder pops a lot, only the right side, feels like things need to be realigned.  Would like STD screening.  HYPERTENSION No medications at this time -- in past took Lisinopril .  Has been stable without medication. 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 Hypertension status: stable  Satisfied with current treatment? yes Duration of hypertension: chronic BP monitoring frequency: not checking BP range: none BP medication side effects:  no Medication compliance: good compliance Aspirin: no Recurrent headaches: no Visual changes: no Palpitations: no Dyspnea: no Chest pain: no Lower extremity edema: on occasion Dizzy/lightheaded: no  ANEMIA Sickle Cell Trait, her mother had Sickle Cell.  Did follow with hematology, last visit was 09/30/22.  Did obtain infusions at one time.  Has heavier cycles and follows GYN Anemia status: stable Etiology of anemia: menorrhagia Duration of anemia treatment: long term Compliance with treatment: good  compliance Iron  supplementation side effects: no Severity of anemia: moderate Fatigue: at baseline Decreased exercise tolerance: no  Dyspnea on exertion: no Palpitations: no Bleeding: no Pica: no   Functional Status Survey: Is the patient deaf or have difficulty hearing?: No Does the patient have difficulty seeing, even when wearing glasses/contacts?: No Does the patient have difficulty concentrating, remembering, or making decisions?: No Does the patient have difficulty walking or climbing stairs?: No Does the patient have difficulty dressing or bathing?: No Does the patient have difficulty doing errands alone such as visiting a doctor's office or shopping?: No   Depression Screen done today and results listed below:     10/20/2023    8:16 AM 05/19/2023    8:32 AM 10/17/2022    8:58 AM 06/03/2022    8:34 AM 12/05/2021    8:16 AM  Depression screen PHQ 2/9  Decreased Interest 0 0 0 0 0  Down, Depressed, Hopeless 0 0 0 0 0  PHQ - 2 Score 0 0 0 0 0  Altered sleeping 1 0 1 0 0  Tired, decreased energy 1 2 1  0 0  Change in appetite 0 0 0 0 0  Feeling bad or failure about yourself  0 0 0 0 0  Trouble concentrating 0 0 0 0 0  Moving slowly or fidgety/restless 0 0 0 0 0  Suicidal thoughts 0 0 0 0 0  PHQ-9 Score 2 2 2  0 0  Difficult doing work/chores Not difficult at all Not difficult at all Not difficult at all Not  difficult at all Not difficult at all      10/20/2023    8:17 AM 05/19/2023    8:32 AM 10/17/2022    8:58 AM 06/03/2022    8:34 AM  GAD 7 : Generalized Anxiety Score  Nervous, Anxious, on Edge 0 0 0 0  Control/stop worrying 0 0 0 0  Worry too much - different things 0 0 0 0  Trouble relaxing 0 0 0 0  Restless 0 0 0 0  Easily annoyed or irritable 0 0 0 0  Afraid - awful might happen 0 0 0 0  Total GAD 7 Score 0 0 0 0  Anxiety Difficulty Not difficult at all Not difficult at all Not difficult at all Not difficult at all       03/25/2022   12:00 PM 06/03/2022     8:33 AM 10/17/2022    8:57 AM 05/19/2023    8:30 AM 10/20/2023    8:16 AM  Fall Risk  Falls in the past year?  0 0 0 0  Was there an injury with Fall?  0 0 0 0  Fall Risk Category Calculator  0 0 0 0  (RETIRED) Patient Fall Risk Level Low fall risk       Patient at Risk for Falls Due to  No Fall Risks No Fall Risks No Fall Risks No Fall Risks  Fall risk Follow up  Falls evaluation completed Falls evaluation completed Falls evaluation completed Falls evaluation completed     Data saved with a previous flowsheet row definition     Past Medical History:  Past Medical History:  Diagnosis Date   Allergy    Anemia     Surgical History:  Past Surgical History:  Procedure Laterality Date   STENT PLACEMENT VASCULAR ([facility] HX)     TUBAL LIGATION      Medications:  Current Outpatient Medications on File Prior to Visit  Medication Sig   EPINEPHrine  0.3 mg/0.3 mL IJ SOAJ injection Inject 0.3 mg into the muscle as needed.    Multiple Vitamin (MULTIVITAMIN) tablet Take 1 tablet by mouth daily.   vitamin B-12 (CYANOCOBALAMIN) 250 MCG tablet Take 250 mcg by mouth daily.   No current facility-administered medications on file prior to visit.    Allergies:  No Known Allergies  Social History:  Social History   Socioeconomic History   Marital status: Single    Spouse name: Not on file   Number of children: Not on file   Years of education: Not on file   Highest education level: Some college, no degree  Occupational History   Not on file  Tobacco Use   Smoking status: Never   Smokeless tobacco: Never  Vaping Use   Vaping status: Never Used  Substance and Sexual Activity   Alcohol use: Yes    Alcohol/week: 3.0 standard drinks of alcohol    Types: 1 Glasses of wine, 2 Shots of liquor per week   Drug use: Not Currently    Types: Marijuana   Sexual activity: Yes    Birth control/protection: Other-see comments    Comment: Tubal ligation  Other Topics Concern   Not on file   Social History Narrative   Not on file   Social Drivers of Health   Financial Resource Strain: Low Risk  (10/20/2023)   Overall Financial Resource Strain (CARDIA)    Difficulty of Paying Living Expenses: Not hard at all  Food Insecurity: No Food Insecurity (10/20/2023)   Hunger Vital  Sign    Worried About Programme researcher, broadcasting/film/video in the Last Year: Never true    Ran Out of Food in the Last Year: Never true  Transportation Needs: No Transportation Needs (10/20/2023)   PRAPARE - Administrator, Civil Service (Medical): No    Lack of Transportation (Non-Medical): No  Physical Activity: Insufficiently Active (10/20/2023)   Exercise Vital Sign    Days of Exercise per Week: 3 days    Minutes of Exercise per Session: 30 min  Stress: No Stress Concern Present (10/20/2023)   [employer] of Occupational Health - Occupational Stress Questionnaire    Feeling of Stress: Only a little  Social Connections: Socially Isolated (10/20/2023)   Social Connection and Isolation Panel    Frequency of Communication with Friends and Family: Once a week    Frequency of Social Gatherings with Friends and Family: Once a week    Attends Religious Services: More than 4 times per year    Active Member of [employer] or Organizations: No    Attends Engineer, structural: Not on file    Marital Status: Never married  Intimate Partner Violence: Not At Risk (10/20/2023)   Humiliation, Afraid, Rape, and Kick questionnaire    Fear of Current or Ex-Partner: No    Emotionally Abused: No    Physically Abused: No    Sexually Abused: No   Social History   Tobacco Use  Smoking Status Never  Smokeless Tobacco Never   Social History   Substance and Sexual Activity  Alcohol Use Yes   Alcohol/week: 3.0 standard drinks of alcohol   Types: 1 Glasses of wine, 2 Shots of liquor per week    Family History:  Family History  Problem Relation Age of Onset   Congestive Heart Failure Mother    Pulmonary  Hypertension Mother    COPD Mother    Sickle cell anemia Mother    Hypertension Mother    Irritable bowel syndrome Sister    Alcohol abuse Maternal Grandmother     Past medical history, surgical history, medications, allergies, family history and social history reviewed with patient today and changes made to appropriate areas of the chart.   Review of Systems - negative All other ROS negative except what is listed above and in the HPI.      Objective:    BP 122/80 (BP Location: Left Arm, Patient Position: Sitting, Cuff Size: Normal)   Pulse 73   Temp 97.9 F (36.6 C) (Oral)   Ht 5' 8.1 (1.73 m)   Wt 217 lb 6.4 oz (98.6 kg)   LMP 10/15/2023 (Exact Date)   SpO2 98%   BMI 32.96 kg/m   Wt Readings from Last 3 Encounters:  10/20/23 217 lb 6.4 oz (98.6 kg)  05/29/23 203 lb (92.1 kg)  05/19/23 216 lb 6.4 oz (98.2 kg)    Physical Exam Vitals and nursing note reviewed. Exam conducted with a chaperone present.  Constitutional:      General: She is awake. She is not in acute distress.    Appearance: She is well-developed and well-groomed. She is not ill-appearing or toxic-appearing.  HENT:     Head: Normocephalic and atraumatic.     Right Ear: Hearing, tympanic membrane, ear canal and external ear normal. No drainage.     Left Ear: Hearing, tympanic membrane, ear canal and external ear normal. No drainage.     Nose: Nose normal.     Right Sinus: No maxillary sinus tenderness or  frontal sinus tenderness.     Left Sinus: No maxillary sinus tenderness or frontal sinus tenderness.     Mouth/Throat:     Mouth: Mucous membranes are moist.     Pharynx: Oropharynx is clear. Uvula midline. No pharyngeal swelling, oropharyngeal exudate or posterior oropharyngeal erythema.  Eyes:     General: Lids are normal.        Right eye: No discharge.        Left eye: No discharge.     Extraocular Movements: Extraocular movements intact.     Conjunctiva/sclera: Conjunctivae normal.     Pupils:  Pupils are equal, round, and reactive to light.     Visual Fields: Right eye visual fields normal and left eye visual fields normal.  Neck:     Thyroid : No thyromegaly.     Vascular: No carotid bruit.     Trachea: Trachea normal.  Cardiovascular:     Rate and Rhythm: Normal rate and regular rhythm.     Heart sounds: Normal heart sounds. No murmur heard.    No gallop.  Pulmonary:     Effort: Pulmonary effort is normal. No accessory muscle usage or respiratory distress.     Breath sounds: Normal breath sounds.  Chest:  Breasts:    Right: Normal.     Left: Normal.  Abdominal:     General: Bowel sounds are normal.     Palpations: Abdomen is soft. There is no hepatomegaly or splenomegaly.     Tenderness: There is no abdominal tenderness.  Musculoskeletal:        General: Normal range of motion.     Cervical back: Normal range of motion and neck supple.     Right lower leg: No edema.     Left lower leg: No edema.  Lymphadenopathy:     Head:     Right side of head: No submental, submandibular, tonsillar, preauricular or posterior auricular adenopathy.     Left side of head: No submental, submandibular, tonsillar, preauricular or posterior auricular adenopathy.     Cervical: No cervical adenopathy.     Upper Body:     Right upper body: No supraclavicular, axillary or pectoral adenopathy.     Left upper body: No supraclavicular, axillary or pectoral adenopathy.  Skin:    General: Skin is warm and dry.     Capillary Refill: Capillary refill takes less than 2 seconds.     Findings: No rash.  Neurological:     Mental Status: She is alert and oriented to person, place, and time.     Gait: Gait is intact.     Deep Tendon Reflexes: Reflexes are normal and symmetric.     Reflex Scores:      Brachioradialis reflexes are 2+ on the right side and 2+ on the left side.      Patellar reflexes are 2+ on the right side and 2+ on the left side. Psychiatric:        Attention and Perception:  Attention normal.        Mood and Affect: Mood normal.        Speech: Speech normal.        Behavior: Behavior normal. Behavior is cooperative.        Thought Content: Thought content normal.        Judgment: Judgment normal.    Results for orders placed or performed during the hospital encounter of 05/29/23  Resp Panel by RT-PCR (Flu A&B, Covid) Anterior Nasal Swab   Collection  Time: 05/29/23  7:19 PM   Specimen: Anterior Nasal Swab  Result Value Ref Range   SARS Coronavirus 2 by RT PCR NEGATIVE NEGATIVE   Influenza A by PCR POSITIVE (A) NEGATIVE   Influenza B by PCR NEGATIVE NEGATIVE      Assessment & Plan:   Problem List Items Addressed This Visit       Cardiovascular and Mediastinum   Essential hypertension   Chronic, stable.  BP on recheck at goal. Recommend she monitor BP at least a few mornings a week at home and document.  DASH diet at home.  Will continue diet focus at this time. Labs today: CBC, CMP, TSH.       Relevant Orders   Comprehensive metabolic panel with GFR   Lipid Panel w/o Chol/HDL Ratio     Other   Vitamin D  deficiency   History of low levels, recheck today and continue supplements.      Relevant Orders   VITAMIN D  25 Hydroxy (Vit-D Deficiency, Fractures)   Sickle cell trait (HCC) - Primary   Followed by hematology, continue this collaboration as needed.  Discussed with patient today.      Obesity (BMI 30.0-34.9)   BMI 32.96. Recommended eating smaller high protein, low fat meals more frequently and exercising 30 mins a day 5 times a week with a goal of 10-15lb weight loss in the next 3 months. Patient voiced their understanding and motivation to adhere to these recommendations.       Low TSH level   Check TSH and Free T4 today and initiate medication or refer to endocrinology as needed.      Relevant Orders   TSH   T4, free   Iron  deficiency anemia   Chronic, stable.  Followed by hematology in past.  Recent notes reviewed and labs.  Recheck labs today.  Has heavier cycles and is going to reach out to her GYN about hysterectomy.      Relevant Orders   CBC with Differential/Platelet   Ferritin   Iron    HSV-2 seropositive   Noted past labs, treat as needed.  No recent outbreaks. Labs today per request.      Relevant Orders   HSV 1 and 2 Ab, IgG   Other Visit Diagnoses       Multiple joint pain       To left lower back and right shoulder, discussed possibility of OMM with Dr. Vicci in office which she is interested in.     Screen for STD (sexually transmitted disease)       Per patient request, obtained today.   Relevant Orders   GC/Chlamydia Probe Amp   HIV Antibody (routine testing w rflx)   RPR     Encounter for screening mammogram for malignant neoplasm of breast       Mammogram ordered.   Relevant Orders   MM 3D SCREENING MAMMOGRAM BILATERAL BREAST     Encounter for annual physical exam       Annual physical today with labs and health maintenance reviewed, discussed with patient.        Follow up plan: Return in about 1 year (around 10/19/2024) for Annual Physical.   LABORATORY TESTING:  - Pap smear: Up To Date -- due next year  IMMUNIZATIONS:   - Tdap: Tetanus vaccination status reviewed: last tetanus booster within 10 years. - Influenza: Up to date - Pneumovax: Not applicable - Prevnar: Not applicable - HPV: Not applicable - Zostavax vaccine: Not applicable  -  Covid == has had x 2  SCREENING: -Mammogram: Ordered Today - Colonoscopy: Not applicable  - Bone Density: Not applicable  -Hearing Test: Not applicable  -Spirometry: Not applicable   PATIENT COUNSELING:   Advised to take 1 mg of folate supplement per day if capable of pregnancy.   Sexuality: Discussed sexually transmitted diseases, partner selection, use of condoms, avoidance of unintended pregnancy  and contraceptive alternatives.   Advised to avoid cigarette smoking.  I discussed with the patient that most people either  abstain from alcohol or drink within safe limits (<=14/week and <=4 drinks/occasion for males, <=7/weeks and <= 3 drinks/occasion for females) and that the risk for alcohol disorders and other health effects rises proportionally with the number of drinks per week and how often a drinker exceeds daily limits.  Discussed cessation/primary prevention of drug use and availability of treatment for abuse.   Diet: Encouraged to adjust caloric intake to maintain  or achieve ideal body weight, to reduce intake of dietary saturated fat and total fat, to limit sodium intake by avoiding high sodium foods and not adding table salt, and to maintain adequate dietary potassium and calcium preferably from fresh fruits, vegetables, and low-fat dairy products.    Stressed the importance of regular exercise  Injury prevention: Discussed safety belts, safety helmets, smoke detector, smoking near bedding or upholstery.   Dental health: Discussed importance of regular tooth brushing, flossing, and dental visits.    NEXT PREVENTATIVE PHYSICAL DUE IN 1 YEAR. Return in about 1 year (around 10/19/2024) for Annual Physical.

## 2023-10-20 NOTE — Assessment & Plan Note (Signed)
 Noted past labs, treat as needed.  No recent outbreaks. Labs today per request.

## 2023-10-20 NOTE — Assessment & Plan Note (Addendum)
 She is a sickle cell trait patient, continue collaboration with hematology as needed.

## 2023-10-20 NOTE — Assessment & Plan Note (Signed)
 Followed by hematology, continue this collaboration as needed.  Discussed with patient today.

## 2023-10-20 NOTE — Assessment & Plan Note (Signed)
Chronic, stable.  BP on recheck at goal. Recommend she monitor BP at least a few mornings a week at home and document.  DASH diet at home.  Will continue diet focus at this time. Labs today: CBC, CMP, TSH.

## 2023-10-20 NOTE — Assessment & Plan Note (Signed)
Check TSH and Free T4 today and initiate medication or refer to endocrinology as needed.

## 2023-10-20 NOTE — Assessment & Plan Note (Signed)
BMI 32.96.  Recommended eating smaller high protein, low fat meals more frequently and exercising 30 mins a day 5 times a week with a goal of 10-15lb weight loss in the next 3 months. Patient voiced their understanding and motivation to adhere to these recommendations. ? ?

## 2023-10-20 NOTE — Assessment & Plan Note (Signed)
 Chronic, stable.  Followed by hematology in past.  Recent notes reviewed and labs. Recheck labs today.  Has heavier cycles and is going to reach out to her GYN about hysterectomy.

## 2023-10-20 NOTE — Assessment & Plan Note (Signed)
History of low levels, recheck today and continue supplements. 

## 2023-10-21 ENCOUNTER — Ambulatory Visit: Payer: Self-pay | Admitting: Nurse Practitioner

## 2023-10-21 DIAGNOSIS — R7989 Other specified abnormal findings of blood chemistry: Secondary | ICD-10-CM

## 2023-10-21 LAB — CBC WITH DIFFERENTIAL/PLATELET
Basophils Absolute: 0 x10E3/uL (ref 0.0–0.2)
Basos: 1 %
EOS (ABSOLUTE): 0.1 x10E3/uL (ref 0.0–0.4)
Eos: 2 %
Hematocrit: 39.4 % (ref 34.0–46.6)
Hemoglobin: 12.4 g/dL (ref 11.1–15.9)
Immature Grans (Abs): 0 x10E3/uL (ref 0.0–0.1)
Immature Granulocytes: 0 %
Lymphocytes Absolute: 1.5 x10E3/uL (ref 0.7–3.1)
Lymphs: 30 %
MCH: 28.9 pg (ref 26.6–33.0)
MCHC: 31.5 g/dL (ref 31.5–35.7)
MCV: 92 fL (ref 79–97)
Monocytes Absolute: 0.4 x10E3/uL (ref 0.1–0.9)
Monocytes: 7 %
Neutrophils Absolute: 2.9 x10E3/uL (ref 1.4–7.0)
Neutrophils: 60 %
Platelets: 450 x10E3/uL (ref 150–450)
RBC: 4.29 x10E6/uL (ref 3.77–5.28)
RDW: 13.7 % (ref 11.7–15.4)
WBC: 4.9 x10E3/uL (ref 3.4–10.8)

## 2023-10-21 LAB — FERRITIN: Ferritin: 22 ng/mL (ref 15–150)

## 2023-10-21 LAB — IRON: Iron: 31 ug/dL (ref 27–159)

## 2023-10-21 LAB — LIPID PANEL W/O CHOL/HDL RATIO
Cholesterol, Total: 156 mg/dL (ref 100–199)
HDL: 62 mg/dL (ref >39)
LDL Chol Calc (NIH): 84 mg/dL (ref 0–99)
Triglycerides: 49 mg/dL (ref 0–149)
VLDL Cholesterol Cal: 10 mg/dL (ref 5–40)

## 2023-10-21 LAB — HSV 1 AND 2 AB, IGG
HSV 1 Glycoprotein G Ab, IgG: REACTIVE — AB
HSV 2 IgG, Type Spec: REACTIVE — AB

## 2023-10-21 LAB — RPR: RPR Ser Ql: NONREACTIVE

## 2023-10-21 LAB — HIV ANTIBODY (ROUTINE TESTING W REFLEX): HIV Screen 4th Generation wRfx: NONREACTIVE

## 2023-10-21 LAB — T4, FREE: Free T4: 1.27 ng/dL (ref 0.82–1.77)

## 2023-10-21 LAB — VITAMIN D 25 HYDROXY (VIT D DEFICIENCY, FRACTURES): Vit D, 25-Hydroxy: 33.2 ng/mL (ref 30.0–100.0)

## 2023-10-21 LAB — TSH: TSH: 0.266 u[IU]/mL — ABNORMAL LOW (ref 0.450–4.500)

## 2023-10-22 LAB — GC/CHLAMYDIA PROBE AMP
Chlamydia trachomatis, NAA: NEGATIVE
Neisseria Gonorrhoeae by PCR: NEGATIVE

## 2023-10-22 LAB — COMPREHENSIVE METABOLIC PANEL WITH GFR
ALT: 6 IU/L (ref 0–32)
AST: 9 IU/L (ref 0–40)
Albumin: 4.4 g/dL (ref 3.9–4.9)
Alkaline Phosphatase: 62 IU/L (ref 44–121)
BUN/Creatinine Ratio: 13 (ref 9–23)
BUN: 11 mg/dL (ref 6–24)
Bilirubin Total: 0.4 mg/dL (ref 0.0–1.2)
CO2: 20 mmol/L (ref 20–29)
Calcium: 9.1 mg/dL (ref 8.7–10.2)
Chloride: 103 mmol/L (ref 96–106)
Creatinine, Ser: 0.86 mg/dL (ref 0.57–1.00)
Globulin, Total: 3 g/dL (ref 1.5–4.5)
Glucose: 93 mg/dL (ref 70–99)
Potassium: 4.2 mmol/L (ref 3.5–5.2)
Sodium: 140 mmol/L (ref 134–144)
Total Protein: 7.4 g/dL (ref 6.0–8.5)
eGFR: 87 mL/min/1.73 (ref >59)

## 2023-10-22 NOTE — Progress Notes (Signed)
Contacted via MyChart   Urine testing all negative:)

## 2023-11-10 ENCOUNTER — Encounter

## 2023-11-11 ENCOUNTER — Ambulatory Visit
Admission: RE | Admit: 2023-11-11 | Discharge: 2023-11-11 | Disposition: A | Discharge status: Home / Self Care | Source: Ambulatory Visit | Attending: Nurse Practitioner | Admitting: Nurse Practitioner

## 2023-11-11 DIAGNOSIS — Z1231 Encounter for screening mammogram for malignant neoplasm of breast: Secondary | ICD-10-CM | POA: Insufficient documentation

## 2023-11-13 NOTE — Progress Notes (Signed)
 Contacted via MyChart   Normal mammogram, may repeat in one year:)

## 2023-11-19 ENCOUNTER — Other Ambulatory Visit

## 2023-11-19 DIAGNOSIS — R7989 Other specified abnormal findings of blood chemistry: Secondary | ICD-10-CM

## 2023-11-20 ENCOUNTER — Ambulatory Visit: Admitting: Licensed Practical Nurse

## 2023-11-20 ENCOUNTER — Ambulatory Visit: Payer: Self-pay | Admitting: Nurse Practitioner

## 2023-11-20 LAB — T4, FREE: Free T4: 1.45 ng/dL (ref 0.82–1.77)

## 2023-11-20 LAB — TSH: TSH: 1.62 u[IU]/mL (ref 0.450–4.500)

## 2023-11-20 NOTE — Progress Notes (Signed)
 Contacted via MyChart  Thyroid labs now normal, no changes needed.

## 2023-12-03 ENCOUNTER — Ambulatory Visit: Admitting: Licensed Practical Nurse

## 2023-12-10 ENCOUNTER — Ambulatory Visit: Admitting: Family Medicine

## 2024-02-11 ENCOUNTER — Telehealth: Payer: Self-pay | Admitting: Licensed Practical Nurse

## 2024-02-11 NOTE — Telephone Encounter (Signed)
 Pt has scheduled an annual appt with [facility] on 04/08/2024, but the appt note says looking into a hysterectomy.  Does the pt actually need an annual exam or is she wanting to discuss a possible hysterectomy?  If she definitely knows that she wants a hysterectomy, she will need to go to [place].  Left message for pt to call back.

## 2024-02-12 NOTE — Telephone Encounter (Signed)
 Pt's appt has been changed to a gyn visit.  She does not need her annual exam.  Wants to discuss the pros/cons of hysterectomy.

## 2024-02-24 ENCOUNTER — Other Ambulatory Visit: Payer: Self-pay

## 2024-02-24 ENCOUNTER — Emergency Department

## 2024-02-24 ENCOUNTER — Inpatient Hospital Stay
Admission: EM | Admit: 2024-02-24 | Discharge: 2024-02-26 | DRG: 176 | Disposition: A | Discharge status: Home / Self Care | Attending: Internal Medicine | Admitting: Internal Medicine

## 2024-02-24 DIAGNOSIS — Z832 Family history of diseases of the blood and blood-forming organs and certain disorders involving the immune mechanism: Secondary | ICD-10-CM

## 2024-02-24 DIAGNOSIS — I2699 Other pulmonary embolism without acute cor pulmonale: Secondary | ICD-10-CM | POA: Diagnosis present

## 2024-02-24 DIAGNOSIS — D509 Iron deficiency anemia, unspecified: Secondary | ICD-10-CM | POA: Diagnosis present

## 2024-02-24 DIAGNOSIS — F1290 Cannabis use, unspecified, uncomplicated: Secondary | ICD-10-CM | POA: Diagnosis present

## 2024-02-24 DIAGNOSIS — Z8249 Family history of ischemic heart disease and other diseases of the circulatory system: Secondary | ICD-10-CM | POA: Diagnosis not present

## 2024-02-24 DIAGNOSIS — I739 Peripheral vascular disease, unspecified: Secondary | ICD-10-CM | POA: Diagnosis present

## 2024-02-24 DIAGNOSIS — M13812 Other specified arthritis, left shoulder: Secondary | ICD-10-CM | POA: Diagnosis present

## 2024-02-24 DIAGNOSIS — E042 Nontoxic multinodular goiter: Secondary | ICD-10-CM | POA: Diagnosis present

## 2024-02-24 DIAGNOSIS — Z79899 Other long term (current) drug therapy: Secondary | ICD-10-CM | POA: Diagnosis not present

## 2024-02-24 DIAGNOSIS — D508 Other iron deficiency anemias: Secondary | ICD-10-CM

## 2024-02-24 DIAGNOSIS — Z7901 Long term (current) use of anticoagulants: Secondary | ICD-10-CM

## 2024-02-24 DIAGNOSIS — D573 Sickle-cell trait: Secondary | ICD-10-CM | POA: Diagnosis present

## 2024-02-24 DIAGNOSIS — Z95820 Peripheral vascular angioplasty status with implants and grafts: Secondary | ICD-10-CM

## 2024-02-24 DIAGNOSIS — E669 Obesity, unspecified: Secondary | ICD-10-CM | POA: Diagnosis present

## 2024-02-24 DIAGNOSIS — M4802 Spinal stenosis, cervical region: Secondary | ICD-10-CM | POA: Diagnosis present

## 2024-02-24 DIAGNOSIS — R079 Chest pain, unspecified: Secondary | ICD-10-CM

## 2024-02-24 DIAGNOSIS — I10 Essential (primary) hypertension: Secondary | ICD-10-CM | POA: Diagnosis present

## 2024-02-24 DIAGNOSIS — Z6834 Body mass index (BMI) 34.0-34.9, adult: Secondary | ICD-10-CM | POA: Diagnosis not present

## 2024-02-24 DIAGNOSIS — M12812 Other specific arthropathies, not elsewhere classified, left shoulder: Secondary | ICD-10-CM | POA: Insufficient documentation

## 2024-02-24 DIAGNOSIS — F1210 Cannabis abuse, uncomplicated: Secondary | ICD-10-CM | POA: Diagnosis present

## 2024-02-24 DIAGNOSIS — Z825 Family history of asthma and other chronic lower respiratory diseases: Secondary | ICD-10-CM

## 2024-02-24 DIAGNOSIS — E66811 Obesity, class 1: Secondary | ICD-10-CM | POA: Diagnosis present

## 2024-02-24 HISTORY — DX: Essential (primary) hypertension: I10

## 2024-02-24 LAB — BASIC METABOLIC PANEL WITH GFR
Anion gap: 7 (ref 5–15)
BUN: 9 mg/dL (ref 6–20)
CO2: 27 mmol/L (ref 22–32)
Calcium: 9.2 mg/dL (ref 8.9–10.3)
Chloride: 104 mmol/L (ref 98–111)
Creatinine, Ser: 0.84 mg/dL (ref 0.44–1.00)
GFR, Estimated: >60 mL/min (ref >60)
Glucose, Bld: 95 mg/dL (ref 70–99)
Potassium: 3.5 mmol/L (ref 3.5–5.1)
Sodium: 137 mmol/L (ref 135–145)

## 2024-02-24 LAB — HEPATIC FUNCTION PANEL
ALT: 8 U/L (ref 0–44)
AST: 14 U/L — ABNORMAL LOW (ref 15–41)
Albumin: 4.3 g/dL (ref 3.5–5.0)
Alkaline Phosphatase: 64 U/L (ref 38–126)
Bilirubin, Direct: 0.3 mg/dL — ABNORMAL HIGH (ref 0.0–0.2)
Indirect Bilirubin: 0.4 mg/dL (ref 0.3–0.9)
Total Bilirubin: 0.7 mg/dL (ref 0.0–1.2)
Total Protein: 7.4 g/dL (ref 6.5–8.1)

## 2024-02-24 LAB — PROTIME-INR
INR: 1 (ref 0.8–1.2)
Prothrombin Time: 13.3 s (ref 11.4–15.2)

## 2024-02-24 LAB — APTT: aPTT: 28 s (ref 24–36)

## 2024-02-24 LAB — LIPASE, BLOOD: Lipase: 49 U/L (ref 11–51)

## 2024-02-24 LAB — CBC
HCT: 35.2 % — ABNORMAL LOW (ref 36.0–46.0)
Hemoglobin: 11.5 g/dL — ABNORMAL LOW (ref 12.0–15.0)
MCH: 26.8 pg (ref 26.0–34.0)
MCHC: 32.7 g/dL (ref 30.0–36.0)
MCV: 82.1 fL (ref 80.0–100.0)
Platelets: 394 K/uL (ref 150–400)
RBC: 4.29 MIL/uL (ref 3.87–5.11)
RDW: 14.6 % (ref 11.5–15.5)
WBC: 8 K/uL (ref 4.0–10.5)
nRBC: 0 % (ref 0.0–0.2)

## 2024-02-24 LAB — POC URINE PREG, ED: Preg Test, Ur: NEGATIVE

## 2024-02-24 LAB — D-DIMER, QUANTITATIVE: D-Dimer, Quant: 0.64 ug{FEU}/mL — ABNORMAL HIGH (ref 0.00–0.50)

## 2024-02-24 LAB — TROPONIN T, HIGH SENSITIVITY
Troponin T High Sensitivity: <15 ng/L (ref 0–19)
Troponin T High Sensitivity: <15 ng/L (ref 0–19)

## 2024-02-24 MED ORDER — IOHEXOL 350 MG/ML SOLN
75.0000 mL | Freq: Once | INTRAVENOUS | Status: AC | PRN
  Administered 2024-02-24: 75 mL via INTRAVENOUS

## 2024-02-24 MED ORDER — ONDANSETRON HCL 4 MG/2ML IJ SOLN: 4.0000 mg | Freq: Four times a day (QID) | INTRAMUSCULAR | Status: DC | PRN

## 2024-02-24 MED ORDER — HEPARIN BOLUS VIA INFUSION
5000.0000 [IU] | Freq: Once | INTRAVENOUS | Status: AC
  Administered 2024-02-24: 5000 [IU] via INTRAVENOUS
  Filled 2024-02-24: qty 5000

## 2024-02-24 MED ORDER — HYDRALAZINE HCL 20 MG/ML IJ SOLN: 10.0000 mg | Freq: Three times a day (TID) | INTRAMUSCULAR | Status: DC | PRN

## 2024-02-24 MED ORDER — OXYCODONE-ACETAMINOPHEN 5-325 MG PO TABS
1.0000 | ORAL_TABLET | Freq: Once | ORAL | Status: AC
  Administered 2024-02-24: 1 via ORAL
  Filled 2024-02-24: qty 1

## 2024-02-24 MED ORDER — LIDOCAINE 5 % EX PTCH
1.0000 | MEDICATED_PATCH | Freq: Once | CUTANEOUS | Status: AC
  Administered 2024-02-24: 1 via TRANSDERMAL
  Filled 2024-02-24: qty 1

## 2024-02-24 MED ORDER — MORPHINE SULFATE (PF) 2 MG/ML IV SOLN
2.0000 mg | INTRAVENOUS | Status: DC | PRN
  Administered 2024-02-25 – 2024-02-26 (×7): 2 mg via INTRAVENOUS
  Filled 2024-02-24 (×7): qty 1

## 2024-02-24 MED ORDER — HEPARIN (PORCINE) 25000 UT/250ML-% IV SOLN
1500.0000 [IU]/h | INTRAVENOUS | Status: DC
  Administered 2024-02-24: 1200 [IU]/h via INTRAVENOUS
  Filled 2024-02-24: qty 250

## 2024-02-24 MED ORDER — CYCLOBENZAPRINE HCL 10 MG PO TABS
5.0000 mg | ORAL_TABLET | Freq: Once | ORAL | Status: AC
  Administered 2024-02-24: 5 mg via ORAL
  Filled 2024-02-24: qty 1

## 2024-02-24 MED ORDER — KETOROLAC TROMETHAMINE 15 MG/ML IJ SOLN
15.0000 mg | Freq: Once | INTRAMUSCULAR | Status: AC
  Administered 2024-02-24: 15 mg via INTRAVENOUS
  Filled 2024-02-24: qty 1

## 2024-02-24 MED ORDER — ONDANSETRON HCL 4 MG PO TABS: 4.0000 mg | ORAL_TABLET | Freq: Four times a day (QID) | ORAL | Status: DC | PRN

## 2024-02-24 NOTE — ED Notes (Signed)
 Pt returned from [place]

## 2024-02-24 NOTE — ED Provider Notes (Signed)
 [facility] Provider Note    Event Date/Time   First MD Initiated Contact with Patient 02/24/24 1455     (approximate)   History   Chest Pain   HPI  Kathleen [name] is a 43 y.o. female past history significant for obesity, sickle cell trait, hypertension, iron  deficiency anemia, presents to the emergency department with chest pain.  Patient states that for the past couple of days she has been having significant pain to her left chest wall and back.  Initially started on her left shoulder and now radiates from her left neck, back and into the middle of her back.  Denies any falls or trauma.  Pain is worse with movement and feels like she cannot get comfortable.  No significant shortness of breath.  Does not feel that it is worse with deep inspiration.  Denies any history of DVT or PE.  Not on any anticoagulation.  Does not take any OCPs and not on any estrogen replacement.  Does state that she has a prior history of a stent to her left lower extremity she is uncertain of the details of the stent but states that she had it placed a couple of years ago.  Was on aspirin but has not continued on aspirin since that time.  No recent trip or travel.  No family history coronary artery disease at a young age.     Physical Exam   Triage Vital Signs: ED Triage Vitals  Encounter Vitals Group     BP 02/24/24 1208 (!) 175/123     Girls Systolic BP Percentile --      Girls Diastolic BP Percentile --      Boys Systolic BP Percentile --      Boys Diastolic BP Percentile --      Pulse Rate 02/24/24 1208 88     Resp 02/24/24 1208 18     Temp 02/24/24 1208 98 F (36.7 C)     Temp src --      SpO2 02/24/24 1208 100 %     Weight 02/24/24 1206 205 lb (93 kg)     Height 02/24/24 1206 5' 6 (1.676 m)     Head Circumference --      Peak Flow --      Pain Score 02/24/24 1205 10     Pain Loc --      Pain Education --      Exclude from Growth Chart --     Most recent vital  signs: Vitals:   02/24/24 1208 02/24/24 1619  BP: (!) 175/123 (!) 155/105  Pulse: 88 83  Resp: 18 17  Temp: 98 F (36.7 C) 98.4 F (36.9 C)  SpO2: 100% 95%    Physical Exam Constitutional:      Appearance: She is well-developed.  HENT:     Head: Atraumatic.  Eyes:     Conjunctiva/sclera: Conjunctivae normal.  Cardiovascular:     Rate and Rhythm: Regular rhythm.     Pulses:          Radial pulses are 2+ on the right side and 2+ on the left side.       Dorsalis pedis pulses are 2+ on the right side and 2+ on the left side.     Heart sounds: Normal heart sounds.  Pulmonary:     Effort: No respiratory distress.     Breath sounds: No wheezing.  Chest:     Comments: Chest wall tenderness to palpation on  the left side that is reproducible Abdominal:     General: There is no distension.  Musculoskeletal:        General: Normal range of motion.     Cervical back: Normal range of motion.     Right lower leg: No edema.     Left lower leg: No edema.     Comments: No unilateral leg swelling  Skin:    General: Skin is warm.     Capillary Refill: Capillary refill takes less than 2 seconds.  Neurological:     General: No focal deficit present.     Mental Status: She is alert. Mental status is at baseline.     IMPRESSION / MDM / ASSESSMENT AND PLAN / ED COURSE  I reviewed the triage vital signs and the nursing notes.  Differential diagnosis including musculoskeletal strain, cervical radiculopathy, pneumonia, ACS, anemia, dissection, pulmonary embolism, pericarditis   EKG  I, Clotilda Punter, the attending physician, personally viewed and interpreted this ECG.  EKG with some artifact.  No significant ST elevation or depression.  Normal sinus rhythm.  Normal intervals.  No findings of acute ischemia or dysrhythmia. No tachycardic or bradycardic dysrhythmias while on cardiac telemetry.  RADIOLOGY I independently reviewed imaging, my interpretation of imaging: Chest x-ray no  signs of pneumonia read as no acute findings.  CTA discussed with Dr. Dwane, mild pulmonary embolism within the segmental and subsegmental posterior lobe to the right pulmonary artery.  LABS (all labs ordered are listed, but only abnormal results are displayed) Labs interpreted as -    Labs Reviewed  CBC - Abnormal; Notable for the following components:      Result Value   Hemoglobin 11.5 (*)    HCT 35.2 (*)    All other components within normal limits  D-DIMER, QUANTITATIVE - Abnormal; Notable for the following components:   D-Dimer, Quant 0.64 (*)    All other components within normal limits  HEPATIC FUNCTION PANEL - Abnormal; Notable for the following components:   AST 14 (*)    Bilirubin, Direct 0.3 (*)    All other components within normal limits  BASIC METABOLIC PANEL WITH GFR  LIPASE, BLOOD  APTT  PROTIME-INR  CBC  POC URINE PREG, ED  TROPONIN T, HIGH SENSITIVITY  TROPONIN T, HIGH SENSITIVITY     MDM  Patient given Lidoderm  patch and pain medication.  Possible musculoskeletal.  On reevaluation continues to have pain.  Given IV Toradol with significant improvement of her pain  Low risk heart score and troponin is negative have low suspicion for ACS  No significant leukocytosis.  Pregnancy test is negative  Low risk Wells criteria, D-dimer positive so we will proceed with CTA, given concern for possible dissection we will obtain a CTA angio  CTA discussed with radiologist, concern for segmental and subsegmental pulmonary embolism.  Started on heparin.  Consulted hospitalist for admission     PROCEDURES:  Critical Care performed: yes  .Critical Care  Performed by: Punter Clotilda, MD Authorized by: Punter Clotilda, MD   Critical care provider statement:    Critical care time (minutes):  45   Critical care time was exclusive of:  Separately billable procedures and treating other patients   Critical care was necessary to treat or prevent imminent or  life-threatening deterioration of the following conditions:  Circulatory failure (Pulmonary embolism)   Critical care was time spent personally by me on the following activities:  Development of treatment plan with patient or surrogate, discussions with consultants,  evaluation of patient's response to treatment, examination of patient, ordering and review of laboratory studies, ordering and review of radiographic studies, ordering and performing treatments and interventions, pulse oximetry, re-evaluation of patient's condition and review of old charts   Patient's presentation is most consistent with acute presentation with potential threat to life or bodily function.   MEDICATIONS ORDERED IN ED: Medications  lidocaine  (LIDODERM ) 5 % 1 patch (1 patch Transdermal Patch Applied 02/24/24 1541)  oxyCODONE-acetaminophen (PERCOCET/ROXICET) 5-325 MG per tablet 1 tablet (1 tablet Oral Given 02/24/24 1544)  cyclobenzaprine (FLEXERIL) tablet 5 mg (5 mg Oral Given 02/24/24 1543)  ketorolac (TORADOL) 15 MG/ML injection 15 mg (15 mg Intravenous Given 02/24/24 1746)  iohexol (OMNIPAQUE) 350 MG/ML injection 75 mL (75 mLs Intravenous Contrast Given 02/24/24 1800)    FINAL CLINICAL IMPRESSION(S) / ED DIAGNOSES   Final diagnoses:  Chest pain, unspecified type  Acute pulmonary embolism without acute cor pulmonale, unspecified pulmonary embolism type (HCC)     Rx / DC Orders   ED Discharge Orders     None        Note:  This document was prepared using Dragon voice recognition software and may include unintentional dictation errors.   Suzanne Kirsch, MD 02/24/24 1850

## 2024-02-24 NOTE — ED Notes (Signed)
 Pt transported to CT ?

## 2024-02-24 NOTE — Consult Note (Signed)
 PHARMACY - ANTICOAGULATION CONSULT NOTE  Pharmacy Consult for heparin dosing Indication: new-onset pulmonary embolus  No Known Allergies  Patient Measurements: Height: 5' 6 (167.6 cm) Weight: 93 kg (205 lb) IBW/kg (Calculated) : 59.3 HEPARIN DW (KG): 79.8  Vital Signs: Temp: 98.4 F (36.9 C) (11/19 1619) Temp Source: Oral (11/19 1619) BP: 155/105 (11/19 1619) Pulse Rate: 83 (11/19 1619)  Labs: Recent Labs    02/24/24 1207  HGB 11.5*  HCT 35.2*  PLT 394  CREATININE 0.84    Estimated Creatinine Clearance: 100.3 mL/min (by C-G formula based on SCr of 0.84 mg/dL).   Medical History: Past Medical History:  Diagnosis Date   Allergy    Anemia    Hypertension     Medications:  No PTA anticoagulation.  Assessment: Kathleen [name] is a 47 yoF presenting with chest pain starting 3-4 days prior. Past medical history is notable for obesity, sickle cell trait, hypertension, and iron  deficiency anemia. 11/19 CT chest showing Mild amount of pulmonary embolism within a segmental and subsegmental posterior lower lobe branch of the right pulmonary artery.   Baseline Hgb 11.5, Plt 394.  Goal of Therapy:  Heparin level 0.3-0.7 units/ml Monitor platelets by anticoagulation protocol: Yes   Plan:  Give 5000 units bolus x 1, and start infusion at 1200 units/hr Ordered baseline aPTT, PT/INR Check anti-Xa level in 6 hours and daily while on heparin Continue to monitor H&H and platelets  Leonor BROCKS Schae Cando 02/24/2024,6:46 PM

## 2024-02-24 NOTE — H&P (Signed)
 History and Physical    Patient: Kathleen [name] FMW:[id number] DOB: [birth year] DOA: 02/24/2024 DOS: the patient was seen and examined on 02/24/2024 PCP: Valerio Melanie DASEN, NP  Patient coming from: Home  Chief Complaint:  Chief Complaint  Patient presents with   Chest Pain   HPI: Kathleen [name] is a 43 y.o. female with medical history significant of essential hypertension, marijuana use, peripheral vascular disease status post stent placement since 2013 who presented to the ER with chest pain.  Pain is mainly involving the left shoulder.  Symptoms have been going on for about 3 to 4 days.  Radiates down the left arm and into the left neck.  Patient was seen evaluated in the ER.  CT angiogram dissection study performed showed a right-sided pulmonary embolus.  Patient denied tobacco abuse.  She smokes marijuana.  No birth control pills.  Has no recollection of family history of blood clots.  She does have the vasculopathy and peripheral vascular disease requiring stent in her lower extremity on the left.  At this point patient has been admitted for management of acute pulmonary embolus  Review of Systems: As mentioned in the history of present illness. All other systems reviewed and are negative. Past Medical History:  Diagnosis Date   Allergy    Anemia    Hypertension    Past Surgical History:  Procedure Laterality Date   STENT PLACEMENT VASCULAR ([facility] HX)     TUBAL LIGATION     Social History:  reports that she has never smoked. She has never used smokeless tobacco. She reports current alcohol use of about 3.0 standard drinks of alcohol per week. She reports that she does not currently use drugs after having used the following drugs: Marijuana.  No Known Allergies  Family History  Problem Relation Age of Onset   Congestive Heart Failure Mother    Pulmonary Hypertension Mother    COPD Mother    Sickle cell anemia Mother    Hypertension Mother    Irritable bowel syndrome  Sister    Alcohol abuse Maternal Grandmother     Prior to Admission medications   Medication Sig Start Date End Date Taking? Authorizing Provider  EPINEPHrine  0.3 mg/0.3 mL IJ SOAJ injection Inject 0.3 mg into the muscle as needed.  08/04/18   [provider]  Multiple Vitamin (MULTIVITAMIN) tablet Take 1 tablet by mouth daily.    [provider]  vitamin B-12 (CYANOCOBALAMIN) 250 MCG tablet Take 250 mcg by mouth daily.    [provider]    Physical Exam: Vitals:   02/24/24 1206 02/24/24 1208 02/24/24 1619  BP:  (!) 175/123 (!) 155/105  Pulse:  88 83  Resp:  18 17  Temp:  98 F (36.7 C) 98.4 F (36.9 C)  TempSrc:   Oral  SpO2:  100% 95%  Weight: 93 kg    Height: 5' 6 (1.676 m)     Constitutional: Acutely ill looking NAD, calm, comfortable Eyes: PERRL, lids and conjunctivae normal ENMT: Mucous membranes are moist. Posterior pharynx clear of any exudate or lesions.Normal dentition.  Neck: normal, supple, no masses, no thyromegaly Respiratory: clear to auscultation bilaterally, no wheezing, no crackles. Normal respiratory effort. No accessory muscle use.  Cardiovascular: Regular rate and rhythm, no murmurs / rubs / gallops. No extremity edema. 2+ pedal pulses. No carotid bruits.  Abdomen: no tenderness, no masses palpated. No hepatosplenomegaly. Bowel sounds positive.  Musculoskeletal: Good range of motion, no joint swelling or tenderness, Skin: no  rashes, lesions, ulcers. No induration Neurologic: CN 2-12 grossly intact. Sensation intact, DTR normal. Strength 5/5 in all 4.  Psychiatric: Normal judgment and insight. Alert and oriented x 3.  Anxious mood  Data Reviewed:  Temperature 98.4, blood pressure 175/123, hemoglobin 11.5.  Troponin is negative.  D-dimer 0.64.  INR 1.0 chest x-ray showed no acute finding.  CT angiogram of chest and aorta showed mild amount of pulmonary embolism within a segmental and subsegmental posterior lower lobe branch of the  right pulmonary artery with multiple small heterogeneous thyroid  nodules.  Assessment and Plan:  #1 acute pulmonary embolism: Patient will be admitted.  Initiate heparin  drip.  Gradually transition to oral anticoagulation for at least 3 months.  After 3 months workup can be done for genetic causes of pulmonary embolism.  No obvious risk factors.  No recent surgery  #2 essential hypertension: Continue with home blood pressure medications.  #3 iron  deficiency anemia: Monitor H&H  #4 marijuana abuse: Counseling provided    Advance Care Planning:   Code Status: Full Code   Consults: None  Family Communication: Sister at bedside  Severity of Illness: The appropriate patient status for this patient is INPATIENT. Inpatient status is judged to be reasonable and necessary in order to provide the required intensity of service to ensure the patient's safety. The patient's presenting symptoms, physical exam findings, and initial radiographic and laboratory data in the context of their chronic comorbidities is felt to place them at high risk for further clinical deterioration. Furthermore, it is not anticipated that the patient will be medically stable for discharge from the hospital within 2 midnights of admission.   * I certify that at the point of admission it is my clinical judgment that the patient will require inpatient hospital care spanning beyond 2 midnights from the point of admission due to high intensity of service, high risk for further deterioration and high frequency of surveillance required.*  AuthorBETHA SIM KNOLL, MD 02/24/2024 6:51 PM  For on call review [email or web address].

## 2024-02-24 NOTE — ED Notes (Signed)
 This RN called lab to get an update on blood work sent. Lab tech unable to locate. New bloodwork sent to lab at this time.

## 2024-02-24 NOTE — ED Triage Notes (Signed)
 Patient states chest pain that started 3-4 days ago; reports pain radiates down left arm and into left neck.

## 2024-02-25 ENCOUNTER — Inpatient Hospital Stay

## 2024-02-25 ENCOUNTER — Other Ambulatory Visit (HOSPITAL_COMMUNITY): Payer: Self-pay

## 2024-02-25 ENCOUNTER — Telehealth (HOSPITAL_COMMUNITY): Payer: Self-pay

## 2024-02-25 ENCOUNTER — Encounter: Payer: Self-pay | Admitting: Oncology

## 2024-02-25 DIAGNOSIS — I2699 Other pulmonary embolism without acute cor pulmonale: Secondary | ICD-10-CM | POA: Diagnosis not present

## 2024-02-25 LAB — COMPREHENSIVE METABOLIC PANEL WITH GFR
ALT: 7 U/L (ref 0–44)
AST: 17 U/L (ref 15–41)
Albumin: 3.9 g/dL (ref 3.5–5.0)
Alkaline Phosphatase: 59 U/L (ref 38–126)
Anion gap: 11 (ref 5–15)
BUN: 9 mg/dL (ref 6–20)
CO2: 25 mmol/L (ref 22–32)
Calcium: 8.8 mg/dL — ABNORMAL LOW (ref 8.9–10.3)
Chloride: 102 mmol/L (ref 98–111)
Creatinine, Ser: 0.78 mg/dL (ref 0.44–1.00)
GFR, Estimated: >60 mL/min (ref >60)
Glucose, Bld: 104 mg/dL — ABNORMAL HIGH (ref 70–99)
Potassium: 3.9 mmol/L (ref 3.5–5.1)
Sodium: 138 mmol/L (ref 135–145)
Total Bilirubin: 0.6 mg/dL (ref 0.0–1.2)
Total Protein: 7.1 g/dL (ref 6.5–8.1)

## 2024-02-25 LAB — CBC
HCT: 35.2 % — ABNORMAL LOW (ref 36.0–46.0)
Hemoglobin: 11.4 g/dL — ABNORMAL LOW (ref 12.0–15.0)
MCH: 26.4 pg (ref 26.0–34.0)
MCHC: 32.4 g/dL (ref 30.0–36.0)
MCV: 81.5 fL (ref 80.0–100.0)
Platelets: 370 K/uL (ref 150–400)
RBC: 4.32 MIL/uL (ref 3.87–5.11)
RDW: 14.5 % (ref 11.5–15.5)
WBC: 6.3 K/uL (ref 4.0–10.5)
nRBC: 0 % (ref 0.0–0.2)

## 2024-02-25 LAB — HEPARIN LEVEL (UNFRACTIONATED): Heparin Unfractionated: <0.1 [IU]/mL — ABNORMAL LOW (ref 0.30–0.70)

## 2024-02-25 MED ORDER — HEPARIN BOLUS VIA INFUSION
2400.0000 [IU] | Freq: Once | INTRAVENOUS | Status: AC
  Administered 2024-02-25: 2400 [IU] via INTRAVENOUS
  Filled 2024-02-25: qty 2400

## 2024-02-25 MED ORDER — APIXABAN 5 MG PO TABS
10.0000 mg | ORAL_TABLET | Freq: Two times a day (BID) | ORAL | Status: DC
  Administered 2024-02-25 – 2024-02-26 (×3): 10 mg via ORAL
  Filled 2024-02-25 (×3): qty 2

## 2024-02-25 MED ORDER — APIXABAN 5 MG PO TABS: 5.0000 mg | ORAL_TABLET | Freq: Two times a day (BID) | ORAL | Status: DC

## 2024-02-25 MED ORDER — METHOCARBAMOL 1000 MG/10ML IJ SOLN
500.0000 mg | Freq: Four times a day (QID) | INTRAMUSCULAR | Status: DC | PRN
  Administered 2024-02-25: 500 mg via INTRAVENOUS
  Filled 2024-02-25 (×2): qty 5

## 2024-02-25 NOTE — Progress Notes (Addendum)
 Progress Note    Kathleen [name]  FMW:[id number] DOB: 03-16-1981  DOA: 02/24/2024 PCP: Valerio Melanie DASEN, NP      Brief Narrative:    Medical records reviewed and are as summarized below:  Kathleen [name] is a 43 y.o. female with medical history significant for hypertension, marijuana use, iron  deficiency anemia requiring IV iron  infusion in the past, abnormal thyroid  function test, PVD s/p stent placement to the left lower extremity in 2013, has been off of aspirin for years, who presented to the ED with chest pain and left shoulder pain.  Symptoms started about 3 to 4 days prior to admission.  Chest pain appeared to radiate to the left shoulder and left arm.  CTA chest   IMPRESSION: 1. Mild amount of pulmonary embolism within a segmental and subsegmental posterior lower lobe branch of the right pulmonary artery. 2. Multiple small heterogeneous thyroid  nodules. Correlation with nonemergent thyroid  ultrasound is recommended.    She was admitted to the hospital for acute pulmonary embolism and was started on IV heparin drip.    Assessment/Plan:   Principal Problem:   Acute pulmonary embolism (HCC) Active Problems:   Obesity (BMI 30.0-34.9)   Essential hypertension   Marijuana smoker   Iron  deficiency anemia    Body mass index is 34.8 kg/m.    Acute right-sided pulmonary embolism, severe left shoulder and left arm pain: Discontinue IV heparin drip and start Eliquis.  Management of pulmonary embolism, risks and benefits of long-term anticoagulation were discussed.  Patient agreeable to start Eliquis.  Chest pain has improved but she continues to have severe left shoulder and left arm pain.  X-ray of the left shoulder and cervical spine were unremarkable. IV morphine as needed for severe pain.  Added IV Robaxin  for pain.  She has small right sided clot burden on CTA chest but because of severe left arm and shoulder pain, Dr. Marea, vascular surgeon, has been  consulted for further evaluation.  Case discussed with Dr. Marea.  He does not think patient will require thrombectomy.  Patient is convinced that there is something going on with head and neck, left shoulder/left arm and requested MRI for further evaluation.  MRI left shoulder and cervical spine have been ordered to investigate this further.   PVD with stent placement to the left lower extremity in 2013: She said she has been off of aspirin for many years.  Patient has been started on Eliquis.  Recommended follow-up with vascular surgeon for ongoing management.   Thyroid  nodules: Patient said she is aware of history of thyroid  nodules.  She also mentioned that she had history of mildly low TSH but last TSH done 3 months ago was normal.  Outpatient follow-up with PCP recommended.   Chronic iron  deficiency anemia: H&H stable.  Follow-up with PCP or hematologist for iron  infusions as needed.   Marijuana use   Diet Order             Diet Heart Room service appropriate? Yes; Fluid consistency: Thin  Diet effective now                                  Consultants: Vascular surgeon  Procedures: None    Medications:    Continuous Infusions:  heparin 1,500 Units/hr (02/25/24 0433)     Anti-infectives (From admission, onward)    None  Family Communication/Anticipated D/C date and plan/Code Status   DVT prophylaxis:      Code Status: Full Code  Family Communication: Plan discussed with her sister at the bedside Disposition Plan: Plan to discharge home   Status is: Inpatient Remains inpatient appropriate because: Acute pulmonary embolism with severe left arm and shoulder pain.       Subjective:   Interval events noted.  She complains of severe left shoulder pain.  No chest pain or shortness of breath.  Her sister was at the bedside.  Objective:    Vitals:   02/24/24 2100 02/24/24 2221 02/25/24 0436 02/25/24 0734  BP:  (!) 142/92 (!) 153/95 130/88 (!) 171/108  Pulse: 70 82 86 77  Resp:    18  Temp:  97.9 F (36.6 C) 99.1 F (37.3 C) 99 F (37.2 C)  TempSrc:  Oral Oral   SpO2: 99% 99% 98% 100%  Weight:      Height:       No data found.   Intake/Output Summary (Last 24 hours) at 02/25/2024 0912 Last data filed at 02/25/2024 0417 Gross per 24 hour  Intake 145.27 ml  Output --  Net 145.27 ml   Filed Weights   02/24/24 1206 02/24/24 1857  Weight: 93 kg 97.8 kg    Exam:  GEN: NAD SKIN: Warm and dry EYES: No pallor or icterus ENT: MMM CV: RRR PULM: CTA B ABD: soft, ND, NT, +BS CNS: AAO x 3, non focal EXT: Tenderness in the left shoulder with no obvious erythema or swelling.  Slightly limited range of motion of the left shoulder joint.        Data Reviewed:   I have personally reviewed following labs and imaging studies:  Labs: Labs show the following:   Basic Metabolic Panel: Recent Labs  Lab 02/24/24 1207 02/25/24 0234  NA 137 138  K 3.5 3.9  CL 104 102  CO2 27 25  GLUCOSE 95 104*  BUN 9 9  CREATININE 0.84 0.78  CALCIUM 9.2 8.8*   GFR Estimated Creatinine Clearance: 108 mL/min (by C-G formula based on SCr of 0.78 mg/dL). Liver Function Tests: Recent Labs  Lab 02/24/24 1545 02/25/24 0234  AST 14* 17  ALT 8 7  ALKPHOS 64 59  BILITOT 0.7 0.6  PROT 7.4 7.1  ALBUMIN 4.3 3.9   Recent Labs  Lab 02/24/24 1545  LIPASE 49   No results for input(s): AMMONIA in the last 168 hours. Coagulation profile Recent Labs  Lab 02/24/24 1941  INR 1.0    CBC: Recent Labs  Lab 02/24/24 1207 02/25/24 0234  WBC 8.0 6.3  HGB 11.5* 11.4*  HCT 35.2* 35.2*  MCV 82.1 81.5  PLT 394 370   Cardiac Enzymes: No results for input(s): CKTOTAL, CKMB, CKMBINDEX, TROPONINI in the last 168 hours. BNP (last 3 results) No results for input(s): PROBNP in the last 8760 hours. CBG: No results for input(s): GLUCAP in the last 168 hours. D-Dimer: Recent Labs     02/24/24 1636  DDIMER 0.64*   Hgb A1c: No results for input(s): HGBA1C in the last 72 hours. Lipid Profile: No results for input(s): CHOL, HDL, LDLCALC, TRIG, CHOLHDL, LDLDIRECT in the last 72 hours. Thyroid  function studies: No results for input(s): TSH, T4TOTAL, T3FREE, THYROIDAB in the last 72 hours.  Invalid input(s): FREET3 Anemia work up: No results for input(s): VITAMINB12, FOLATE, FERRITIN, TIBC, IRON , RETICCTPCT in the last 72 hours. Sepsis Labs: Recent Labs  Lab 02/24/24 1207 02/25/24 0234  WBC 8.0 6.3    Microbiology No results found for this or any previous visit (from the past 240 hours).  Procedures and diagnostic studies:  CT Angio Chest Aorta W and/or Wo Contrast Result Date: 02/24/2024 CLINICAL DATA:  Chest pain. EXAM: CT ANGIOGRAPHY CHEST WITH CONTRAST TECHNIQUE: Multidetector CT imaging of the chest was performed using the standard protocol during bolus administration of intravenous contrast. Multiplanar CT image reconstructions and MIPs were obtained to evaluate the vascular anatomy. RADIATION DOSE REDUCTION: This exam was performed according to the departmental dose-optimization program which includes automated exposure control, adjustment of the mA and/or kV according to patient size and/or use of iterative reconstruction technique. CONTRAST:  75mL OMNIPAQUE IOHEXOL 350 MG/ML SOLN COMPARISON:  None Available. FINDINGS: Cardiovascular: Satisfactory opacification of the pulmonary arteries to the segmental level. A mild amount of intraluminal low attenuation is seen within a segmental and subsegmental posterior lower lobe branch of the right pulmonary artery (axial CT images 94 through 98, CT series 8). Normal heart size without evidence of right heart strain (RV/LV ratio of 0.94). No pericardial effusion. Mediastinum/Nodes: No enlarged mediastinal, hilar, or axillary lymph nodes. Multiple small heterogeneous thyroid  nodules are seen  within the right and left lobes of the thyroid  gland. The trachea and esophagus demonstrate no significant findings. Lungs/Pleura: Lungs are clear. No pleural effusion or pneumothorax. Upper Abdomen: No acute abnormality. Musculoskeletal: No chest wall abnormality. No acute or significant osseous findings. Review of the MIP images confirms the above findings. IMPRESSION: 1. Mild amount of pulmonary embolism within a segmental and subsegmental posterior lower lobe branch of the right pulmonary artery. 2. Multiple small heterogeneous thyroid  nodules. Correlation with nonemergent thyroid  ultrasound is recommended. Electronically Signed   By: Suzen Dials M.D.   On: 02/24/2024 18:28   DG Chest 2 View Result Date: 02/24/2024 CLINICAL DATA:  chest pain EXAM: CHEST - 2 VIEW COMPARISON:  02/22/2022 FINDINGS: The heart size and mediastinal contours are within normal limits. Both lungs are clear. The visualized skeletal structures are unremarkable. IMPRESSION: No active cardiopulmonary disease. Electronically Signed   By: CHRISTELLA.  Shick M.D.   On: 02/24/2024 12:50               LOS: 1 day   Laquasha Groome  [facility]   Pager on [email or web address]. If 7PM-7AM, please contact night-coverage at [email or web address]     02/25/2024, 9:12 AM

## 2024-02-25 NOTE — Telephone Encounter (Signed)
 Pharmacy Patient Advocate Encounter  Insurance verification completed.    The patient is insured through [employer]. Patient has [employer], may use a copay card, and/or apply for patient assistance if available.    Ran test claim for Eliquis  Starter Pack and the current 30 day co-pay is $45.   This test claim was processed through  [facility]- copay amounts may vary at other pharmacies due to [employer], or as the patient moves through the different stages of their insurance plan.

## 2024-02-25 NOTE — Consult Note (Signed)
 PHARMACY - ANTICOAGULATION CONSULT NOTE  Pharmacy Consult for heparin  dosing Indication: new-onset pulmonary embolus  No Known Allergies  Patient Measurements: Height: 5' 6 (167.6 cm) Weight: 97.8 kg (215 lb 9.6 oz) IBW/kg (Calculated) : 59.3 HEPARIN  DW (KG): 79.8  Vital Signs: Temp: 97.9 F (36.6 C) (11/19 2221) Temp Source: Oral (11/19 2221) BP: 153/95 (11/19 2221) Pulse Rate: 82 (11/19 2221)  Labs: Recent Labs    02/24/24 1207 02/24/24 1941 02/25/24 0234  HGB 11.5*  --  11.4*  HCT 35.2*  --  35.2*  PLT 394  --  370  APTT  --  28  --   LABPROT  --  13.3  --   INR  --  1.0  --   HEPARINUNFRC  --   --  <0.10*  CREATININE 0.84  --  0.78    Estimated Creatinine Clearance: 108 mL/min (by C-G formula based on SCr of 0.78 mg/dL).   Medical History: Past Medical History:  Diagnosis Date   Allergy    Anemia    Hypertension     Medications:  No PTA anticoagulation.  Assessment: Nelsy Madonna is a 52 yoF presenting with chest pain starting 3-4 days prior. Past medical history is notable for obesity, sickle cell trait, hypertension, and iron  deficiency anemia. 11/19 CT chest showing Mild amount of pulmonary embolism within a segmental and subsegmental posterior lower lobe branch of the right pulmonary artery.   Baseline Hgb 11.5, Plt 394.  Goal of Therapy:  Heparin  level 0.3-0.7 units/ml Monitor platelets by anticoagulation protocol: Yes   Plan:  11/20:  HL @ 0234 = < 0.10 - will order heparin  2400 units IV X 1 bolus and increase drip rate to 1500 units/hr - recheck HL 6 hrs after rate change  Continue to monitor H&H and platelets  Mirren Gest D 02/25/2024,4:16 AM

## 2024-02-25 NOTE — Discharge Summary (Incomplete)
 Physician Discharge Summary   Patient: Kathleen [name] MRN: [id number] DOB: 01-10-1981  Admit date:     02/24/2024  Discharge date: {dischdate:26783}  Discharge Physician: AIDA CHO   PCP: Valerio Melanie DASEN, NP   Recommendations at discharge:  {Tip this will not be part of the note when signed- Example include specific recommendations for outpatient follow-up, pending tests to follow-up on. (Optional):26781}  ***  Discharge Diagnoses: Principal Problem:   Acute pulmonary embolism (HCC) Active Problems:   Obesity (BMI 30.0-34.9)   Essential hypertension   Marijuana smoker   Iron  deficiency anemia  Resolved Problems:   * No resolved hospital problems. [facility] Course: No notes on file  Assessment and Plan: No notes have been filed under this hospital service. Service: Hospitalist     {Tip this will not be part of the note when signed Body mass index is 34.8 kg/m. , ,  (Optional):26781}  {(NOTE) Pain control PDMP Statment (Optional):26782} Consultants: *** Procedures performed: ***  Disposition: {Plan; Disposition:26390} Diet recommendation:  {Diet_Plan:26776} DISCHARGE MEDICATION: Allergies as of 02/25/2024   No Known Allergies   Med Rec must be completed prior to using this SMARTLINK***       Follow-up Information     [facility] 1C MEDICAL TELEMETRY. Schedule an appointment as soon as possible for a visit in 1 month(s).   Contact information: [address] [place]  [phone]               Discharge Exam: [name]   02/24/24 1206 02/24/24 1857  Weight: 93 kg 97.8 kg   ***  Condition at discharge: {DC Condition:26389}  The results of significant diagnostics from this hospitalization (including imaging, microbiology, ancillary and laboratory) are listed below for reference.   Imaging Studies: CT Angio Chest Aorta W and/or Wo Contrast Result Date: 02/24/2024 CLINICAL  DATA:  Chest pain. EXAM: CT ANGIOGRAPHY CHEST WITH CONTRAST TECHNIQUE: Multidetector CT imaging of the chest was performed using the standard protocol during bolus administration of intravenous contrast. Multiplanar CT image reconstructions and MIPs were obtained to evaluate the vascular anatomy. RADIATION DOSE REDUCTION: This exam was performed according to the departmental dose-optimization program which includes automated exposure control, adjustment of the mA and/or kV according to patient size and/or use of iterative reconstruction technique. CONTRAST:  75mL OMNIPAQUE  IOHEXOL  350 MG/ML SOLN COMPARISON:  None Available. FINDINGS: Cardiovascular: Satisfactory opacification of the pulmonary arteries to the segmental level. A mild amount of intraluminal low attenuation is seen within a segmental and subsegmental posterior lower lobe branch of the right pulmonary artery (axial CT images 94 through 98, CT series 8). Normal heart size without evidence of right heart strain (RV/LV ratio of 0.94). No pericardial effusion. Mediastinum/Nodes: No enlarged mediastinal, hilar, or axillary lymph nodes. Multiple small heterogeneous thyroid  nodules are seen within the right and left lobes of the thyroid  gland. The trachea and esophagus demonstrate no significant findings. Lungs/Pleura: Lungs are clear. No pleural effusion or pneumothorax. Upper Abdomen: No acute abnormality. Musculoskeletal: No chest wall abnormality. No acute or significant osseous findings. Review of the MIP images confirms the above findings. IMPRESSION: 1. Mild amount of pulmonary embolism within a segmental and subsegmental posterior lower lobe branch of the right pulmonary artery. 2. Multiple small heterogeneous thyroid  nodules. Correlation with nonemergent thyroid  ultrasound is recommended. Electronically Signed   By: Suzen Dials M.D.   On: 02/24/2024 18:28   DG Chest 2 View Result Date: 02/24/2024 CLINICAL DATA:  chest pain  EXAM: CHEST - 2  VIEW COMPARISON:  02/22/2022 FINDINGS: The heart size and mediastinal contours are within normal limits. Both lungs are clear. The visualized skeletal structures are unremarkable. IMPRESSION: No active cardiopulmonary disease. Electronically Signed   By: CHRISTELLA.  Shick M.D.   On: 02/24/2024 12:50    Microbiology: Results for orders placed or performed in visit on 10/20/23  GC/Chlamydia Probe Amp     Status: None   Collection Time: 10/20/23 11:56 AM   Specimen: Blood   UR  Result Value Ref Range Status   Chlamydia trachomatis, NAA Negative Negative Final   Neisseria Gonorrhoeae by PCR Negative Negative Final    Labs: CBC: Recent Labs  Lab 02/24/24 1207 02/25/24 0234  WBC 8.0 6.3  HGB 11.5* 11.4*  HCT 35.2* 35.2*  MCV 82.1 81.5  PLT 394 370   Basic Metabolic Panel: Recent Labs  Lab 02/24/24 1207 02/25/24 0234  NA 137 138  K 3.5 3.9  CL 104 102  CO2 27 25  GLUCOSE 95 104*  BUN 9 9  CREATININE 0.84 0.78  CALCIUM 9.2 8.8*   Liver Function Tests: Recent Labs  Lab 02/24/24 1545 02/25/24 0234  AST 14* 17  ALT 8 7  ALKPHOS 64 59  BILITOT 0.7 0.6  PROT 7.4 7.1  ALBUMIN 4.3 3.9   CBG: No results for input(s): GLUCAP in the last 168 hours.  Discharge time spent: {LESS THAN/GREATER UYJW:73611} 30 minutes.  Signed: AIDA CHO, MD [facility] 02/25/2024

## 2024-02-25 NOTE — Plan of Care (Signed)

## 2024-02-25 NOTE — Consult Note (Signed)
 Hospital Consult    Reason for Consult:  Pulmonary Embolism No Right Heart Strain.  Requesting Physician:  Dr Aida Cho MD  MRN #:  [id number]  History of Present Illness: This is a 43 y.o. female with medical history significant of essential hypertension, marijuana use, peripheral vascular disease status post stent placement since 2013 who presented to the ER with chest pain.  Pain is mainly involving the left shoulder.  Symptoms have been going on for about 3 to 4 days.  Radiates down the left arm and into the left neck.  Patient was seen evaluated in the ER.  CT angiogram dissection study performed showed a right-sided pulmonary embolus.  Patient denied tobacco abuse.  She smokes marijuana.  No birth control pills.  Has no recollection of family history of blood clots.  She does have the vasculopathy and peripheral vascular disease requiring stent in her lower extremity on the left.  At this point patient has been admitted for management of acute pulmonary embolus.  Vascular surgery consulted to evaluate.  Past Medical History:  Diagnosis Date   Allergy    Anemia    Hypertension     Past Surgical History:  Procedure Laterality Date   STENT PLACEMENT VASCULAR ([facility] HX)     TUBAL LIGATION      No Known Allergies  Prior to Admission medications   Medication Sig Start Date End Date Taking? Authorizing Provider  EPINEPHrine  0.3 mg/0.3 mL IJ SOAJ injection Inject 0.3 mg into the muscle as needed.  Patient not taking: Reported on 02/24/2024 08/04/18   [provider]  Multiple Vitamin (MULTIVITAMIN) tablet Take 1 tablet by mouth daily. Patient not taking: Reported on 02/24/2024    [provider]  vitamin B-12 (CYANOCOBALAMIN) 250 MCG tablet Take 250 mcg by mouth daily.    [provider]    Social History   Socioeconomic History   Marital status: Single    Spouse name: Not on file   Number of children: Not on file   Years of education: Not on file    Highest education level: Some college, no degree  Occupational History   Not on file  Tobacco Use   Smoking status: Never   Smokeless tobacco: Never  Vaping Use   Vaping status: Never Used  Substance and Sexual Activity   Alcohol use: Yes    Alcohol/week: 3.0 standard drinks of alcohol    Types: 1 Glasses of wine, 2 Shots of liquor per week   Drug use: Not Currently    Types: Marijuana   Sexual activity: Yes    Birth control/protection: Other-see comments    Comment: Tubal ligation  Other Topics Concern   Not on file  Social History Narrative   Not on file   Social Drivers of Health   Financial Resource Strain: Low Risk  (10/20/2023)   Overall Financial Resource Strain (CARDIA)    Difficulty of Paying Living Expenses: Not hard at all  Food Insecurity: No Food Insecurity (02/24/2024)   Hunger Vital Sign    Worried About Running Out of Food in the Last Year: Never true    Ran Out of Food in the Last Year: Never true  Transportation Needs: No Transportation Needs (02/24/2024)   PRAPARE - Administrator, Civil Service (Medical): No    Lack of Transportation (Non-Medical): No  Physical Activity: Insufficiently Active (10/20/2023)   Exercise Vital Sign    Days of Exercise per Week: 3 days    Minutes  of Exercise per Session: 30 min  Stress: No Stress Concern Present (10/20/2023)   [employer] of Occupational Health - Occupational Stress Questionnaire    Feeling of Stress: Only a little  Social Connections: Socially Isolated (10/20/2023)   Social Connection and Isolation Panel    Frequency of Communication with Friends and Family: Once a week    Frequency of Social Gatherings with Friends and Family: Once a week    Attends Religious Services: More than 4 times per year    Active Member of [employer] or Organizations: No    Attends Engineer, Structural: Not on file    Marital Status: Never married  Intimate Partner Violence: Not At Risk (02/24/2024)    Humiliation, Afraid, Rape, and Kick questionnaire    Fear of Current or Ex-Partner: No    Emotionally Abused: No    Physically Abused: No    Sexually Abused: No     Family History  Problem Relation Age of Onset   Congestive Heart Failure Mother    Pulmonary Hypertension Mother    COPD Mother    Sickle cell anemia Mother    Hypertension Mother    Irritable bowel syndrome Sister    Alcohol abuse Maternal Grandmother     ROS: Otherwise negative unless mentioned in HPI  Physical Examination  Vitals:   02/25/24 0734 02/25/24 1559  BP: (!) 171/108 (!) 152/92  Pulse: 77 78  Resp: 18 16  Temp: 99 F (37.2 C) 98.9 F (37.2 C)  SpO2: 100% 97%   Body mass index is 34.8 kg/m.  General:  WDWN in NAD Gait: Not observed HENT: WNL, normocephalic Pulmonary: normal non-labored breathing, without Rales, rhonchi,  wheezing Cardiac: regular, without  Murmurs, rubs or gallops; without carotid bruits Abdomen: Positive bowel sounds throughout, soft, NT/ND, no masses Skin: without rashes Vascular Exam/Pulses: All extremities warm to touch with palpable pulses. Extremities: without ischemic changes, without Gangrene , without cellulitis; without open wounds;  Musculoskeletal: no muscle wasting or atrophy  Neurologic: A&O X 3;  No focal weakness or paresthesias are detected; speech is fluent/normal Psychiatric:  The pt has Normal affect. Lymph:  Unremarkable  CBC    Component Value Date/Time   WBC 6.3 02/25/2024 0234   RBC 4.32 02/25/2024 0234   HGB 11.4 (L) 02/25/2024 0234   HGB 12.4 10/20/2023 0902   HCT 35.2 (L) 02/25/2024 0234   HCT 39.4 10/20/2023 0902   PLT 370 02/25/2024 0234   PLT 450 10/20/2023 0902   MCV 81.5 02/25/2024 0234   MCV 92 10/20/2023 0902   MCH 26.4 02/25/2024 0234   MCHC 32.4 02/25/2024 0234   RDW 14.5 02/25/2024 0234   RDW 13.7 10/20/2023 0902   LYMPHSABS 1.5 10/20/2023 0902   MONOABS 0.7 09/30/2022 1045   EOSABS 0.1 10/20/2023 0902   BASOSABS 0.0  10/20/2023 0902    BMET    Component Value Date/Time   NA 138 02/25/2024 0234   NA 140 10/20/2023 0901   K 3.9 02/25/2024 0234   CL 102 02/25/2024 0234   CO2 25 02/25/2024 0234   GLUCOSE 104 (H) 02/25/2024 0234   BUN 9 02/25/2024 0234   BUN 11 10/20/2023 0901   CREATININE 0.78 02/25/2024 0234   CALCIUM 8.8 (L) 02/25/2024 0234   GFRNONAA >60 02/25/2024 0234   GFRAA 103 10/24/2019 1156    COAGS: Lab Results  Component Value Date   INR 1.0 02/24/2024     Non-Invasive Vascular Imaging:   EXAM:02/24/2024 CT ANGIOGRAPHY CHEST  WITH CONTRAST   TECHNIQUE: Multidetector CT imaging of the chest was performed using the standard protocol during bolus administration of intravenous contrast. Multiplanar CT image reconstructions and MIPs were obtained to evaluate the vascular anatomy.   RADIATION DOSE REDUCTION: This exam was performed according to the departmental dose-optimization program which includes automated exposure control, adjustment of the mA and/or kV according to patient size and/or use of iterative reconstruction technique.   CONTRAST:  75mL OMNIPAQUE  IOHEXOL  350 MG/ML SOLN   COMPARISON:  None Available.   FINDINGS: Cardiovascular: Satisfactory opacification of the pulmonary arteries to the segmental level. A mild amount of intraluminal low attenuation is seen within a segmental and subsegmental posterior lower lobe branch of the right pulmonary artery (axial CT images 94 through 98, CT series 8). Normal heart size without evidence of right heart strain (RV/LV ratio of 0.94). No pericardial effusion.   Mediastinum/Nodes: No enlarged mediastinal, hilar, or axillary lymph nodes. Multiple small heterogeneous thyroid  nodules are seen within the right and left lobes of the thyroid  gland. The trachea and esophagus demonstrate no significant findings.   Lungs/Pleura: Lungs are clear. No pleural effusion or pneumothorax.   Upper Abdomen: No acute abnormality.    Musculoskeletal: No chest wall abnormality. No acute or significant osseous findings.   Review of the MIP images confirms the above findings.   IMPRESSION: 1. Mild amount of pulmonary embolism within a segmental and subsegmental posterior lower lobe branch of the right pulmonary artery. 2. Multiple small heterogeneous thyroid  nodules. Correlation with nonemergent thyroid  ultrasound is recommended.  Statin:  No. Beta Blocker:  No. Aspirin:  No. ACEI:  No. ARB:  No. CCB use:  No Other antiplatelets/anticoagulants:  No.    ASSESSMENT/PLAN: This is a 43 y.o. female who presents to [facility] emergency department with chest pain specifically radiating to her left shoulder.  Patient underwent a workup including a CTA of the chest with PE protocol.  She was found to have very very small to minimal pulmonary embolism in the right lower lung.  At this point in time vascular surgery does not feel a pulmonary thrombectomy would benefit her.  Vitals are all stable and she is without right heart strain on scan.  Therefore we recommend anticoagulation only.  I would start her on Eliquis  10 mg twice daily for 7 days then decrease to 5 mg twice daily for at least a year.  Patient can follow-up with us  in 6 months for reevaluation.  Patient has a prior history of stent placement to her left lower extremity.  I would recommend bilateral duplex venous ultrasounds of her lower extremities on follow-up.   -I discussed the case in detail with Dr. Selinda Gu MD and he agrees with the plan.   Gwendlyn JONELLE Shank [facility] 02/25/2024 4:49 PM

## 2024-02-26 ENCOUNTER — Other Ambulatory Visit: Payer: Self-pay

## 2024-02-26 ENCOUNTER — Encounter: Payer: Self-pay | Admitting: Oncology

## 2024-02-26 DIAGNOSIS — M4802 Spinal stenosis, cervical region: Secondary | ICD-10-CM | POA: Diagnosis present

## 2024-02-26 DIAGNOSIS — M12812 Other specific arthropathies, not elsewhere classified, left shoulder: Secondary | ICD-10-CM | POA: Insufficient documentation

## 2024-02-26 DIAGNOSIS — I2699 Other pulmonary embolism without acute cor pulmonale: Secondary | ICD-10-CM | POA: Diagnosis not present

## 2024-02-26 LAB — CBC
HCT: 34.1 % — ABNORMAL LOW (ref 36.0–46.0)
Hemoglobin: 11.2 g/dL — ABNORMAL LOW (ref 12.0–15.0)
MCH: 26.5 pg (ref 26.0–34.0)
MCHC: 32.8 g/dL (ref 30.0–36.0)
MCV: 80.8 fL (ref 80.0–100.0)
Platelets: 367 K/uL (ref 150–400)
RBC: 4.22 MIL/uL (ref 3.87–5.11)
RDW: 14.6 % (ref 11.5–15.5)
WBC: 6.7 K/uL (ref 4.0–10.5)
nRBC: 0 % (ref 0.0–0.2)

## 2024-02-26 MED ORDER — EPINEPHRINE 0.3 MG/0.3ML IJ SOAJ: 0.3000 mg | INTRAMUSCULAR | 0 refills | Status: AC | PRN

## 2024-02-26 MED ORDER — APIXABAN (ELIQUIS) VTE STARTER PACK (10MG AND 5MG): ORAL_TABLET | ORAL | 0 refills | Status: DC

## 2024-02-26 MED ORDER — ACETAMINOPHEN 325 MG PO TABS: 650.0000 mg | ORAL_TABLET | Freq: Four times a day (QID) | ORAL | Status: DC | PRN

## 2024-02-26 MED ORDER — BISACODYL 5 MG PO TBEC
5.0000 mg | DELAYED_RELEASE_TABLET | Freq: Once | ORAL | Status: AC | PRN
  Administered 2024-02-26: 5 mg via ORAL
  Filled 2024-02-26: qty 1

## 2024-02-26 MED ORDER — CYCLOBENZAPRINE HCL 5 MG PO TABS: 5.0000 mg | ORAL_TABLET | Freq: Three times a day (TID) | ORAL | 0 refills | Status: DC | PRN

## 2024-02-26 MED ORDER — ACETAMINOPHEN 325 MG PO TABS: 650.0000 mg | ORAL_TABLET | Freq: Four times a day (QID) | ORAL | Status: AC | PRN

## 2024-02-26 MED ORDER — POLYETHYLENE GLYCOL 3350 17 G PO PACK
17.0000 g | PACK | Freq: Once | ORAL | Status: AC
  Administered 2024-02-26: 17 g via ORAL
  Filled 2024-02-26: qty 1

## 2024-02-26 MED ORDER — APIXABAN (ELIQUIS) VTE STARTER PACK (10MG AND 5MG)
ORAL_TABLET | ORAL | 0 refills | Status: DC
  Filled 2024-02-26 (×2): qty 74, 30d supply, fill #0

## 2024-02-26 NOTE — Discharge Summary (Signed)
 Physician Discharge Summary   Patient: Kathleen [name] MRN: [id number] DOB: 1980/11/03  Admit date:     02/24/2024  Discharge date: 02/26/24  Discharge Physician: AIDA CHO   PCP: Valerio Melanie DASEN, NP   Recommendations at discharge:   Follow-up with PCP in 1 to 2 weeks Follow-up with [facility] within 1 month of discharge Follow-up with [facility] within 2 weeks of discharge Follow-up with hematologist within 3 months of discharge  Discharge Diagnoses: Principal Problem:   Acute pulmonary embolism (HCC) Active Problems:   Obesity (BMI 30.0-34.9)   Essential hypertension   Marijuana smoker   Iron  deficiency anemia   Foraminal stenosis of cervical region   Rotator cuff arthropathy, left  Resolved Problems:   * No resolved hospital problems. *  Hospital Course:  Kathleen [name] is a 43 y.o. female with medical history significant for hypertension, marijuana use, iron  deficiency anemia requiring IV iron  infusion in the past, abnormal thyroid  function test, PVD s/p stent placement to the left lower extremity in 2013, has been off of aspirin for years, who presented to the ED with chest pain and left shoulder pain.  Symptoms started about 3 to 4 days prior to admission.  Chest pain appeared to radiate to the left shoulder and left arm.   CTA chest    IMPRESSION: 1. Mild amount of pulmonary embolism within a segmental and subsegmental posterior lower lobe branch of the right pulmonary artery. 2. Multiple small heterogeneous thyroid  nodules. Correlation with nonemergent thyroid  ultrasound is recommended.       She was admitted to the hospital for acute pulmonary embolism and was started on IV heparin  drip.    Assessment and Plan:   Acute right-sided pulmonary embolism: She will be discharged on Eliquis .  Outpatient follow-up with hematologist was recommended. She was evaluated by vascular surgery team.  No intervention was required.   Outpatient follow-up with vascular surgeon in 6 months recommended.   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  Outpatient follow-up with neurosurgeon and orthopedic surgeon was recommended. Tylenol  and Flexeril  as needed for pain.     PVD with stent placement to the left lower extremity in 2013: She said she has been off of aspirin for many years.  Patient has been started on Eliquis .  Recommended follow-up with vascular surgeon for ongoing management.     Thyroid  nodules: Patient said she is aware of history of thyroid  nodules.  She also mentioned that she had history of mildly low TSH but last TSH done 3 months ago was normal.  Outpatient follow-up with PCP recommended.     Chronic iron  deficiency anemia: H&H stable.  Follow-up with PCP or hematologist for iron  infusions as needed.    Elevated blood pressure: Patient said she is not a known hypertensive and she believes that blood pressure was elevated because of pain.  Outpatient follow-up with PCP recommended.    Marijuana use   Her condition has improved and she is deemed stable for discharge to home today.  Discharge plan was discussed with the patient and her friend at the bedside.      Consultants: Vascular surgeon Procedures performed: None Disposition: Home Diet recommendation:  Discharge Diet Orders (From admission, onward)     Start     Ordered   02/26/24 0000  Diet - low  sodium heart healthy        02/26/24 1058           Cardiac diet DISCHARGE MEDICATION: Allergies as of 02/26/2024   No Known Allergies      Medication List     STOP taking these medications    cyanocobalamin 250 MCG tablet Commonly known as: VITAMIN B12   multivitamin tablet       TAKE these medications    acetaminophen  325 MG  tablet Commonly known as: TYLENOL  Take 2 tablets (650 mg total) by mouth every 6 (six) hours as needed for mild pain (pain score 1-3) or moderate pain (pain score 4-6).   cyclobenzaprine  5 MG tablet Commonly known as: FLEXERIL  Take 1 tablet (5 mg total) by mouth 3 (three) times daily as needed for muscle spasms.   Eliquis  DVT/PE Starter Pack Generic drug: Apixaban  Starter Pack (10mg  and 5mg ) Take as directed on package: start with two-5mg  tablets twice daily for 7 days. On day 8, switch to one-5mg  tablet twice daily.   EPINEPHrine  0.3 mg/0.3 mL Soaj injection Commonly known as: EPI-PEN Inject 0.3 mg into the muscle as needed for anaphylaxis. What changed: reasons to take this        Follow-up Information     [facility] MEDICAL TELEMETRY. Schedule an appointment as soon as possible for a visit in 1 month(s).   Contact information: [address] [place]  [address] [phone]        Marea Selinda RAMAN, MD Follow up in 6 month(s).   Specialties: Vascular Surgery, Radiology, Interventional Cardiology Why: Bilateral lower Venous Duplex Ultrasounds Contact information: [address] [place] [address] [phone]         [facility] [place]. Schedule an appointment as soon as possible for a visit in 2 week(s).   Specialty: Neurosurgery Contact information: [address], [address] [place]  [address] [phone]        Valerio Moris T, NP. Schedule an appointment as soon as possible for a visit in 1 week(s).   Specialty: Nurse Practitioner Contact information: [address] [place] [address] [phone]         [facility] ([facility]). Schedule an appointment as soon as possible for a visit in 1 week(s).   Contact information: [address] [place]  [address] [phone]                Discharge Exam: [name]   02/24/24 1206 02/24/24 1857  Weight: 93 kg 97.8 kg   GEN: NAD SKIN: No rash EYES: No pallor or icterus ENT: MMM CV: RRR PULM: CTA B ABD: soft, ND, NT, +BS CNS: AAO x 3, non focal EXT: No edema or tenderness   Condition at discharge: good  The results of significant diagnostics from this hospitalization (including imaging, microbiology, ancillary and laboratory) are listed below for reference.   Imaging Studies: MR SHOULDER LEFT WO CONTRAST Result Date: 02/26/2024 CLINICAL DATA:  Shoulder pain. EXAM: MRI OF THE LEFT SHOULDER WITHOUT CONTRAST TECHNIQUE: Multiplanar, multisequence MR imaging of the shoulder was performed. No intravenous contrast was administered. COMPARISON:  Left shoulder radiographs dated 02/25/2024. FINDINGS: Rotator cuff: Mild supraspinatus and infraspinatus tendinosis. Teres minor tendon is intact. Subscapularis tendon is intact. Muscles: No muscle atrophy or edema. Biceps Long Head: Intact. Acromioclavicular Joint: Mild arthropathy of the acromioclavicular joint. No subacromial/subdeltoid bursal fluid. Glenohumeral Joint: No joint effusion. No chondral defect.  Labrum: Grossly intact, but evaluation is limited by lack of intraarticular fluid/contrast. Bones: No fracture or dislocation. No aggressive osseous lesion. Subcortical cystic changes of the greater tuberosity, likely secondary to chronic rotator cuff pathology. Other: No fluid collection. IMPRESSION: 1. Mild tendinosis of the supraspinatus and infraspinatus tendons. 2. Subcortical cystic changes of the greater tuberosity, likely secondary to chronic rotator cuff pathology. 3. Mild arthropathy of the acromioclavicular joint. Electronically Signed   By: Harrietta Sherry M.D.   On: 02/26/2024 12:04   MR CERVICAL SPINE WO CONTRAST Result Date: 02/26/2024 EXAM: MRI CERVICAL SPINE WITHOUT CONTRAST 02/25/2024 11:11:08 PM TECHNIQUE: Multiplanar multisequence MRI of the cervical spine  was performed. COMPARISON: Cervical spine radiographs from earlier today. CLINICAL HISTORY: Cervical radiculopathy, no red flags; neck pain, left shoulder and arm pain. FINDINGS: BONES AND ALIGNMENT: Normal alignment. Normal vertebral body heights. Bone marrow signal is unremarkable. SPINAL CORD: Normal spinal cord size. No abnormal spinal cord signal. SOFT TISSUES: No paraspinal mass. C2-C3: No significant disc herniation. No spinal canal stenosis or neural foraminal narrowing. C3-C4: No significant disc herniation. No spinal canal stenosis. Left greater than right facet and uncovertebral hypertrophy. Resulting mild left foraminal stenosis. C4-C5: No significant disc herniation. No spinal canal stenosis. Right greater than left facet and uncovertebral hypertrophy. Resulting moderate right foraminal stenosis. C5-C6: Small posterior disc-osteophyte complex. No spinal canal stenosis. Right greater than left facet and uncovertebral hypertrophy. Resulting mild right foraminal stenosis. C6-C7: Small posterior disc-osteophyte complex. No spinal canal stenosis. Left greater than right facet and uncovertebral hypertrophy. Resulting mild left foraminal stenosis. C7-T1: No significant disc herniation. No spinal canal stenosis or neural foraminal narrowing. IMPRESSION: 1. Moderate right foraminal stenosis at C4-C5. 2. Mild foraminal stenosis on the right at C3-C4 and C5-C6 and the left at C6-C7. 3. Small posterior disc-osteophyte complexes at C5-C6 and C6-C7 without significant canal stenosis. Electronically signed by: Gilmore Molt MD 02/26/2024 01:09 AM EST RP Workstation: [id number]   DG Shoulder Left Result Date: 02/25/2024 CLINICAL DATA:  Left shoulder pain EXAM: LEFT SHOULDER - 2+ VIEW COMPARISON:  None Available. FINDINGS: There is no evidence of fracture or dislocation. There is no evidence of arthropathy or other focal bone abnormality. Soft tissues are unremarkable. IMPRESSION: Negative. Electronically Signed    By: Greig Pique M.D.   On: 02/25/2024 15:22   DG Cervical Spine 2 or 3 views Result Date: 02/25/2024 CLINICAL DATA:  Neck pain. EXAM: CERVICAL SPINE - 2-3 VIEW COMPARISON:  None Available. FINDINGS: No acute fracture or subluxation of the cervical spine. There is spurring and osteophyte along the anterior C5-C6 and C6-C7. The visualized posterior elements and odontoid appear intact. There is anatomic alignment of the lateral masses of C1 and C2. The soft tissues are unremarkable. IMPRESSION: No acute findings. Electronically Signed   By: Vanetta Chou M.D.   On: 02/25/2024 15:22   CT Angio Chest Aorta W and/or Wo Contrast Result Date: 02/24/2024 CLINICAL DATA:  Chest pain. EXAM: CT ANGIOGRAPHY CHEST WITH CONTRAST TECHNIQUE: Multidetector CT imaging of the chest was performed using the standard protocol during bolus administration of intravenous contrast. Multiplanar CT image reconstructions and MIPs were obtained to evaluate the vascular anatomy. RADIATION DOSE REDUCTION: This exam was performed according to the departmental dose-optimization program which includes automated exposure control, adjustment of the mA and/or kV according to patient size and/or use of iterative reconstruction technique. CONTRAST:  75mL OMNIPAQUE  IOHEXOL  350 MG/ML SOLN COMPARISON:  None Available. FINDINGS: Cardiovascular: Satisfactory opacification of the pulmonary arteries to the segmental  level. A mild amount of intraluminal low attenuation is seen within a segmental and subsegmental posterior lower lobe branch of the right pulmonary artery (axial CT images 94 through 98, CT series 8). Normal heart size without evidence of right heart strain (RV/LV ratio of 0.94). No pericardial effusion. Mediastinum/Nodes: No enlarged mediastinal, hilar, or axillary lymph nodes. Multiple small heterogeneous thyroid  nodules are seen within the right and left lobes of the thyroid  gland. The trachea and esophagus demonstrate no significant  findings. Lungs/Pleura: Lungs are clear. No pleural effusion or pneumothorax. Upper Abdomen: No acute abnormality. Musculoskeletal: No chest wall abnormality. No acute or significant osseous findings. Review of the MIP images confirms the above findings. IMPRESSION: 1. Mild amount of pulmonary embolism within a segmental and subsegmental posterior lower lobe branch of the right pulmonary artery. 2. Multiple small heterogeneous thyroid  nodules. Correlation with nonemergent thyroid  ultrasound is recommended. Electronically Signed   By: Suzen Dials M.D.   On: 02/24/2024 18:28   DG Chest 2 View Result Date: 02/24/2024 CLINICAL DATA:  chest pain EXAM: CHEST - 2 VIEW COMPARISON:  02/22/2022 FINDINGS: The heart size and mediastinal contours are within normal limits. Both lungs are clear. The visualized skeletal structures are unremarkable. IMPRESSION: No active cardiopulmonary disease. Electronically Signed   By: CHRISTELLA.  Shick M.D.   On: 02/24/2024 12:50    Microbiology: Results for orders placed or performed in visit on 10/20/23  GC/Chlamydia Probe Amp     Status: None   Collection Time: 10/20/23 11:56 AM   Specimen: Blood   UR  Result Value Ref Range Status   Chlamydia trachomatis, NAA Negative Negative Final   Neisseria Gonorrhoeae by PCR Negative Negative Final    Labs: CBC: Recent Labs  Lab 02/24/24 1207 02/25/24 0234 02/26/24 0335  WBC 8.0 6.3 6.7  HGB 11.5* 11.4* 11.2*  HCT 35.2* 35.2* 34.1*  MCV 82.1 81.5 80.8  PLT 394 370 367   Basic Metabolic Panel: Recent Labs  Lab 02/24/24 1207 02/25/24 0234  NA 137 138  K 3.5 3.9  CL 104 102  CO2 27 25  GLUCOSE 95 104*  BUN 9 9  CREATININE 0.84 0.78  CALCIUM 9.2 8.8*   Liver Function Tests: Recent Labs  Lab 02/24/24 1545 02/25/24 0234  AST 14* 17  ALT 8 7  ALKPHOS 64 59  BILITOT 0.7 0.6  PROT 7.4 7.1  ALBUMIN 4.3 3.9   CBG: No results for input(s): GLUCAP in the last 168 hours.  Discharge time spent: greater than  30 minutes.  Signed: AIDA CHO, MD [facility] 02/26/2024

## 2024-02-26 NOTE — Plan of Care (Signed)

## 2024-02-27 NOTE — Patient Instructions (Incomplete)
 Blood Clot in the Lung (Pulmonary Embolism): What to Know  A pulmonary embolism (PE) is when you get a sudden blockage or less blood flow in one or both of your lungs. This typically happens when a blood clot moves into the arteries of your lung. A blood clot is blood that has turned into a gel or solid. Most blockages come from deep vein thrombosis (DVT). This is when a blood clot forms in the vein of a leg or arm and moves to your lungs. PE is dangerous. It must be treated right away. What are the causes? In most cases, PE is caused by a blood clot that forms in a vein and moves to your lungs.  In rare cases, it may be caused by: Air. Fat. Part of a tumor. A tumor is a growth of cells or tissue that isn't normal. Other tissue that moves through your veins and into your lungs. What increases the risk? You may be more likely to get PE if: You get really hurt, such as from: Breaking a hip or leg. You're also more at risk if your leg is in a cast or splint. An injury to your spinal cord. You have a big surgery, such as: A hip or knee replacement. Surgery on parts of your nervous system. Surgery on your belly. You have certain conditions, such as: A stroke. A problem with how your blood clots. A long-term lung or heart disease. You have a soft tube called a central venous catheter. You're being treated for cancer. You take medicines that have estrogen in them. You smoke. You spend a lot of time sitting, such as if you travel over 6 hours. You may also be more likely to get PE if you're: Pregnant or just had a baby. Older than 70 years of age. Overweight. Not very active. Not able to move at all. What are the signs or symptoms? Symptoms may start all of a sudden. They may include: Feeling short of breath while moving or resting. Coughing, coughing up blood, or coughing up bloody mucus. Pain that gets worse with deep breaths in your: Chest. Back. Shoulder blade. A heartbeat  that's fast or not normal. Feeling light-headed or dizzy. Fainting. Feeling worried or nervous. Pain and swelling in a leg. This is a symptom of DVT. DVT can lead to PE. How is this diagnosed? PE may be diagnosed based on your medical history, an exam, and tests. Tests may include: Blood tests. An electrocardiogram (ECG). This checks how well your heart is working. A CT pulmonary angiogram. This checks blood flow in and around your lungs. A ventilation-perfusion scan. This measures air flow and blood flow to your lungs. An ultrasound to check for a DVT. How is this treated? Treatment may depend on what caused the PE, how likely you are to have bleeding, and other conditions you have. Treatment may be done to: Stop blood clots from forming. Stop the clot from getting bigger. Break apart the clot. Remove the clot. Treatment may include: Medicines to: Stop clots from forming and growing. You may be given blood thinners. Break apart clots. Procedures, such as: An embolectomy. This uses a tube to remove a clot. Catheter-directed thrombolysis. This uses medicine to get rid of a clot. Surgical embolectomy. This is a surgery to get rid of the clot. It's used in rare cases. You may need more than one treatment. Work with your provider to choose the best treatment program for you. Follow these instructions at home: Medicines  Take your medicines only as told. If you're taking blood thinners: Take your medicines as told. Take them at the same time each day. Talk with your provider before taking any products you can buy at the store. Do not do things that could hurt or bruise you. Be careful to avoid falls. Wear an alert bracelet or carry a card that says you take blood thinners. Know what foods and other medicines you can have with your medicines. General instructions Stay at a healthy weight. Ask your provider what weight is healthy for you. Do not smoke, vape, or use nicotine or  tobacco. Do not sit or lie down for a long time without moving. Tell your provider if you plan to travel. Make sure you can still take your medicine while you travel. Ask what things are safe for you to do at home. Ask when you can go back to work or school. Keep all follow-up visits. Your provider will closely watch how you're doing. Where to find more information To learn more, go to these websites: Centers for Disease Control and Prevention at [employer].no. Then: Click Health Topics A-Z. Type "blood clot" into the search box. American Lung Association (ALA): lung.org Contact a health care provider if: You miss a dose of your blood thinner. You have a fever. Get help right away if: You have: New or more pain, swelling, warmth, or redness in an arm or leg. Worse chest pain. A heartbeat that's fast or not normal. A very bad headache. Changes to your eyesight. A very bad fall or accident, or you hit your head. Blood in your pee or poop. A cut that won't stop bleeding. You feel short of breath, and it gets worse when you're moving or resting. You throw up or cough up blood. You feel light-headed or dizzy, and the feeling doesn't go away. You can't move your arms or legs. You're confused or have memory loss. These symptoms may be an emergency. Call 911 right away. Do not wait to see if the symptoms will go away. Do not drive yourself to the hospital. This information is not intended to replace advice given to you by your health care provider. Make sure you discuss any questions you have with your health care provider. Document Revised: 10/14/2022 Document Reviewed: 10/14/2022 Elsevier Patient Education  2024 [employer].

## 2024-02-29 ENCOUNTER — Encounter: Payer: Self-pay | Admitting: Nurse Practitioner

## 2024-02-29 ENCOUNTER — Telehealth: Payer: Self-pay | Admitting: Oncology

## 2024-02-29 ENCOUNTER — Telehealth: Payer: Self-pay

## 2024-02-29 ENCOUNTER — Ambulatory Visit: Admitting: Nurse Practitioner

## 2024-02-29 VITALS — BP 122/80 | HR 83 | Temp 98.4°F | Resp 14 | Ht 65.98 in | Wt 218.0 lb

## 2024-02-29 DIAGNOSIS — D573 Sickle-cell trait: Secondary | ICD-10-CM

## 2024-02-29 DIAGNOSIS — M12812 Other specific arthropathies, not elsewhere classified, left shoulder: Secondary | ICD-10-CM

## 2024-02-29 DIAGNOSIS — I10 Essential (primary) hypertension: Secondary | ICD-10-CM

## 2024-02-29 DIAGNOSIS — E042 Nontoxic multinodular goiter: Secondary | ICD-10-CM

## 2024-02-29 DIAGNOSIS — E66811 Obesity, class 1: Secondary | ICD-10-CM | POA: Diagnosis not present

## 2024-02-29 DIAGNOSIS — D508 Other iron deficiency anemias: Secondary | ICD-10-CM | POA: Diagnosis not present

## 2024-02-29 DIAGNOSIS — I871 Compression of vein: Secondary | ICD-10-CM | POA: Diagnosis not present

## 2024-02-29 DIAGNOSIS — I2699 Other pulmonary embolism without acute cor pulmonale: Secondary | ICD-10-CM | POA: Diagnosis not present

## 2024-02-29 DIAGNOSIS — D1622 Benign neoplasm of long bones of left lower limb: Secondary | ICD-10-CM

## 2024-02-29 MED ORDER — APIXABAN 5 MG PO TABS: 5.0000 mg | ORAL_TABLET | Freq: Two times a day (BID) | ORAL | 1 refills | Status: AC

## 2024-02-29 MED ORDER — TRAMADOL HCL 50 MG PO TABS: 50.0000 mg | ORAL_TABLET | Freq: Three times a day (TID) | ORAL | 0 refills | Status: AC | PRN

## 2024-02-29 NOTE — Assessment & Plan Note (Addendum)
 Acute and unprovoked. Is taking Eliquis , will send in 5 MG BID refills to work on [place] for approval. May need assistance with cost in future, consider PharmD referral. Recommend she scheduled follow-up with hematology ASAP, she has not seen since last year.  She will bring in FMLA forms for provider to fill out. Work note provided today.

## 2024-02-29 NOTE — Assessment & Plan Note (Signed)
 Acute, is scheduled to see ortho upcoming. Appreciate their input. Will send in short burst of Tramadol  to use only as needed for severe pain. Discussed at length with patient today. No refills.

## 2024-02-29 NOTE — Progress Notes (Addendum)
 BP 122/80 (BP Location: Left Arm, Patient Position: Sitting)   Pulse 83   Temp 98.4 F (36.9 C) (Oral)   Resp 14   Ht 5' 5.98 (1.676 m)   Wt 218 lb (98.9 kg)   LMP 02/08/2024 (Approximate)   SpO2 98%   BMI 35.20 kg/m    Subjective:    Patient ID: Kathleen [name], female    DOB: 06-Jul-1980, 43 y.o.   MRN: [id number]  HPI: Kathleen [name] is a 43 y.o. female  Chief Complaint  Patient presents with   Hospitalization Follow-up    Went for left shoulder pain but then found about the PE on right lung. Would like pain medication for the shoulder. See's neurosurgery on 03/23/2024. Orthopedics on 03/07/2024. Vein and vascular at 6 months.    Transition of Care Hospital Follow up.  Follow-up today for recent hospitalization on 02/24/24 for acute pulmonary embolism. Initially went to hospital for left shoulder pain which started 3-4 days prior. PE present to lower lobe branch of the right pulmonary artery.  CT also noted multiple small heterogenous thyroid  nodules with recommendation for thyroid  ultrasound. MRI left shoulder mild tendinosis of rotator cuff and subcortical cystic changes of the greater tuberosity. She reports ongoing pain to left shoulder that is causing her not to sleep. If lies flat feels better. Flexeril  is helping some with discomfort. Pain at worst is 10/10, does heating pad and OTC medications. Given morphine  in hospital which helped.  As does more activity she notices more pain.   Started on Eliquis  in the hospital, she reports cost will be $200 due to no PA. Has known PVD, follows with vascular.  History of stent to left lower extremity in 2013. Unprovoked PE. Has seen oncology in past for iron  infusions. Last infusion in August 2024 and last office visit with them was 09/30/22. Sickle Cell Trait present. Mother had Sickle Cell.  Hospital Course:   Kathleen [name] is a 43 y.o. female with medical history significant for hypertension, marijuana use, iron   deficiency anemia requiring IV iron  infusion in the past, abnormal thyroid  function test, PVD s/p stent placement to the left lower extremity in 2013, has been off of aspirin for years, who presented to the ED with chest pain and left shoulder pain.  Symptoms started about 3 to 4 days prior to admission.  Chest pain appeared to radiate to the left shoulder and left arm.   CTA chest    IMPRESSION: 1. Mild amount of pulmonary embolism within a segmental and subsegmental posterior lower lobe branch of the right pulmonary artery. 2. Multiple small heterogeneous thyroid  nodules. Correlation with nonemergent thyroid  ultrasound is recommended.       She was admitted to the hospital for acute pulmonary embolism and was started on IV heparin  drip.     Assessment and Plan:     Acute right-sided pulmonary embolism: She will be discharged on Eliquis .  Outpatient follow-up with hematologist was recommended. She was evaluated by vascular surgery team.  No intervention was required.  Outpatient follow-up with vascular surgeon in 6 months recommended.     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  Outpatient follow-up with neurosurgeon and orthopedic surgeon was recommended. Tylenol  and Flexeril  as needed for pain.     PVD with stent placement to the left lower extremity  in 2013: She said she has been off of aspirin for many years.  Patient has been started on Eliquis .  Recommended follow-up with vascular surgeon for ongoing management.     Thyroid  nodules: Patient said she is aware of history of thyroid  nodules.  She also mentioned that she had history of mildly low TSH but last TSH done 3 months ago was normal.  Outpatient follow-up with PCP recommended.     Chronic iron  deficiency anemia: H&H stable.   Follow-up with PCP or hematologist for iron  infusions as needed.     Elevated blood pressure: Patient said she is not a known hypertensive and she believes that blood pressure was elevated because of pain.  Outpatient follow-up with PCP recommended.     Marijuana use     Her condition has improved and she is deemed stable for discharge to home today.  Discharge plan was discussed with the patient and her friend at the bedside.  Hospital/Facility: [facility] D/C Physician: Dr. Jens D/C Date: 02/26/24  Records Requested: 02/29/24 Records Received: 02/29/24 Records Reviewed: 02/29/24  Diagnoses on Discharge: Acute pulmonary embolism  Date of interactive Contact within 48 hours of discharge:  Contact was through: performed today  Date of 7 day or 14 day face-to-face visit:    within 7 days  Outpatient Encounter Medications as of 02/29/2024  Medication Sig   acetaminophen  (TYLENOL ) 325 MG tablet Take 2 tablets (650 mg total) by mouth every 6 (six) hours as needed for mild pain (pain score 1-3) or moderate pain (pain score 4-6).   apixaban  (ELIQUIS ) 5 MG TABS tablet Take 1 tablet (5 mg total) by mouth 2 (two) times daily.   cyclobenzaprine  (FLEXERIL ) 5 MG tablet Take 1 tablet (5 mg total) by mouth 3 (three) times daily as needed for muscle spasms.   EPINEPHrine  0.3 mg/0.3 mL IJ SOAJ injection Inject 0.3 mg into the muscle as needed for anaphylaxis.   traMADol  (ULTRAM ) 50 MG tablet Take 1 tablet (50 mg total) by mouth every 8 (eight) hours as needed for up to 5 days.   [DISCONTINUED] APIXABAN  (ELIQUIS ) VTE STARTER PACK (10MG  AND 5MG ) Take as directed on package: start with two-5mg  tablets twice daily for 7 days. On day 8, switch to one-5mg  tablet twice daily.   No facility-administered encounter medications on file as of 02/29/2024.    Diagnostic Tests Reviewed/Disposition:     Latest Ref Rng & Units 02/26/2024    3:35 AM 02/25/2024    2:34 AM 02/24/2024   12:07 PM  CBC  WBC 4.0 -  10.5 K/uL 6.7  6.3  8.0   Hemoglobin 12.0 - 15.0 g/dL 88.7  88.5  88.4   Hematocrit 36.0 - 46.0 % 34.1  35.2  35.2   Platelets 150 - 400 K/uL 367  370  394        Latest Ref Rng & Units 02/25/2024    2:34 AM 02/24/2024    3:45 PM 02/24/2024   12:07 PM  CMP  Glucose 70 - 99 mg/dL 895   95   BUN 6 - 20 mg/dL 9   9   Creatinine 9.55 - 1.00 mg/dL 9.21   9.15   Sodium 864 - 145 mmol/L 138   137   Potassium 3.5 - 5.1 mmol/L 3.9   3.5   Chloride 98 - 111 mmol/L 102   104   CO2 22 - 32 mmol/L 25   27   Calcium 8.9 - 10.3 mg/dL 8.8   9.2  Total Protein 6.5 - 8.1 g/dL 7.1  7.4    Total Bilirubin 0.0 - 1.2 mg/dL 0.6  0.7    Alkaline Phos 38 - 126 U/L 59  64    AST 15 - 41 U/L 17  14    ALT 0 - 44 U/L 7  8     Consults: Vascular  Discharge Instructions: Follow-up with PCP in 1 to 2 weeks Follow-up with [place] neurosurgery within 1 month of discharge Follow-up with [place] orthopedic surgery within 2 weeks of discharge Follow-up with hematologist within 3 months of discharge  Disease/illness Education: Reviewed at length with patient today in office  Home Health/Community Services Discussions/Referrals: None  Establishment or re-establishment of referral orders for community resources: None  Discussion with other health care providers: Reviewed all recent notes in chart.  Assessment and Support of treatment regimen adherence: Reviewed at length with patient today in office  Appointments Coordinated with: Reviewed at length with patient today in office  Education for self-management, independent living, and ADLs:  Reviewed at length with patient today in office  Relevant past medical, surgical, family and social history reviewed and updated as indicated. Interim medical history since our last visit reviewed. Allergies and medications reviewed and updated.  Review of Systems  Constitutional:  Negative for activity change, appetite change, chills, fatigue and fever.   Respiratory:  Negative for cough, chest tightness, shortness of breath and wheezing.   Cardiovascular:  Negative for chest pain, palpitations and leg swelling.  Gastrointestinal: Negative.   Endocrine: Negative.   Musculoskeletal:  Positive for arthralgias.  Neurological: Negative.   Psychiatric/Behavioral: Negative.      Per HPI unless specifically indicated above     Objective:    BP 122/80 (BP Location: Left Arm, Patient Position: Sitting)   Pulse 83   Temp 98.4 F (36.9 C) (Oral)   Resp 14   Ht 5' 5.98 (1.676 m)   Wt 218 lb (98.9 kg)   LMP 02/08/2024 (Approximate)   SpO2 98%   BMI 35.20 kg/m   Wt Readings from Last 3 Encounters:  02/29/24 218 lb (98.9 kg)  02/24/24 215 lb 9.6 oz (97.8 kg)  10/20/23 217 lb 6.4 oz (98.6 kg)    Physical Exam Vitals and nursing note reviewed.  Constitutional:      General: She is awake. She is not in acute distress.    Appearance: She is well-developed and well-groomed. She is obese. She is not ill-appearing or toxic-appearing.  HENT:     Head: Normocephalic.     Right Ear: Hearing and external ear normal.     Left Ear: Hearing and external ear normal.  Eyes:     General: Lids are normal.        Right eye: No discharge.        Left eye: No discharge.     Conjunctiva/sclera: Conjunctivae normal.     Pupils: Pupils are equal, round, and reactive to light.  Neck:     Thyroid : No thyromegaly.     Vascular: No carotid bruit.  Cardiovascular:     Rate and Rhythm: Normal rate and regular rhythm.     Heart sounds: Normal heart sounds. No murmur heard.    No gallop.  Pulmonary:     Effort: Pulmonary effort is normal. No accessory muscle usage or respiratory distress.     Breath sounds: Normal breath sounds.  Abdominal:     General: Bowel sounds are normal. There is no distension.  Palpations: Abdomen is soft.     Tenderness: There is no abdominal tenderness.  Musculoskeletal:     Right shoulder: Normal.     Left shoulder:  Tenderness present. No swelling, bony tenderness or crepitus. Normal range of motion. Decreased strength.     Cervical back: Normal range of motion and neck supple.     Right lower leg: No edema.     Left lower leg: No edema.  Lymphadenopathy:     Cervical: No cervical adenopathy.  Skin:    General: Skin is warm and dry.  Neurological:     Mental Status: She is alert and oriented to person, place, and time.     Deep Tendon Reflexes: Reflexes are normal and symmetric.     Reflex Scores:      Brachioradialis reflexes are 2+ on the right side and 2+ on the left side.      Patellar reflexes are 2+ on the right side and 2+ on the left side. Psychiatric:        Attention and Perception: Attention normal.        Mood and Affect: Mood normal.        Speech: Speech normal.        Behavior: Behavior normal. Behavior is cooperative.        Thought Content: Thought content normal.     Results for orders placed or performed during the hospital encounter of 02/24/24  Basic metabolic panel   Collection Time: 02/24/24 12:07 PM  Result Value Ref Range   Sodium 137 135 - 145 mmol/L   Potassium 3.5 3.5 - 5.1 mmol/L   Chloride 104 98 - 111 mmol/L   CO2 27 22 - 32 mmol/L   Glucose, Bld 95 70 - 99 mg/dL   BUN 9 6 - 20 mg/dL   Creatinine, Ser 9.15 0.44 - 1.00 mg/dL   Calcium 9.2 8.9 - 89.6 mg/dL   GFR, Estimated >39 >39 mL/min   Anion gap 7 5 - 15  CBC   Collection Time: 02/24/24 12:07 PM  Result Value Ref Range   WBC 8.0 4.0 - 10.5 K/uL   RBC 4.29 3.87 - 5.11 MIL/uL   Hemoglobin 11.5 (L) 12.0 - 15.0 g/dL   HCT 64.7 (L) 63.9 - 53.9 %   MCV 82.1 80.0 - 100.0 fL   MCH 26.8 26.0 - 34.0 pg   MCHC 32.7 30.0 - 36.0 g/dL   RDW 85.3 88.4 - 84.4 %   Platelets 394 150 - 400 K/uL   nRBC 0.0 0.0 - 0.2 %  Troponin T, High Sensitivity   Collection Time: 02/24/24 12:07 PM  Result Value Ref Range   Troponin T High Sensitivity <15 0 - 19 ng/L  Hepatic function panel   Collection Time: 02/24/24  3:45  PM  Result Value Ref Range   Total Protein 7.4 6.5 - 8.1 g/dL   Albumin 4.3 3.5 - 5.0 g/dL   AST 14 (L) 15 - 41 U/L   ALT 8 0 - 44 U/L   Alkaline Phosphatase 64 38 - 126 U/L   Total Bilirubin 0.7 0.0 - 1.2 mg/dL   Bilirubin, Direct 0.3 (H) 0.0 - 0.2 mg/dL   Indirect Bilirubin 0.4 0.3 - 0.9 mg/dL  Lipase, blood   Collection Time: 02/24/24  3:45 PM  Result Value Ref Range   Lipase 49 11 - 51 U/L  Troponin T, High Sensitivity   Collection Time: 02/24/24  3:45 PM  Result Value Ref Range  Troponin T High Sensitivity <15 0 - 19 ng/L  D-dimer, quantitative   Collection Time: 02/24/24  4:36 PM  Result Value Ref Range   D-Dimer, Quant 0.64 (H) 0.00 - 0.50 ug/mL-FEU  POC urine preg, ED   Collection Time: 02/24/24  5:49 PM  Result Value Ref Range   Preg Test, Ur NEGATIVE NEGATIVE  APTT   Collection Time: 02/24/24  7:41 PM  Result Value Ref Range   aPTT 28 24 - 36 seconds  Protime-INR   Collection Time: 02/24/24  7:41 PM  Result Value Ref Range   Prothrombin Time 13.3 11.4 - 15.2 seconds   INR 1.0 0.8 - 1.2  CBC   Collection Time: 02/25/24  2:34 AM  Result Value Ref Range   WBC 6.3 4.0 - 10.5 K/uL   RBC 4.32 3.87 - 5.11 MIL/uL   Hemoglobin 11.4 (L) 12.0 - 15.0 g/dL   HCT 64.7 (L) 63.9 - 53.9 %   MCV 81.5 80.0 - 100.0 fL   MCH 26.4 26.0 - 34.0 pg   MCHC 32.4 30.0 - 36.0 g/dL   RDW 85.4 88.4 - 84.4 %   Platelets 370 150 - 400 K/uL   nRBC 0.0 0.0 - 0.2 %  Comprehensive metabolic panel   Collection Time: 02/25/24  2:34 AM  Result Value Ref Range   Sodium 138 135 - 145 mmol/L   Potassium 3.9 3.5 - 5.1 mmol/L   Chloride 102 98 - 111 mmol/L   CO2 25 22 - 32 mmol/L   Glucose, Bld 104 (H) 70 - 99 mg/dL   BUN 9 6 - 20 mg/dL   Creatinine, Ser 9.21 0.44 - 1.00 mg/dL   Calcium 8.8 (L) 8.9 - 10.3 mg/dL   Total Protein 7.1 6.5 - 8.1 g/dL   Albumin 3.9 3.5 - 5.0 g/dL   AST 17 15 - 41 U/L   ALT 7 0 - 44 U/L   Alkaline Phosphatase 59 38 - 126 U/L   Total Bilirubin 0.6 0.0 - 1.2  mg/dL   GFR, Estimated >39 >39 mL/min   Anion gap 11 5 - 15  Heparin  level (unfractionated)   Collection Time: 02/25/24  2:34 AM  Result Value Ref Range   Heparin  Unfractionated <0.10 (L) 0.30 - 0.70 IU/mL  CBC   Collection Time: 02/26/24  3:35 AM  Result Value Ref Range   WBC 6.7 4.0 - 10.5 K/uL   RBC 4.22 3.87 - 5.11 MIL/uL   Hemoglobin 11.2 (L) 12.0 - 15.0 g/dL   HCT 65.8 (L) 63.9 - 53.9 %   MCV 80.8 80.0 - 100.0 fL   MCH 26.5 26.0 - 34.0 pg   MCHC 32.8 30.0 - 36.0 g/dL   RDW 85.3 88.4 - 84.4 %   Platelets 367 150 - 400 K/uL   nRBC 0.0 0.0 - 0.2 %      Assessment & Plan:   Problem List Items Addressed This Visit       Cardiovascular and Mediastinum   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  Recommend she wear compression hose daily at work, as notices this more on 12 hours shifts.  May obtain compression hose at [facility]  Continue to collaborate with vascular. Returns to see them in new year.      Relevant Medications   apixaban  (ELIQUIS ) 5 MG TABS tablet   Essential hypertension   Chronic, stable.  BP at goal today. Recommend she monitor BP at least a few mornings a week at home and  document.  DASH diet at home.  Will continue diet focus at this time. Labs today: CBC, CMP, TSH.       Relevant Medications   apixaban  (ELIQUIS ) 5 MG TABS tablet   Other Relevant Orders   CBC with Differential/Platelet   Comprehensive metabolic panel with GFR   TSH   Acute pulmonary embolism (HCC) - Primary   Acute and unprovoked. Is taking Eliquis , will send in 5 MG BID refills to work on PA for approval. May need assistance with cost in future, consider PharmD referral. Recommend she scheduled follow-up with hematology ASAP, she has not seen since last year.  She will bring in FMLA forms for provider to fill out. Work note provided today.      Relevant Medications   apixaban  (ELIQUIS ) 5 MG TABS tablet     Endocrine   Multiple thyroid  nodules   Noted on recent CT in  hospital, will obtain thyroid  ultrasound per recommendation. Discussed with patient today.      Relevant Orders   US  THYROID      Musculoskeletal and Integument   Rotator cuff arthropathy, left   Acute, is scheduled to see ortho upcoming. Appreciate their input. Will send in short burst of Tramadol  to use only as needed for severe pain. Discussed at length with patient today. No refills.        Other   Obesity (BMI 30.0-34.9)   BMI 35.20. Recommended eating smaller high protein, low fat meals more frequently and exercising 30 mins a day 5 times a week with a goal of 10-15lb weight loss in the next 3 months. Patient voiced their understanding and motivation to adhere to these recommendations.       Iron  deficiency anemia   Chronic, stable.  Followed by hematology in past.  Last notes reviewed and labs. Recheck labs today.  Has heavier cycles and is scheduled to see GYN. Recommend she follow-up with hematology ASAP.      Relevant Orders   Ferritin   Folate   Iron    CBC with Differential/Platelet     Follow up plan: Return in about 4 weeks (around 03/28/2024) for Pulmonary Emboli.

## 2024-02-29 NOTE — Assessment & Plan Note (Signed)
 Chronic, ongoing issue.  Recommend she wear compression hose daily at work, as notices this more on 12 hours shifts.  May obtain compression hose at [facility].  Continue to collaborate with vascular. Returns to see them in new year.

## 2024-02-29 NOTE — Transitions of Care (Post Inpatient/ED Visit) (Signed)
   02/29/2024  Name: Kathleen [name] MRN: [id number] DOB: 1980-06-08  Today's TOC FU Call Status: Today's TOC FU Call Status:: Successful TOC FU Call Completed TOC FU Call Complete Date: 02/29/24  Patient's Name and Date of Birth confirmed. DOB, Name  Transition Care Management Follow-up Telephone Call Date of Discharge: 02/26/24 Discharge Facility: [facility]) Type of Discharge: Inpatient Admission Primary Inpatient Discharge Diagnosis:: PE How have you been since you were released from the hospital?: Better Any questions or concerns?: No  Items Reviewed: Did you receive and understand the discharge instructions provided?: Yes Medications obtained,verified, and reconciled?: Yes (Medications Reviewed) Any new allergies since your discharge?: No Dietary orders reviewed?: Yes Do you have support at home?: Yes People in Home [RPT]: friend(s), child(ren), adult  Medications Reviewed Today: Medications Reviewed Today     Reviewed by Emmitt Pan, LPN (Licensed Practical Nurse) on 02/29/24 at [phone]  Med List Status: <None>   Medication Order Taking? Sig Documenting Provider Last Dose Status Informant  acetaminophen  (TYLENOL ) 325 MG tablet [id number] Yes Take 2 tablets (650 mg total) by mouth every 6 (six) hours as needed for mild pain (pain score 1-3) or moderate pain (pain score 4-6). Jens Durand, MD  Active   APIXABAN  (ELIQUIS ) VTE STARTER PACK (10MG  AND 5MG ) [id number] Yes Take as directed on package: start with two-5mg  tablets twice daily for 7 days. On day 8, switch to one-5mg  tablet twice daily. Jens Durand, MD  Active   cyclobenzaprine  (FLEXERIL ) 5 MG tablet [id number] Yes Take 1 tablet (5 mg total) by mouth 3 (three) times daily as needed for muscle spasms. Jens Durand, MD  Active   EPINEPHrine  0.3 mg/0.3 mL IJ SOAJ injection [id number] Yes Inject 0.3 mg into the muscle as needed for anaphylaxis. Jens Durand, MD  Active              Home Care and Equipment/Supplies: Were Home Health Services Ordered?: NA Any new equipment or medical supplies ordered?: NA  Functional Questionnaire: Do you need assistance with bathing/showering or dressing?: No Do you need assistance with meal preparation?: No Do you need assistance with eating?: No Do you have difficulty maintaining continence: No Do you need assistance with getting out of bed/getting out of a chair/moving?: No Do you have difficulty managing or taking your medications?: No  Follow up appointments reviewed: PCP Follow-up appointment confirmed?: Yes Date of PCP follow-up appointment?: 02/29/24 Follow-up Provider: [facility] Follow-up appointment confirmed?: Yes Date of Specialist follow-up appointment?: 03/23/24 Follow-Up Specialty Provider:: Neuro Do you need transportation to your follow-up appointment?: No Do you understand care options if your condition(s) worsen?: Yes-patient verbalized understanding    SIGNATURE Pan Emmitt, LPN [facility] Nurse Health Advisor Direct Dial [phone]

## 2024-02-29 NOTE — Assessment & Plan Note (Signed)
 Chronic, stable.  BP at goal today. Recommend she monitor BP at least a few mornings a week at home and document.  DASH diet at home.  Will continue diet focus at this time. Labs today: CBC, CMP, TSH.

## 2024-02-29 NOTE — Telephone Encounter (Signed)
 Noted

## 2024-02-29 NOTE — Assessment & Plan Note (Signed)
 Chronic, stable.  Followed by hematology in past.  Last notes reviewed and labs. Recheck labs today.  Has heavier cycles and is scheduled to see GYN. Recommend she follow-up with hematology ASAP.

## 2024-02-29 NOTE — Assessment & Plan Note (Signed)
 BMI 35.20. Recommended eating smaller high protein, low fat meals more frequently and exercising 30 mins a day 5 times a week with a goal of 10-15lb weight loss in the next 3 months. Patient voiced their understanding and motivation to adhere to these recommendations.

## 2024-02-29 NOTE — Assessment & Plan Note (Signed)
 Noted on recent CT in hospital, will obtain thyroid  ultrasound per recommendation. Discussed with patient today.

## 2024-02-29 NOTE — Telephone Encounter (Signed)
 Pt called and stated she needs to schedule a hospital follow up. That her PCP told her to call us . Please advise. Pt was last seen 09/30/2022

## 2024-03-01 ENCOUNTER — Ambulatory Visit: Payer: Self-pay | Admitting: Nurse Practitioner

## 2024-03-01 DIAGNOSIS — E041 Nontoxic single thyroid nodule: Secondary | ICD-10-CM

## 2024-03-01 LAB — COMPREHENSIVE METABOLIC PANEL WITH GFR
ALT: 7 IU/L (ref 0–32)
AST: 10 IU/L (ref 0–40)
Albumin: 4.5 g/dL (ref 3.9–4.9)
Alkaline Phosphatase: 62 IU/L (ref 41–116)
BUN/Creatinine Ratio: 13 (ref 9–23)
BUN: 12 mg/dL (ref 6–24)
Bilirubin Total: 0.5 mg/dL (ref 0.0–1.2)
CO2: 24 mmol/L (ref 20–29)
Calcium: 9.5 mg/dL (ref 8.7–10.2)
Chloride: 102 mmol/L (ref 96–106)
Creatinine, Ser: 0.9 mg/dL (ref 0.57–1.00)
Globulin, Total: 2.8 g/dL (ref 1.5–4.5)
Glucose: 92 mg/dL (ref 70–99)
Potassium: 4 mmol/L (ref 3.5–5.2)
Sodium: 138 mmol/L (ref 134–144)
Total Protein: 7.3 g/dL (ref 6.0–8.5)
eGFR: 82 mL/min/1.73 (ref >59)

## 2024-03-01 LAB — CBC WITH DIFFERENTIAL/PLATELET
Basophils Absolute: 0.1 x10E3/uL (ref 0.0–0.2)
Basos: 1 %
EOS (ABSOLUTE): 0.2 x10E3/uL (ref 0.0–0.4)
Eos: 3 %
Hematocrit: 37.4 % (ref 34.0–46.6)
Hemoglobin: 11.9 g/dL (ref 11.1–15.9)
Immature Grans (Abs): 0 x10E3/uL (ref 0.0–0.1)
Immature Granulocytes: 0 %
Lymphocytes Absolute: 2.3 x10E3/uL (ref 0.7–3.1)
Lymphs: 44 %
MCH: 27 pg (ref 26.6–33.0)
MCHC: 31.8 g/dL (ref 31.5–35.7)
MCV: 85 fL (ref 79–97)
Monocytes Absolute: 0.4 x10E3/uL (ref 0.1–0.9)
Monocytes: 7 %
Neutrophils Absolute: 2.3 x10E3/uL (ref 1.4–7.0)
Neutrophils: 45 %
Platelets: 343 x10E3/uL (ref 150–450)
RBC: 4.41 x10E6/uL (ref 3.77–5.28)
RDW: 14.1 % (ref 11.7–15.4)
WBC: 5.2 x10E3/uL (ref 3.4–10.8)

## 2024-03-01 LAB — IRON: Iron: 42 ug/dL (ref 27–159)

## 2024-03-01 LAB — FOLATE: Folate: <2 ng/mL — ABNORMAL LOW (ref >3.0)

## 2024-03-01 LAB — TSH: TSH: 1.13 u[IU]/mL (ref 0.450–4.500)

## 2024-03-01 LAB — FERRITIN: Ferritin: 11 ng/mL — ABNORMAL LOW (ref 15–150)

## 2024-03-01 MED ORDER — FOLIC ACID 1 MG PO TABS: 1.0000 mg | ORAL_TABLET | Freq: Every day | ORAL | 1 refills | Status: AC

## 2024-03-01 NOTE — Progress Notes (Signed)
 Contacted via MyChart  Good afternoon Doris, your labs have returned: - CBC is showing better hemoglobin and hematocrit levels, but ferritin and folate are low. Iron  on lower side of normal. Please ensure to schedule with hematology as we discussed and if can tolerate I recommend taking an iron  supplement daily like Vitron or Slow Fe over the counter. I am sending in some Folic Acid  to take. - Kidney function, creatinine and eGFR, remains normal, as is liver function, AST and ALT.  Any questions? Keep being amazing!!  Thank you for allowing me to participate in your care.  I appreciate you. Kindest regards, Kathlyn Leachman

## 2024-03-02 ENCOUNTER — Encounter: Payer: Self-pay | Admitting: Nurse Practitioner

## 2024-03-06 NOTE — Telephone Encounter (Signed)
 See me on 12/15. Labs tbd after I see her

## 2024-03-07 ENCOUNTER — Ambulatory Visit (INDEPENDENT_AMBULATORY_CARE_PROVIDER_SITE_OTHER)

## 2024-03-07 DIAGNOSIS — M12812 Other specific arthropathies, not elsewhere classified, left shoulder: Secondary | ICD-10-CM

## 2024-03-07 DIAGNOSIS — M7582 Other shoulder lesions, left shoulder: Secondary | ICD-10-CM | POA: Diagnosis not present

## 2024-03-07 NOTE — Progress Notes (Signed)
 Orthopaedic Surgery New Patient Visit   History of Present Illness: The patient is a 43 y.o. female seen in clinic for 4-week history of left shoulder pain.  Pain is located at the posterior aspect of the left side of the neck, along upper trapezius/scapula, into shoulder, and along lateral/posterior aspect of upper arm.  Patient states does not go beyond elbow into forearm or hand.  Describes as a throbbing pain.  6/10 in severity.  Exacerbated with reaching in front or overhead.  Exacerbated with driving.  Does feel that pain reduces when she applies pressure such as lying flat in her bed.  Denies arm weakness.  Has taken over-the-counter ibuprofen and applied heat with minimal symptom improvement.  On third day of symptoms became severe, went to [facility] ED on 02/24/2024 for further evaluation.  Workup, CTA of chest, revealed incidental finding of PE in right lung (known PVD, history of stent in LLE in 2013).  Patient also went underwent an MRI of the cervical spine and of the left shoulder.  Patient was discharged on Eliquis  for 6 months and has a follow-up with the vascular surgeon in May 2026.  Patient was also given Tylenol  and Flexeril  for pain relief, advised to follow-up with neurosurgery and orthopedic surgery for further management of neck and shoulder pathology.    Patient saw primary care provider, Jolene Cannady, NP with [facility], on 02/29/2024.  Due to patient's continued pain, PCP prescribed tramadol .  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 Was provided work excuse by hospital through 03/13/2024, and then was provided work excuse by PCP through 03/23/2024 (the date of patient's neurosurgery appointment with [facility]).      Past Medical, Social and Family History: Past Medical History:  Diagnosis Date   Allergy    Anemia    Hypertension    Past Surgical History:  Procedure Laterality  Date   STENT PLACEMENT VASCULAR ([facility] HX)     TUBAL LIGATION     No Known Allergies Current Outpatient Medications on File Prior to Visit  Medication Sig Dispense Refill   acetaminophen  (TYLENOL ) 325 MG tablet Take 2 tablets (650 mg total) by mouth every 6 (six) hours as needed for mild pain (pain score 1-3) or moderate pain (pain score 4-6).     apixaban  (ELIQUIS ) 5 MG TABS tablet Take 1 tablet (5 mg total) by mouth 2 (two) times daily. 180 tablet 1   cyclobenzaprine  (FLEXERIL ) 5 MG tablet Take 1 tablet (5 mg total) by mouth 3 (three) times daily as needed for muscle spasms. 20 tablet 0   EPINEPHrine  0.3 mg/0.3 mL IJ SOAJ injection Inject 0.3 mg into the muscle as needed for anaphylaxis. 1 each 0   folic acid  (FOLVITE ) 1 MG tablet Take 1 tablet (1 mg total) by mouth daily. 90 tablet 1   No current facility-administered medications on file prior to visit.   Social History   Tobacco Use   Smoking status: Never   Smokeless tobacco: Never  Vaping Use   Vaping status: Never Used  Substance Use Topics   Alcohol use: Yes    Alcohol/week: 3.0 standard drinks of alcohol    Types: 1 Glasses of wine, 2 Shots of liquor per week   Drug use: Yes    Types: Marijuana      I have reviewed past medical, surgical, social and family history, medications and allergies as documented in the EMR.  Review  of Systems - A ROS was performed including pertinent positives and negatives as documented in the HPI.     Physical Exam:  General/Constitutional: NAD Vascular: No edema, swelling or tenderness, except as noted in detailed exam Integumentary: No impressive skin lesions present, except as noted in detailed exam Neuro/Psych: Normal mood and affect, oriented to person, place and time Musculoskeletal: Normal, except as noted in detailed exam and in HPI   Focused Orthopaedic Examination:  Neck focused exam: Palpation: non-tender to palpation about the mid-line spine. Moderate tenderness with  palpation over the left sided cervical paraspinal musculature and to the upper portion of the left trapezius. Palpable muscle tightness. ROM: within normal limits in flexion, extension, rotation and side-bending. Mild pain with extension; describes as pulling sensation over posterior aspect of left shoulder. Spurling's negative   Shoulder focused exam:    RIGHT LEFT  Scapula Atrophy Negative Negative   Winging Negative Negative  Rotator cuff Supraspinatus 5/5 5/5   Infraspinatus 5/5 5/5   Subscapularis 5/5 5/5  AROM/PROM (degrees) FF 0-170 / 0-170 0-170 / 0-170   Abduction 0-170 / 0-170 0-170 / 0-170   ER0 0-60 / 0-60 0-60 / 0-60   IR(back) T10 / T10 T12 / T12  Palpation (pain): AC negative Positive (minimal)   Biceps negative negative   Coracoid negative negative  Special Tests: O'Briens Not performed Positive Pain, no weakness   Mayo-shear Not performed Positive Pain, no weakness   Cross body Adduction Not performed negative   Speeds  Not performed negative   Jobe's Negative Positive for pain   Neer negative Positive   Hawkins Not performed Positive    Belly Press Not performed negative   Hornblower's Not performed negative  Other: Sulcus sign negative negative   Lateral deltoid 5/5 5/5    Vascular/Lymphatic: Fingers warm and well perfused with 2+ radial pulse.    Neurologic: Sensation intact to the Median, Ulnar and Radial nerve distribution of the hand. Sensation intact to lateral deltoid (axillary nerve).     XR Left Shoulder Imaging: X-rays of the Left Shoulder including 3-views (Grashey, Y-View, Axillary) obtained 02/25/2024 at [facility] were reviewed personally by me.  Per my independent interpretation these images show no acute fracture.  No dislocation.  Mild degenerative changes noted about the acromioclavicular joint.  Minimal downward sloping acromion.  No visible soft tissue abnormality.  XR Cervical Spine Imaging: X-rays of the Cervical Spine including 3-views  (AP, Lateral, Odontoid) obtained 02/25/2024 at [facility] were reviewed personally by me.  Per my independent interpretation these images show no acute fracture.  No dislocation.  Osteophyte formation on anterior aspect of vertebral bodies over C5-C6 and C6-C7 with associated endplate changes. No visible soft tissue abnormality.  Advanced left shoulder MRI imaging: MRI images of the left shoulder were obtained at [facility] on 02/25/2024 and were available for my review today.  Per my interpretation, there are no fractures or dislocations.  Cyst noted within the humeral head.  Mild signal intensity noted within the superior rotator cuff.  No obvious rotator cuff tear.  Long head biceps tendon intact with fluid noted within the long head biceps tendon sheath.  Degenerative changes noted about the acromioclavicular joint.  Radiology Read:  MRI of Left Shoulder without contrast on 02/25/2024 IMPRESSION: 1. Mild tendinosis of the supraspinatus and infraspinatus tendons. 2. Subcortical cystic changes of the greater tuberosity, likely secondary to chronic rotator cuff pathology. 3. Mild arthropathy of the acromioclavicular joint.  MRI of Cervical Spine without contrast on  02/25/2024 IMPRESSION: 1. Moderate right foraminal stenosis at C4-C5. 2. Mild foraminal stenosis on the right at C3-C4 and C5-C6 and the left at C6-C7. 3. Small posterior disc-osteophyte complexes at C5-C6 and C6-C7 without significant canal stenosis.  Assessment:  Left Shoulder rotator cuff tendinitis Cervical spondylosis  Plan:  Patient was seen and examined in office today. We reviewed patient's history, examination, and imaging in detail. Based on information available for this encounter, patient with 4-week history of left shoulder pain.  No known precipitating injury/trauma.  Pain located over left upper trapezius, into shoulder joint, and lateral/posterior aspect of left upper arm.  Patient evaluated at [facility] ED/hospital.  Underwent  cervical spine and left shoulder x-ray, as well as MRI without contrast.  Cervical imaging revealed mild to moderate foraminal stenosis, more severe on the right side.  Shoulder imaging revealed mild rotator cuff tendinosis and mild AC joint arthropathy.  Patient on physical exam with no limitation regarding range of motion or strength.  Mild pain/discomfort with rotator cuff evaluation.  Discussed suspected diagnosis of left shoulder pain being multi-factorial; due to rotator cuff tendinitis, as well as left trapezius strain, as well as possible cervical involvement.  Recommended conservative management of rest, over-the-counter analgesics such as Tylenol , referral for formal course of physical therapy, and subacromial corticosteroid injection.  Patient interested in physical therapy at this time.  Patient declined injection for now.  Did discuss avoidance of NSAID usage due to patient currently being on Eliquis  for PE management.  Recommended patient keep neurosurgery appointment on 12/17 for further discussion regarding cervical MRI findings and if any of current symptoms could be due to cervical spine pathology.    Patient scheduled to return to clinic in 8 weeks for reevaluation.  Advised patient she could return sooner if she changed her mind about subacromial cortisone injection.  Also advised patient to return sooner/call office if any new/worsening symptoms or concerns.  All questions, concerns and comments were addressed to the best of my ability.  Follow-up: 8 weeks; sooner if any new/worsening symptoms or concerns.  I discussed with the patient today that I will be transitioning out of my role within the near future. In order to provide appropriate continuity of care, we offered the patient options for follow up regarding their orthopedic concerns. Patient has chosen to follow-up with [facility].  Patient may reach out to our office if there are any difficulties in scheduling follow up care. The  patient understands who to contact for future orthopedic concerns and has contact information for the receiving practice.   Arlyss GEANNIE Schneider, DO Orthopedic Surgery & Sports Medicine [place]   This document was dictated using Dragon voice recognition software. A reasonable attempt at proof reading has been made to minimize errors.

## 2024-03-08 ENCOUNTER — Ambulatory Visit
Admission: RE | Admit: 2024-03-08 | Discharge: 2024-03-08 | Disposition: A | Source: Ambulatory Visit | Attending: Nurse Practitioner | Admitting: Nurse Practitioner

## 2024-03-08 DIAGNOSIS — E042 Nontoxic multinodular goiter: Secondary | ICD-10-CM | POA: Insufficient documentation

## 2024-03-08 DIAGNOSIS — E041 Nontoxic single thyroid nodule: Secondary | ICD-10-CM | POA: Insufficient documentation

## 2024-03-08 NOTE — Progress Notes (Signed)
 Contacted via MyChart  Good evening Kathleen [name], your thyroid  ultrasound returned and shows thyroid  is a little enlarged and there is a nodule to left side which they recommend repeating ultrasound in one year to ensure no changes in size. We will continue to monitor thyroid  levels on blood work at visits too. Any questions?

## 2024-03-14 ENCOUNTER — Ambulatory Visit: Admitting: Physician Assistant

## 2024-03-16 NOTE — Progress Notes (Unsigned)
 Referring Physician:  Valerio Kathleen DASEN, NP [address] [place],  [place] [address]  Primary Physician:  Valerio Kathleen DASEN, NP  History of Present Illness: 03/16/2024 [name] is here today with a chief complaint of ***  Neck pain that radiates down left arm to the elbow causing a throbbing pain.   Duration: 6 weeks Location: *** Quality: *** Severity: ***  Precipitating: aggravated by *** Modifying factors: made better by *** Weakness: none Timing: *** Bowel/Bladder Dysfunction: none  Conservative measures:  Physical therapy: *** has not participated in Multimodal medical therapy including regular antiinflammatories: *** Ibuprofen, Tylenol , Flexeril  Injections: no epidural steroid injections  Past Surgery: ***none  Kathleen [name] has ***no symptoms of cervical myelopathy.  The symptoms are causing a significant impact on the patient's life.   I have utilized the care everywhere function in epic to review the outside records available from external health systems.  Review of Systems:  A 10 point review of systems is negative, except for the pertinent positives and negatives detailed in the HPI.  Past Medical History: Past Medical History:  Diagnosis Date   Allergy    Anemia    Hypertension     Past Surgical History: Past Surgical History:  Procedure Laterality Date   STENT PLACEMENT VASCULAR ([facility] HX)     TUBAL LIGATION      Allergies: Allergies as of 03/25/2024   (No Known Allergies)    Medications:  Current Outpatient Medications:    acetaminophen  (TYLENOL ) 325 MG tablet, Take 2 tablets (650 mg total) by mouth every 6 (six) hours as needed for mild pain (pain score 1-3) or moderate pain (pain score 4-6)., Disp: , Rfl:    apixaban  (ELIQUIS ) 5 MG TABS tablet, Take 1 tablet (5 mg total) by mouth 2 (two) times daily., Disp: 180 tablet, Rfl: 1   cyclobenzaprine  (FLEXERIL ) 5 MG tablet, Take 1 tablet (5 mg total) by mouth 3 (three) times  daily as needed for muscle spasms., Disp: 20 tablet, Rfl: 0   EPINEPHrine  0.3 mg/0.3 mL IJ SOAJ injection, Inject 0.3 mg into the muscle as needed for anaphylaxis., Disp: 1 each, Rfl: 0   folic acid  (FOLVITE ) 1 MG tablet, Take 1 tablet (1 mg total) by mouth daily., Disp: 90 tablet, Rfl: 1  Social History: Social History   Tobacco Use   Smoking status: Never   Smokeless tobacco: Never  Vaping Use   Vaping status: Never Used  Substance Use Topics   Alcohol use: Yes    Alcohol/week: 3.0 standard drinks of alcohol    Types: 1 Glasses of wine, 2 Shots of liquor per week   Drug use: Yes    Types: Marijuana    Family Medical History: Family History  Problem Relation Age of Onset   Congestive Heart Failure Mother    Pulmonary Hypertension Mother    COPD Mother    Sickle cell anemia Mother    Hypertension Mother    Irritable bowel syndrome Sister    Alcohol abuse Maternal Grandmother     Physical Examination: There were no vitals filed for this visit.  General: Patient is in no apparent distress. Attention to examination is appropriate.  Neck:   Supple.  Full range of motion.  Respiratory: Patient is breathing without any difficulty.   NEUROLOGICAL:     Awake, alert, oriented to person, place, and time.  Speech is clear and fluent.   Cranial Nerves: Pupils equal round and reactive to light.  Facial  tone is symmetric.  Facial sensation is symmetric. Shoulder shrug is symmetric. Tongue protrusion is midline.    Strength: Side Biceps Triceps Deltoid Interossei Grip Wrist Ext. Wrist Flex.  R 5 5 5 5 5 5 5   L 5 5 5 5 5 5 5    Side Iliopsoas Quads Hamstring PF DF EHL  R 5 5 5 5 5 5   L 5 5 5 5 5 5    Reflexes are ***2+ and symmetric at the biceps, triceps, brachioradialis, patella and achilles.   Hoffman's is absent. Clonus is absent  Bilateral upper and lower extremity sensation is intact to light touch ***.     No evidence of dysmetria noted.  Gait is normal.     Imaging: *** I have personally reviewed the images and agree with the above interpretation.  Medical Decision Making/Assessment and Plan: Ms. Janowicz is a pleasant 43 y.o. female with ***  There are no diagnoses linked to this encounter.   Thank you for involving me in the care of this patient.    Penne MICAEL Sharps MD/MSCR Neurosurgery

## 2024-03-18 ENCOUNTER — Other Ambulatory Visit: Payer: Self-pay

## 2024-03-18 DIAGNOSIS — D508 Other iron deficiency anemias: Secondary | ICD-10-CM

## 2024-03-19 NOTE — Patient Instructions (Incomplete)
 Blood Clot in the Lung (Pulmonary Embolism): What to Know  A pulmonary embolism (PE) is when you get a sudden blockage or less blood flow in one or both of your lungs. This typically happens when a blood clot moves into the arteries of your lung. A blood clot is blood that has turned into a gel or solid. Most blockages come from deep vein thrombosis (DVT). This is when a blood clot forms in the vein of a leg or arm and moves to your lungs. PE is dangerous. It must be treated right away. What are the causes? In most cases, PE is caused by a blood clot that forms in a vein and moves to your lungs.  In rare cases, it may be caused by: Air. Fat. Part of a tumor. A tumor is a growth of cells or tissue that isn't normal. Other tissue that moves through your veins and into your lungs. What increases the risk? You may be more likely to get PE if: You get really hurt, such as from: Breaking a hip or leg. You're also more at risk if your leg is in a cast or splint. An injury to your spinal cord. You have a big surgery, such as: A hip or knee replacement. Surgery on parts of your nervous system. Surgery on your belly. You have certain conditions, such as: A stroke. A problem with how your blood clots. A long-term lung or heart disease. You have a soft tube called a central venous catheter. You're being treated for cancer. You take medicines that have estrogen in them. You smoke. You spend a lot of time sitting, such as if you travel over 6 hours. You may also be more likely to get PE if you're: Pregnant or just had a baby. Older than 70 years of age. Overweight. Not very active. Not able to move at all. What are the signs or symptoms? Symptoms may start all of a sudden. They may include: Feeling short of breath while moving or resting. Coughing, coughing up blood, or coughing up bloody mucus. Pain that gets worse with deep breaths in your: Chest. Back. Shoulder blade. A heartbeat  that's fast or not normal. Feeling light-headed or dizzy. Fainting. Feeling worried or nervous. Pain and swelling in a leg. This is a symptom of DVT. DVT can lead to PE. How is this diagnosed? PE may be diagnosed based on your medical history, an exam, and tests. Tests may include: Blood tests. An electrocardiogram (ECG). This checks how well your heart is working. A CT pulmonary angiogram. This checks blood flow in and around your lungs. A ventilation-perfusion scan. This measures air flow and blood flow to your lungs. An ultrasound to check for a DVT. How is this treated? Treatment may depend on what caused the PE, how likely you are to have bleeding, and other conditions you have. Treatment may be done to: Stop blood clots from forming. Stop the clot from getting bigger. Break apart the clot. Remove the clot. Treatment may include: Medicines to: Stop clots from forming and growing. You may be given blood thinners. Break apart clots. Procedures, such as: An embolectomy. This uses a tube to remove a clot. Catheter-directed thrombolysis. This uses medicine to get rid of a clot. Surgical embolectomy. This is a surgery to get rid of the clot. It's used in rare cases. You may need more than one treatment. Work with your provider to choose the best treatment program for you. Follow these instructions at home: Medicines  Take your medicines only as told. If you're taking blood thinners: Take your medicines as told. Take them at the same time each day. Talk with your provider before taking any products you can buy at the store. Do not do things that could hurt or bruise you. Be careful to avoid falls. Wear an alert bracelet or carry a card that says you take blood thinners. Know what foods and other medicines you can have with your medicines. General instructions Stay at a healthy weight. Ask your provider what weight is healthy for you. Do not smoke, vape, or use nicotine or  tobacco. Do not sit or lie down for a long time without moving. Tell your provider if you plan to travel. Make sure you can still take your medicine while you travel. Ask what things are safe for you to do at home. Ask when you can go back to work or school. Keep all follow-up visits. Your provider will closely watch how you're doing. Where to find more information To learn more, go to these websites: Centers for Disease Control and Prevention at [employer].no. Then: Click Health Topics A-Z. Type "blood clot" into the search box. American Lung Association (ALA): lung.org Contact a health care provider if: You miss a dose of your blood thinner. You have a fever. Get help right away if: You have: New or more pain, swelling, warmth, or redness in an arm or leg. Worse chest pain. A heartbeat that's fast or not normal. A very bad headache. Changes to your eyesight. A very bad fall or accident, or you hit your head. Blood in your pee or poop. A cut that won't stop bleeding. You feel short of breath, and it gets worse when you're moving or resting. You throw up or cough up blood. You feel light-headed or dizzy, and the feeling doesn't go away. You can't move your arms or legs. You're confused or have memory loss. These symptoms may be an emergency. Call 911 right away. Do not wait to see if the symptoms will go away. Do not drive yourself to the hospital. This information is not intended to replace advice given to you by your health care provider. Make sure you discuss any questions you have with your health care provider. Document Revised: 10/14/2022 Document Reviewed: 10/14/2022 Elsevier Patient Education  2024 [employer].

## 2024-03-21 ENCOUNTER — Inpatient Hospital Stay: Admitting: Oncology

## 2024-03-21 ENCOUNTER — Inpatient Hospital Stay: Attending: Oncology

## 2024-03-21 ENCOUNTER — Ambulatory Visit: Payer: Self-pay | Admitting: Oncology

## 2024-03-21 ENCOUNTER — Other Ambulatory Visit: Payer: Self-pay

## 2024-03-21 ENCOUNTER — Encounter: Payer: Self-pay | Admitting: Oncology

## 2024-03-21 ENCOUNTER — Inpatient Hospital Stay

## 2024-03-21 VITALS — BP 160/124 | HR 88 | Temp 96.5°F | Resp 18 | Ht 66.0 in | Wt 217.1 lb

## 2024-03-21 DIAGNOSIS — D509 Iron deficiency anemia, unspecified: Secondary | ICD-10-CM | POA: Insufficient documentation

## 2024-03-21 DIAGNOSIS — Z7901 Long term (current) use of anticoagulants: Secondary | ICD-10-CM | POA: Diagnosis not present

## 2024-03-21 DIAGNOSIS — Z86711 Personal history of pulmonary embolism: Secondary | ICD-10-CM

## 2024-03-21 DIAGNOSIS — I2699 Other pulmonary embolism without acute cor pulmonale: Secondary | ICD-10-CM | POA: Diagnosis not present

## 2024-03-21 DIAGNOSIS — D508 Other iron deficiency anemias: Secondary | ICD-10-CM

## 2024-03-21 DIAGNOSIS — M25512 Pain in left shoulder: Secondary | ICD-10-CM | POA: Diagnosis not present

## 2024-03-21 LAB — CBC WITH DIFFERENTIAL (CANCER CENTER ONLY)
Abs Immature Granulocytes: 0.03 K/uL (ref 0.00–0.07)
Basophils Absolute: 0 K/uL (ref 0.0–0.1)
Basophils Relative: 1 %
Eosinophils Absolute: 0.1 K/uL (ref 0.0–0.5)
Eosinophils Relative: 2 %
HCT: 31.9 % — ABNORMAL LOW (ref 36.0–46.0)
Hemoglobin: 10.5 g/dL — ABNORMAL LOW (ref 12.0–15.0)
Immature Granulocytes: 0 %
Lymphocytes Relative: 31 %
Lymphs Abs: 2.2 K/uL (ref 0.7–4.0)
MCH: 26.4 pg (ref 26.0–34.0)
MCHC: 32.9 g/dL (ref 30.0–36.0)
MCV: 80.2 fL (ref 80.0–100.0)
Monocytes Absolute: 0.5 K/uL (ref 0.1–1.0)
Monocytes Relative: 7 %
Neutro Abs: 4.4 K/uL (ref 1.7–7.7)
Neutrophils Relative %: 59 %
Platelet Count: 470 K/uL — ABNORMAL HIGH (ref 150–400)
RBC: 3.98 MIL/uL (ref 3.87–5.11)
RDW: 15.6 % — ABNORMAL HIGH (ref 11.5–15.5)
WBC Count: 7.3 K/uL (ref 4.0–10.5)
nRBC: 0 % (ref 0.0–0.2)

## 2024-03-21 LAB — ANTITHROMBIN III: AntiThromb III Func: 98 % (ref 75–120)

## 2024-03-21 LAB — IRON AND TIBC
Iron: 43 ug/dL (ref 28–170)
Saturation Ratios: 10 % — ABNORMAL LOW (ref 10.4–31.8)
TIBC: 435 ug/dL (ref 250–450)
UIBC: 392 ug/dL

## 2024-03-21 LAB — FERRITIN: Ferritin: 18 ng/mL (ref 11–307)

## 2024-03-21 NOTE — Progress Notes (Signed)
 Pain 5/10 left upper right sided pain; completed flexeril  and tramadol  prescribed in ED.  Requesting a refill on either or.  Was referred to orthopaedic for possible arthritis / pinched nerve.

## 2024-03-21 NOTE — Progress Notes (Signed)
 Hematology/Oncology Consult note [facility]  [phone] [phone]  Patient Care Team: Cannady, Jolene T, NP as PCP - General (Nurse Practitioner) Darliss Rogue, MD as PCP - Cardiology (Cardiology) Melanee Annah BROCKS, MD as Consulting Physician (Oncology)   Name of the patient: Kathleen [name]  [id number]  [birth year]   Date of visit: 03/21/2024  Diagnosis- 1.  History of iron  deficiency anemia 2.  New diagnosis of pulmonary embolism  Chief complaint/ Reason for visit-posthospital discharge follow-up  Heme/Onc history: patient is a 43 year old African-American female referred for iron  deficiency anemiaLabs from 12/30/2021 showed white count of 6.8, H&H of 10.7/33.7 with an MCV of 81 and a platelet count of 286.  Ferritin levels were low at 10 with an iron  saturation of 8%.  TSH B12 was normal.  She also had a sickle cell screen done which was positive she denies any family history of any blood disorders.  Menstrual cycles at times can be heavy and that has been attributed as a cause of her iron  deficiency anemia  Patient presented To the ER in November 2025 with symptoms of left shoulder pain.  She underwent CT angio chest which incidentally showed mild amount of pulmonary embolism with segmental and subsegmental posterior lower branch of the right pulmonary artery.  She was started on Eliquis .  MRI of the left shoulder showed tendinosis of the supraspinatus and infraspinatus tendons for which she is seeing orthopedics  Interval history-she presently complains of ongoing left shoulder pain.  She is compliant with Eliquis   ECOG PS- 0 Pain scale- 0   Review of systems- Review of Systems  Constitutional:  Negative for chills, fever, malaise/fatigue and weight loss.  HENT:  Negative for congestion, ear discharge and nosebleeds.   Eyes:  Negative for blurred vision.  Respiratory:  Negative for cough, hemoptysis, sputum production, shortness  of breath and wheezing.   Cardiovascular:  Negative for chest pain, palpitations, orthopnea and claudication.  Gastrointestinal:  Negative for abdominal pain, blood in stool, constipation, diarrhea, heartburn, melena, nausea and vomiting.  Genitourinary:  Negative for dysuria, flank pain, frequency, hematuria and urgency.  Musculoskeletal:  Positive for joint pain. Negative for back pain and myalgias.  Skin:  Negative for rash.  Neurological:  Negative for dizziness, tingling, focal weakness, seizures, weakness and headaches.  Endo/Heme/Allergies:  Does not bruise/bleed easily.  Psychiatric/Behavioral:  Negative for depression and suicidal ideas. The patient does not have insomnia.       Allergies[1]   Past Medical History:  Diagnosis Date   Allergy    Anemia    Hypertension      Past Surgical History:  Procedure Laterality Date   STENT PLACEMENT VASCULAR ([facility] HX)     TUBAL LIGATION      Social History   Socioeconomic History   Marital status: Single    Spouse name: Not on file   Number of children: Not on file   Years of education: Not on file   Highest education level: Some college, no degree  Occupational History   Not on file  Tobacco Use   Smoking status: Never   Smokeless tobacco: Never  Vaping Use   Vaping status: Never Used  Substance and Sexual Activity   Alcohol use: Yes    Alcohol/week: 3.0 standard drinks of alcohol    Types: 1 Glasses of wine, 2 Shots of liquor per week   Drug use: Yes    Types: Marijuana   Sexual activity: Yes  Birth control/protection: Other-see comments    Comment: Tubal ligation  Other Topics Concern   Not on file  Social History Narrative   Not on file   Social Drivers of Health   Tobacco Use: Low Risk (03/21/2024)   Patient History    Smoking Tobacco Use: Never    Smokeless Tobacco Use: Never    Passive Exposure: Not on file  Financial Resource Strain: Low Risk (10/20/2023)   Overall Financial Resource Strain  (CARDIA)    Difficulty of Paying Living Expenses: Not hard at all  Food Insecurity: No Food Insecurity (02/24/2024)   Epic    Worried About Programme Researcher, Broadcasting/film/video in the Last Year: Never true    Ran Out of Food in the Last Year: Never true  Transportation Needs: No Transportation Needs (02/24/2024)   Epic    Lack of Transportation (Medical): No    Lack of Transportation (Non-Medical): No  Physical Activity: Insufficiently Active (10/20/2023)   Exercise Vital Sign    Days of Exercise per Week: 3 days    Minutes of Exercise per Session: 30 min  Stress: No Stress Concern Present (10/20/2023)   [employer] of Occupational Health - Occupational Stress Questionnaire    Feeling of Stress: Only a little  Social Connections: Socially Isolated (10/20/2023)   Social Connection and Isolation Panel    Frequency of Communication with Friends and Family: Once a week    Frequency of Social Gatherings with Friends and Family: Once a week    Attends Religious Services: More than 4 times per year    Active Member of [employer] or Organizations: No    Attends Banker Meetings: Not on file    Marital Status: Never married  Intimate Partner Violence: Not At Risk (02/24/2024)   Epic    Fear of Current or Ex-Partner: No    Emotionally Abused: No    Physically Abused: No    Sexually Abused: No  Depression (PHQ2-9): Low Risk (02/29/2024)   Depression (PHQ2-9)    PHQ-2 Score: 0  Alcohol Screen: Low Risk (10/20/2023)   Alcohol Screen    Last Alcohol Screening Score (AUDIT): 1  Housing: Low Risk (02/24/2024)   Epic    Unable to Pay for Housing in the Last Year: No    Number of Times Moved in the Last Year: 0    Homeless in the Last Year: No  Utilities: Not At Risk (02/24/2024)   Epic    Threatened with loss of utilities: No  Health Literacy: Adequate Health Literacy (10/20/2023)   B1300 Health Literacy    Frequency of need for help with medical instructions: Never    Family History   Problem Relation Age of Onset   Congestive Heart Failure Mother    Pulmonary Hypertension Mother    COPD Mother    Sickle cell anemia Mother    Hypertension Mother    Irritable bowel syndrome Sister    Alcohol abuse Maternal Grandmother     Current Medications[2]  Physical exam:  Vitals:   03/21/24 1315 03/21/24 1318  BP: (!) 149/123 (!) 160/124  Pulse: 89 88  Resp: 18   Temp: (!) 96.5 F (35.8 C)   TempSrc: Tympanic   SpO2: 98%   Weight: 217 lb 1.6 oz (98.5 kg)   Height: 5' 6 (1.676 m)    Physical Exam Cardiovascular:     Rate and Rhythm: Normal rate and regular rhythm.     Heart sounds: Normal heart sounds.  Pulmonary:  Effort: Pulmonary effort is normal.     Breath sounds: Normal breath sounds.  Skin:    General: Skin is warm and dry.  Neurological:     Mental Status: She is alert and oriented to person, place, and time.      I have personally reviewed labs listed below:    Latest Ref Rng & Units 02/29/2024    2:58 PM  CMP  Glucose 70 - 99 mg/dL 92   BUN 6 - 24 mg/dL 12   Creatinine 9.42 - 1.00 mg/dL 9.09   Sodium 865 - 855 mmol/L 138   Potassium 3.5 - 5.2 mmol/L 4.0   Chloride 96 - 106 mmol/L 102   CO2 20 - 29 mmol/L 24   Calcium 8.7 - 10.2 mg/dL 9.5   Total Protein 6.0 - 8.5 g/dL 7.3   Total Bilirubin 0.0 - 1.2 mg/dL 0.5   Alkaline Phos 41 - 116 IU/L 62   AST 0 - 40 IU/L 10   ALT 0 - 32 IU/L 7       Latest Ref Rng & Units 03/21/2024   12:57 PM  CBC  WBC 4.0 - 10.5 K/uL 7.3   Hemoglobin 12.0 - 15.0 g/dL 89.4   Hematocrit 63.9 - 46.0 % 31.9   Platelets 150 - 400 K/uL 470    I have personally reviewed Radiology images listed below: No images are attached to the encounter.  US  THYROID  Result Date: 03/08/2024 CLINICAL DATA:  Incidental on CT. EXAM: THYROID  ULTRASOUND TECHNIQUE: Ultrasound examination of the thyroid  gland and adjacent soft tissues was performed. COMPARISON:  None Available. FINDINGS: Parenchymal Echotexture: Mildly  heterogenous Isthmus: 0.4 cm Right lobe: 6.5 x 2.1 x 2.3 cm Left lobe: 5.9 x 1.6 x 1.7 cm _________________________________________________________ Estimated total number of nodules >/= 1 cm: 3 Number of spongiform nodules >/=  2 cm not described below (TR1): 0 Number of mixed cystic and solid nodules >/= 1.5 cm not described below (TR2): 0 _________________________________________________________ Nodule # 1: Ill-defined isoechoic solid nodule in the midpole measures approximately 2.0 x 1.6 x 1.9 cm. Findings are consistent with TI-RADS category 3. *Given size (>/= 1.5 - 2.4 cm) and appearance, a follow-up ultrasound in 1 year should be considered based on TI-RADS criteria. Nodule # 2: Isoechoic solid nodule in the left mid gland measures 1.4 x 1.3 x 1.4 cm. TI-RADS category 3. Given size (<1.4 cm) and appearance, this nodule does NOT meet TI-RADS criteria for biopsy or dedicated follow-up. Nodule # 3: Vague region of heterogeneity at the most inferior aspect of the left thyroid  gland favored to represent a pseudo nodule. IMPRESSION: 1. Diffuse goitrous change of the left mid and lower gland. 2. Nodule # 1 is a 2.0 cm TI-RADS category 3 nodule within the left mid gland which meets criteria for imaging surveillance. Recommend follow-up ultrasound in 1 year. The above is in keeping with the ACR TI-RADS recommendations - J Am Coll Radiol 2017;14:587-595. Electronically Signed   By: Wilkie Lent M.D.   On: 03/08/2024 15:54   MR SHOULDER LEFT WO CONTRAST Result Date: 02/26/2024 CLINICAL DATA:  Shoulder pain. EXAM: MRI OF THE LEFT SHOULDER WITHOUT CONTRAST TECHNIQUE: Multiplanar, multisequence MR imaging of the shoulder was performed. No intravenous contrast was administered. COMPARISON:  Left shoulder radiographs dated 02/25/2024. FINDINGS: Rotator cuff: Mild supraspinatus and infraspinatus tendinosis. Teres minor tendon is intact. Subscapularis tendon is intact. Muscles: No muscle atrophy or edema. Biceps Long  Head: Intact. Acromioclavicular Joint: Mild arthropathy of the acromioclavicular  joint. No subacromial/subdeltoid bursal fluid. Glenohumeral Joint: No joint effusion. No chondral defect. Labrum: Grossly intact, but evaluation is limited by lack of intraarticular fluid/contrast. Bones: No fracture or dislocation. No aggressive osseous lesion. Subcortical cystic changes of the greater tuberosity, likely secondary to chronic rotator cuff pathology. Other: No fluid collection. IMPRESSION: 1. Mild tendinosis of the supraspinatus and infraspinatus tendons. 2. Subcortical cystic changes of the greater tuberosity, likely secondary to chronic rotator cuff pathology. 3. Mild arthropathy of the acromioclavicular joint. Electronically Signed   By: Harrietta Sherry M.D.   On: 02/26/2024 12:04   MR CERVICAL SPINE WO CONTRAST Result Date: 02/26/2024 EXAM: MRI CERVICAL SPINE WITHOUT CONTRAST 02/25/2024 11:11:08 PM TECHNIQUE: Multiplanar multisequence MRI of the cervical spine was performed. COMPARISON: Cervical spine radiographs from earlier today. CLINICAL HISTORY: Cervical radiculopathy, no red flags; neck pain, left shoulder and arm pain. FINDINGS: BONES AND ALIGNMENT: Normal alignment. Normal vertebral body heights. Bone marrow signal is unremarkable. SPINAL CORD: Normal spinal cord size. No abnormal spinal cord signal. SOFT TISSUES: No paraspinal mass. C2-C3: No significant disc herniation. No spinal canal stenosis or neural foraminal narrowing. C3-C4: No significant disc herniation. No spinal canal stenosis. Left greater than right facet and uncovertebral hypertrophy. Resulting mild left foraminal stenosis. C4-C5: No significant disc herniation. No spinal canal stenosis. Right greater than left facet and uncovertebral hypertrophy. Resulting moderate right foraminal stenosis. C5-C6: Small posterior disc-osteophyte complex. No spinal canal stenosis. Right greater than left facet and uncovertebral hypertrophy. Resulting mild  right foraminal stenosis. C6-C7: Small posterior disc-osteophyte complex. No spinal canal stenosis. Left greater than right facet and uncovertebral hypertrophy. Resulting mild left foraminal stenosis. C7-T1: No significant disc herniation. No spinal canal stenosis or neural foraminal narrowing. IMPRESSION: 1. Moderate right foraminal stenosis at C4-C5. 2. Mild foraminal stenosis on the right at C3-C4 and C5-C6 and the left at C6-C7. 3. Small posterior disc-osteophyte complexes at C5-C6 and C6-C7 without significant canal stenosis. Electronically signed by: Gilmore Molt MD 02/26/2024 01:09 AM EST RP Workstation: [id number]   DG Shoulder Left Result Date: 02/25/2024 CLINICAL DATA:  Left shoulder pain EXAM: LEFT SHOULDER - 2+ VIEW COMPARISON:  None Available. FINDINGS: There is no evidence of fracture or dislocation. There is no evidence of arthropathy or other focal bone abnormality. Soft tissues are unremarkable. IMPRESSION: Negative. Electronically Signed   By: Greig Pique M.D.   On: 02/25/2024 15:22   DG Cervical Spine 2 or 3 views Result Date: 02/25/2024 CLINICAL DATA:  Neck pain. EXAM: CERVICAL SPINE - 2-3 VIEW COMPARISON:  None Available. FINDINGS: No acute fracture or subluxation of the cervical spine. There is spurring and osteophyte along the anterior C5-C6 and C6-C7. The visualized posterior elements and odontoid appear intact. There is anatomic alignment of the lateral masses of C1 and C2. The soft tissues are unremarkable. IMPRESSION: No acute findings. Electronically Signed   By: Vanetta Chou M.D.   On: 02/25/2024 15:22   CT Angio Chest Aorta W and/or Wo Contrast Result Date: 02/24/2024 CLINICAL DATA:  Chest pain. EXAM: CT ANGIOGRAPHY CHEST WITH CONTRAST TECHNIQUE: Multidetector CT imaging of the chest was performed using the standard protocol during bolus administration of intravenous contrast. Multiplanar CT image reconstructions and MIPs were obtained to evaluate the vascular  anatomy. RADIATION DOSE REDUCTION: This exam was performed according to the departmental dose-optimization program which includes automated exposure control, adjustment of the mA and/or kV according to patient size and/or use of iterative reconstruction technique. CONTRAST:  75mL OMNIPAQUE  IOHEXOL  350 MG/ML SOLN COMPARISON:  None Available. FINDINGS: Cardiovascular: Satisfactory opacification of the pulmonary arteries to the segmental level. A mild amount of intraluminal low attenuation is seen within a segmental and subsegmental posterior lower lobe branch of the right pulmonary artery (axial CT images 94 through 98, CT series 8). Normal heart size without evidence of right heart strain (RV/LV ratio of 0.94). No pericardial effusion. Mediastinum/Nodes: No enlarged mediastinal, hilar, or axillary lymph nodes. Multiple small heterogeneous thyroid  nodules are seen within the right and left lobes of the thyroid  gland. The trachea and esophagus demonstrate no significant findings. Lungs/Pleura: Lungs are clear. No pleural effusion or pneumothorax. Upper Abdomen: No acute abnormality. Musculoskeletal: No chest wall abnormality. No acute or significant osseous findings. Review of the MIP images confirms the above findings. IMPRESSION: 1. Mild amount of pulmonary embolism within a segmental and subsegmental posterior lower lobe branch of the right pulmonary artery. 2. Multiple small heterogeneous thyroid  nodules. Correlation with nonemergent thyroid  ultrasound is recommended. Electronically Signed   By: Suzen Dials M.D.   On: 02/24/2024 18:28   DG Chest 2 View Result Date: 02/24/2024 CLINICAL DATA:  chest pain EXAM: CHEST - 2 VIEW COMPARISON:  02/22/2022 FINDINGS: The heart size and mediastinal contours are within normal limits. Both lungs are clear. The visualized skeletal structures are unremarkable. IMPRESSION: No active cardiopulmonary disease. Electronically Signed   By: CHRISTELLA.  Shick M.D.   On: 02/24/2024 12:50      Assessment and plan- Patient is a 43 y.o. female with history of iron  deficiency anemia referred for new diagnosis of pulmonary embolism  Assessment and Plan    Pulmonary embolism Recent unprovoked pulmonary embolism with small clot burden. No provoking factors identified. Family history of maternal thrombosis and pulmonary hypertension. Low recurrence risk post-anticoagulation, pending thrombophilia evaluation. - Continued apixaban  for 6 months, discontinuation planned in May. - Ordered thrombophilia evaluation. - Arranged follow-up, including virtual option, to review results. - Instructed follow-up with vascular specialist in April or May. - Advised to address shoulder pain with primary care provider.     History of iron  deficiency anemia: Labs today are suggestive of ongoing iron  deficiency.  She is also anemic with a hemoglobin of 10.5.  Will plan to give her IV iron .  Discussed risks and benefits of IV iron  including all but not limited to possible risk of infusion anaphylactic reaction.  Patient understands and agrees to proceed as planned patient has received Venofer  in the past and will likely get the same based on insurance 200 mg x 5 doses    Visit Diagnosis 1. History of pulmonary embolism   2. Other iron  deficiency anemia      Dr. Annah Skene, MD, MPH [facility] [phone] 03/21/2024 4:42 PM                   [1] No Known Allergies [2]  Current Outpatient Medications:    acetaminophen  (TYLENOL ) 325 MG tablet, Take 2 tablets (650 mg total) by mouth every 6 (six) hours as needed for mild pain (pain score 1-3) or moderate pain (pain score 4-6)., Disp: , Rfl:    apixaban  (ELIQUIS ) 5 MG TABS tablet, Take 1 tablet (5 mg total) by mouth 2 (two) times daily., Disp: 180 tablet, Rfl: 1   EPINEPHrine  0.3 mg/0.3 mL IJ SOAJ injection, Inject 0.3 mg into the muscle as needed for anaphylaxis., Disp: 1 each, Rfl: 0   Ferrous Sulfate  Dried (SLOW RELEASE IRON ) 45 MG TBCR, Take by mouth., Disp: , Rfl:  folic acid  (FOLVITE ) 1 MG tablet, Take 1 tablet (1 mg total) by mouth daily., Disp: 90 tablet, Rfl: 1   cyclobenzaprine  (FLEXERIL ) 5 MG tablet, Take 1 tablet (5 mg total) by mouth 3 (three) times daily as needed for muscle spasms. (Patient not taking: Reported on 03/21/2024), Disp: 20 tablet, Rfl: 0

## 2024-03-22 ENCOUNTER — Ambulatory Visit: Admitting: Nurse Practitioner

## 2024-03-22 VITALS — BP 150/96 | HR 88 | Temp 97.8°F | Resp 15 | Ht 65.98 in | Wt 216.8 lb

## 2024-03-22 DIAGNOSIS — I10 Essential (primary) hypertension: Secondary | ICD-10-CM

## 2024-03-22 DIAGNOSIS — M12812 Other specific arthropathies, not elsewhere classified, left shoulder: Secondary | ICD-10-CM

## 2024-03-22 DIAGNOSIS — Z86711 Personal history of pulmonary embolism: Secondary | ICD-10-CM

## 2024-03-22 MED ORDER — CYCLOBENZAPRINE HCL 5 MG PO TABS: 5.0000 mg | ORAL_TABLET | Freq: Three times a day (TID) | ORAL | 1 refills | Status: AC | PRN

## 2024-03-22 MED ORDER — VALSARTAN 40 MG PO TABS: 40.0000 mg | ORAL_TABLET | Freq: Every day | ORAL | 3 refills | Status: DC

## 2024-03-22 NOTE — Telephone Encounter (Signed)
-----   Message from Kathleen Skene, MD sent at 03/21/2024  2:30 PM EST ----- She is anemic and needs IV iron .  Please ask her if she would be okay to come and get it

## 2024-03-22 NOTE — Telephone Encounter (Signed)
 Per Dr. Melanee She is anemic and needs IV iron . Please ask her if she would be okay to come and get it.  Has had venofer  in the past.  Patient agrees to IV iron .  Patient mentioned she would like to be scheduled on the days she has off; mentioned next week she's off Wednesday and Thursday.  Advised patient to keep an eye on her my chart for upcoming appointment.

## 2024-03-22 NOTE — Assessment & Plan Note (Signed)
 Acute, following with ortho. Appreciate their input. Referral to PT placed, has not heard from ortho referral for PT. Refills on Flexeril .

## 2024-03-22 NOTE — Assessment & Plan Note (Signed)
 Acute and unprovoked. Is taking Eliquis , continue this until sees vascular in 6 months. May need assistance with cost in future, consider PharmD referral. Continue collaboration with hematology and vascular. Will complete FMLA forms for patient.

## 2024-03-22 NOTE — Assessment & Plan Note (Signed)
 Chronic, stable.  BP above goal on 2 checks today, took medication in past. Start Valsartan  40 MG daily, educated her on this medication and side effects to monitor for. Recent K+ 4. Recommend she monitor BP at least a few mornings a week at home and document.  DASH diet at home. Labs today: up to date.

## 2024-03-22 NOTE — Progress Notes (Signed)
 BP (!) 150/96 (BP Location: Right Arm, Patient Position: Sitting, Cuff Size: Normal)   Pulse 88   Temp 97.8 F (36.6 C) (Oral)   Resp 15   Ht 5' 5.98 (1.676 m)   Wt 216 lb 12.8 oz (98.3 kg)   LMP 03/08/2024 (Approximate)   SpO2 95%   BMI 35.01 kg/m    Subjective:    Patient ID: Kathleen [name], female    DOB: [birth year], 43 y.o.   MRN: [id number]  HPI: Kathleen [name] is a 43 y.o. female  Chief Complaint  Patient presents with   PE follow up   HYPERTENSION without Chronic Kidney Disease PE diagnosed on 02/24/24. Saw hematology on 03/21/24, multiple labs performed. Is to start iron  infusions. Coagulation labs pending. Taking Eliquis .  Will follow-up with vascular in 2026 to stay on Eliquis  until that time. Needs new referral to PT for left rotator cuff arthropathy,, ortho placed referral but she has not heard from anyone.  Needs refills on Flexeril  for this too. Hypertension status: uncontrolled  Satisfied with current treatment? 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 Interim medical history since our last visit reviewed. Allergies and medications reviewed and updated.  Review of Systems  Constitutional:  Negative for activity change, appetite change, chills, fatigue and fever.  Respiratory:  Negative for cough, chest tightness, shortness of breath and wheezing.   Cardiovascular:  Negative for chest pain, palpitations and leg swelling.  Gastrointestinal: Negative.   Musculoskeletal:  Positive for arthralgias.  Neurological:  Negative.   Psychiatric/Behavioral: Negative.      Per HPI unless specifically indicated above     Objective:    BP (!) 150/96 (BP Location: Right Arm, Patient Position: Sitting, Cuff Size: Normal)   Pulse 88   Temp 97.8 F (36.6 C) (Oral)   Resp 15   Ht 5' 5.98 (1.676 m)   Wt 216 lb 12.8 oz (98.3 kg)   LMP 03/08/2024 (Approximate)   SpO2 95%   BMI 35.01 kg/m   Wt Readings from Last 3 Encounters:  03/22/24 216 lb 12.8 oz (98.3 kg)  03/21/24 217 lb 1.6 oz (98.5 kg)  02/29/24 218 lb (98.9 kg)    Physical Exam Vitals and nursing note reviewed.  Constitutional:      General: She is awake. She is not in acute distress.    Appearance: She is well-developed and well-groomed. She is obese. She is not ill-appearing or toxic-appearing.  HENT:     Head: Normocephalic.     Right Ear: Hearing and external ear normal.     Left Ear: Hearing and external ear normal.  Eyes:     General: Lids are normal.        Right eye: No discharge.        Left eye: No discharge.     Conjunctiva/sclera: Conjunctivae normal.     Pupils: Pupils are equal, round, and reactive to light.  Neck:     Thyroid : No thyromegaly.     Vascular: No carotid bruit.  Cardiovascular:     Rate and Rhythm: Normal rate and regular rhythm.  Heart sounds: Normal heart sounds. No murmur heard.    No gallop.  Pulmonary:     Effort: Pulmonary effort is normal. No accessory muscle usage or respiratory distress.     Breath sounds: Normal breath sounds.  Abdominal:     General: Bowel sounds are normal. There is no distension.     Palpations: Abdomen is soft.     Tenderness: There is no abdominal tenderness.  Musculoskeletal:     Right shoulder: Normal.     Left shoulder: Tenderness present. No swelling, bony tenderness or crepitus. Normal range of motion. Decreased strength.     Cervical back: Normal range of motion and neck supple.     Right lower leg: No edema.     Left lower leg: No edema.  Lymphadenopathy:      Cervical: No cervical adenopathy.  Skin:    General: Skin is warm and dry.  Neurological:     Mental Status: She is alert and oriented to person, place, and time.     Deep Tendon Reflexes: Reflexes are normal and symmetric.     Reflex Scores:      Brachioradialis reflexes are 2+ on the right side and 2+ on the left side.      Patellar reflexes are 2+ on the right side and 2+ on the left side. Psychiatric:        Attention and Perception: Attention normal.        Mood and Affect: Mood normal.        Speech: Speech normal.        Behavior: Behavior normal. Behavior is cooperative.        Thought Content: Thought content normal.    Results for orders placed or performed in visit on 03/21/24  Antithrombin III    Collection Time: 03/21/24  1:41 PM  Result Value Ref Range   AntiThromb III Func 98 75 - 120 %      Assessment & Plan:   Problem List Items Addressed This Visit       Cardiovascular and Mediastinum   Essential hypertension   Chronic, stable.  BP above goal on 2 checks today, took medication in past. Start Valsartan  40 MG daily, educated her on this medication and side effects to monitor for. Recent K+ 4. Recommend she monitor BP at least a few mornings a week at home and document.  DASH diet at home. Labs today: up to date.       Relevant Medications   valsartan  (DIOVAN ) 40 MG tablet     Musculoskeletal and Integument   Rotator cuff arthropathy, left - Primary   Acute, following with ortho. Appreciate their input. Referral to PT placed, has not heard from ortho referral for PT. Refills on Flexeril .      Relevant Orders   Ambulatory referral to Physical Therapy     Other   History of pulmonary embolus (PE)   Acute and unprovoked. Is taking Eliquis , continue this until sees vascular in 6 months. May need assistance with cost in future, consider PharmD referral. Continue collaboration with hematology and vascular. Will complete FMLA forms for patient.         Follow up plan: Return in about 4 weeks (around 04/19/2024) for HTN -- start Valsartan  40 MG on 03/22/24.

## 2024-03-23 ENCOUNTER — Ambulatory Visit: Admitting: Physician Assistant

## 2024-03-23 ENCOUNTER — Telehealth: Payer: Self-pay | Admitting: Oncology

## 2024-03-23 ENCOUNTER — Encounter: Payer: Self-pay | Admitting: Oncology

## 2024-03-23 LAB — LUPUS ANTICOAGULANT
DRVVT: 37.5 s (ref 0.0–47.0)
PTT Lupus Anticoagulant: 38 s (ref 0.0–43.5)
Thrombin Time: 20.2 s (ref 0.0–23.0)
dPT Confirm Ratio: 0.85 ratio (ref 0.00–1.34)
dPT: 37.3 s (ref 0.0–47.6)

## 2024-03-23 LAB — PROTEIN S PANEL
Protein S Activity: 100 % (ref 63–140)
Protein S Ag, Free: 113 % (ref 61–136)
Protein S Ag, Total: 96 % (ref 60–150)

## 2024-03-23 LAB — CARDIOLIPIN ANTIBODIES, IGG, IGM, IGA
Anticardiolipin IgA: <9 U/mL (ref 0–11)
Anticardiolipin IgG: 10 GPL U/mL (ref 0–14)
Anticardiolipin IgM: <9 [MPL'U]/mL (ref 0–12)

## 2024-03-23 LAB — HEXAGONAL PHASE PHOSPHOLIPID: Hex Phosph Neut Test: 2 s (ref 0–11)

## 2024-03-23 LAB — PROTEIN C, TOTAL: Protein C, Total: 96 % (ref 60–150)

## 2024-03-23 LAB — HEX PHASE PHOSPHOLIPID REFLEX

## 2024-03-23 NOTE — Telephone Encounter (Signed)
 Pt called back to schedule her IV iron . I went over the dates/times we still had in December and none of those appts worked for the pt. We were able to schedule IV IRON  for 04/12/23. And pt then has a virtual visit with MD on 1/8

## 2024-03-23 NOTE — Progress Notes (Signed)
Patient has been scheduled for infusion

## 2024-03-24 LAB — FACTOR 5 LEIDEN

## 2024-03-24 LAB — BETA-2-GLYCOPROTEIN I ABS, IGG/M/A
Beta-2 Glyco I IgG: <9 GPI IgG units (ref 0–20)
Beta-2-Glycoprotein I IgA: <9 GPI IgA units (ref 0–25)
Beta-2-Glycoprotein I IgM: <9 GPI IgM units (ref 0–32)

## 2024-03-25 ENCOUNTER — Encounter: Payer: Self-pay | Admitting: Neurosurgery

## 2024-03-25 ENCOUNTER — Ambulatory Visit

## 2024-03-25 ENCOUNTER — Ambulatory Visit: Admitting: Neurosurgery

## 2024-03-25 VITALS — BP 146/102 | Ht 66.0 in | Wt 210.0 lb

## 2024-03-25 DIAGNOSIS — M4802 Spinal stenosis, cervical region: Secondary | ICD-10-CM

## 2024-03-25 DIAGNOSIS — M47812 Spondylosis without myelopathy or radiculopathy, cervical region: Secondary | ICD-10-CM | POA: Insufficient documentation

## 2024-03-25 DIAGNOSIS — M5020 Other cervical disc displacement, unspecified cervical region: Secondary | ICD-10-CM | POA: Diagnosis not present

## 2024-03-25 DIAGNOSIS — M25519 Pain in unspecified shoulder: Secondary | ICD-10-CM | POA: Diagnosis not present

## 2024-03-25 DIAGNOSIS — M542 Cervicalgia: Secondary | ICD-10-CM | POA: Diagnosis not present

## 2024-03-28 ENCOUNTER — Ambulatory Visit: Admitting: Nurse Practitioner

## 2024-03-28 ENCOUNTER — Telehealth: Payer: Self-pay | Admitting: Nurse Practitioner

## 2024-03-28 LAB — PROTHROMBIN GENE MUTATION

## 2024-03-28 NOTE — Telephone Encounter (Signed)
 Patient aware paperwork ready for pickup. Our open office hours are Monday thru Friday 8:000 am to 12:00 pm and again 12:45 pm to 5:00 pm.

## 2024-03-28 NOTE — Telephone Encounter (Signed)
 Copied from CRM #[id number]. Topic: General - Other >> Mar 28, 2024 10:47 AM Kathleen [name] wrote: Pt calling to follow up on FMLA paperwork which she sdays she dropped off on 12/16. Please contact her when it is ready.

## 2024-04-08 ENCOUNTER — Ambulatory Visit: Admitting: Licensed Practical Nurse

## 2024-04-11 ENCOUNTER — Encounter: Payer: Self-pay | Admitting: Oncology

## 2024-04-11 ENCOUNTER — Inpatient Hospital Stay: Attending: Oncology

## 2024-04-11 DIAGNOSIS — D509 Iron deficiency anemia, unspecified: Secondary | ICD-10-CM | POA: Insufficient documentation

## 2024-04-11 NOTE — Progress Notes (Signed)
 B/P elevated, pt asymptomatic, pt states she has not taken her B/P medications today. Per Dr. Melanee hold Venofer  and follow up with PCP today. Pt aware and verbalizes understanding. Pt educated to seek emergency care if symptoms arise, to take B/P medications as soon as she gets home, contact PCP today, and monitor B/P at home. Pt verbalizes understanding.  Pt stable at time of discharge.

## 2024-04-12 ENCOUNTER — Ambulatory Visit: Payer: Self-pay | Admitting: Neurosurgery

## 2024-04-14 ENCOUNTER — Encounter: Payer: Self-pay | Admitting: Oncology

## 2024-04-14 ENCOUNTER — Inpatient Hospital Stay: Admitting: Oncology

## 2024-04-14 ENCOUNTER — Other Ambulatory Visit: Payer: Self-pay

## 2024-04-14 DIAGNOSIS — D509 Iron deficiency anemia, unspecified: Secondary | ICD-10-CM | POA: Diagnosis not present

## 2024-04-14 DIAGNOSIS — D508 Other iron deficiency anemias: Secondary | ICD-10-CM

## 2024-04-14 DIAGNOSIS — I2699 Other pulmonary embolism without acute cor pulmonale: Secondary | ICD-10-CM

## 2024-04-14 DIAGNOSIS — Z7901 Long term (current) use of anticoagulants: Secondary | ICD-10-CM

## 2024-04-14 NOTE — Progress Notes (Unsigned)
 Attempted to check in patient prior to video visit appointment, voice message left.

## 2024-04-14 NOTE — Progress Notes (Unsigned)
 No appetite since hospitalized last month.  Nauseous when she doesn't eat.  When she does have a bowel movement lower left back pain / discomfort.  Denies pain at this time.

## 2024-04-16 ENCOUNTER — Encounter: Payer: Self-pay | Admitting: Oncology

## 2024-04-16 NOTE — Patient Instructions (Signed)
 Be Involved in Caring For Your Health:  Taking Medications When medications are taken as directed, they can greatly improve your health. But if they are not taken as prescribed, they may not work. In some cases, not taking them correctly can be harmful. To help ensure your treatment remains effective and safe, understand your medications and how to take them. Bring your medications to each visit for review by your provider.  Your lab results, notes, and after visit summary will be available on My Chart. We strongly encourage you to use this feature. If lab results are abnormal the clinic will contact you with the appropriate steps. If the clinic does not contact you assume the results are satisfactory. You can always view your results on My Chart. If you have questions regarding your health or results, please contact the clinic during office hours. You can also ask questions on My Chart.  We at [facility] are grateful that you chose us  to provide your care. We strive to provide evidence-based and compassionate care and are always looking for feedback. If you get a survey from the clinic please complete this so we can hear your opinions.  DASH Eating Plan DASH stands for Dietary Approaches to Stop Hypertension. The DASH eating plan is a healthy eating plan that has been shown to: Lower high blood pressure (hypertension). Reduce your risk for type 2 diabetes, heart disease, and stroke. Help with weight loss. What are tips for following this plan? Reading food labels Check food labels for the amount of salt (sodium) per serving. Choose foods with less than 5 percent of the Daily Value (DV) of sodium. In general, foods with less than 300 milligrams (mg) of sodium per serving fit into this eating plan. To find whole grains, look for the word whole as the first word in the ingredient list. Shopping Buy products labeled as low-sodium or no salt added. Buy fresh foods. Avoid canned  foods and pre-made or frozen meals. Cooking Try not to add salt when you cook. Use salt-free seasonings or herbs instead of table salt or sea salt. Check with your health care provider or pharmacist before using salt substitutes. Do not fry foods. Cook foods in healthy ways, such as baking, boiling, grilling, roasting, or broiling. Cook using oils that are good for your heart. These include olive, canola, avocado, soybean, and sunflower oil. Meal planning  Eat a balanced diet. This should include: 4 or more servings of fruits and 4 or more servings of vegetables each day. Try to fill half of your plate with fruits and vegetables. 6-8 servings of whole grains each day. 6 or less servings of lean meat, poultry, or fish each day. 1 oz is 1 serving. A 3 oz (85 g) serving of meat is about the same size as the palm of your hand. One egg is 1 oz (28 g). 2-3 servings of low-fat dairy each day. One serving is 1 cup (237 mL). 1 serving of nuts, seeds, or beans 5 times each week. 2-3 servings of heart-healthy fats. Healthy fats called omega-3 fatty acids are found in foods such as walnuts, flaxseeds, fortified milks, and eggs. These fats are also found in cold-water fish, such as sardines, salmon, and mackerel. Limit how much you eat of: Canned or prepackaged foods. Food that is high in trans fat, such as fried foods. Food that is high in saturated fat, such as fatty meat. Desserts and other sweets, sugary drinks, and other foods with added sugar. Full-fat  dairy products. Do not salt foods before eating. Do not eat more than 4 egg yolks a week. Try to eat at least 2 vegetarian meals a week. Eat more home-cooked food and less restaurant, buffet, and fast food. Lifestyle When eating at a restaurant, ask if your food can be made with less salt or no salt. If you drink alcohol: Limit how much you have to: 0-1 drink a day if you are female. 0-2 drinks a day if you are female. Know how much alcohol is in  your drink. In the [place] one drink is one 12 oz bottle of beer (355 mL), one 5 oz glass of wine (148 mL), or one 1 oz glass of hard liquor (44 mL). General information Avoid eating more than 2,300 mg of salt a day. If you have hypertension, you may need to reduce your sodium intake to 1,500 mg a day. Work with your provider to stay at a healthy body weight or lose weight. Ask what the best weight range is for you. On most days of the week, get at least 30 minutes of exercise that causes your heart to beat faster. This may include walking, swimming, or biking. Work with your provider or dietitian to adjust your eating plan to meet your specific calorie needs. What foods should I eat? Fruits All fresh, dried, or frozen fruit. Canned fruits that are in their natural juice and do not have sugar added to them. Vegetables Fresh or frozen vegetables that are raw, steamed, roasted, or grilled. Low-sodium or reduced-sodium tomato and vegetable juice. Low-sodium or reduced-sodium tomato sauce and tomato paste. Low-sodium or reduced-sodium canned vegetables. Grains Whole-grain or whole-wheat bread. Whole-grain or whole-wheat pasta. Brown rice. [place]neil Madeira. Bulgur. Whole-grain and low-sodium cereals. Pita bread. Low-fat, low-sodium crackers. Whole-wheat flour tortillas. Meats and other proteins Skinless chicken or [place]. Ground chicken or [place]. Pork with fat trimmed off. Fish and seafood. Egg whites. Dried beans, peas, or lentils. Unsalted nuts, nut butters, and seeds. Unsalted canned beans. Lean cuts of beef with fat trimmed off. Low-sodium, lean precooked or cured meat, such as sausages or meat loaves. Dairy Low-fat (1%) or fat-free (skim) milk. Reduced-fat, low-fat, or fat-free cheeses. Nonfat, low-sodium ricotta or cottage cheese. Low-fat or nonfat yogurt. Low-fat, low-sodium cheese. Fats and oils Soft margarine without trans fats. Vegetable oil. Reduced-fat, low-fat, or light mayonnaise and salad  dressings (reduced-sodium). Canola, safflower, olive, avocado, soybean, and sunflower oils. Avocado. Seasonings and condiments Herbs. Spices. Seasoning mixes without salt. Other foods Unsalted popcorn and pretzels. Fat-free sweets. The items listed above may not be all the foods and drinks you can have. Talk to a dietitian to learn more. What foods should I avoid? Fruits Canned fruit in a light or heavy syrup. Fried fruit. Fruit in cream or butter sauce. Vegetables Creamed or fried vegetables. Vegetables in a cheese sauce. Regular canned vegetables that are not marked as low-sodium or reduced-sodium. Regular canned tomato sauce and paste that are not marked as low-sodium or reduced-sodium. Regular tomato and vegetable juices that are not marked as low-sodium or reduced-sodium. Dene. Olives. Grains Baked goods made with fat, such as croissants, muffins, or some breads. Dry pasta or rice meal packs. Meats and other proteins Fatty cuts of meat. Ribs. Fried meat. [place]. Bologna, salami, and other precooked or cured meats, such as sausages or meat loaves, that are not lean and low in sodium. Fat from the back of a pig (fatback). Bratwurst. Salted nuts and seeds. Canned beans with added salt. Canned  or smoked fish. Whole eggs or egg yolks. Chicken or [place] with skin. Dairy Whole or 2% milk, cream, and half-and-half. Whole or full-fat cream cheese. Whole-fat or sweetened yogurt. Full-fat cheese. Nondairy creamers. Whipped toppings. Processed cheese and cheese spreads. Fats and oils Butter. Stick margarine. Lard. Shortening. Ghee. Bacon fat. Tropical oils, such as coconut, palm kernel, or palm oil. Seasonings and condiments Onion salt, garlic salt, seasoned salt, table salt, and sea salt. Worcestershire sauce. Tartar sauce. Barbecue sauce. Teriyaki sauce. Soy sauce, including reduced-sodium soy sauce. Steak sauce. Canned and packaged gravies. Fish sauce. Oyster sauce. Cocktail sauce. Store-bought  horseradish. Ketchup. Mustard. Meat flavorings and tenderizers. Bouillon cubes. Hot sauces. Pre-made or packaged marinades. Pre-made or packaged taco seasonings. Relishes. Regular salad dressings. Other foods Salted popcorn and pretzels. The items listed above may not be all the foods and drinks you should avoid. Talk to a dietitian to learn more. Where to find more information National Heart, Lung, and Blood Institute (NHLBI): BuffaloDryCleaner.gl American Heart Association (AHA): heart.org Academy of Nutrition and Dietetics: eatright.org National Kidney Foundation (NKF): kidney.org This information is not intended to replace advice given to you by your health care provider. Make sure you discuss any questions you have with your health care provider. Document Revised: 04/10/2022 Document Reviewed: 04/10/2022 Elsevier Patient Education  2024 [employer].

## 2024-04-16 NOTE — Progress Notes (Signed)
 I connected with Kathleen [name] on 04/16/2024 at 10:15 AM EST by video enabled telemedicine visit and verified that I am speaking with the correct person using two identifiers.   I discussed the limitations, risks, security and privacy concerns of performing an evaluation and management service by telemedicine and the availability of in-person appointments. I also discussed with the patient that there may be a patient responsible charge related to this service. The patient expressed understanding and agreed to proceed.  Other persons participating in the visit and their role in the encounter:  none  Patient's location:  home Provider's location:  home  Chief Complaint: Discuss results of hypercoagulable blood work  History of present illness: patient is a 44 year old African-American female referred for iron  deficiency anemiaLabs from 12/30/2021 showed white count of 6.8, H&H of 10.7/33.7 with an MCV of 81 and a platelet count of 286.  Ferritin levels were low at 10 with an iron  saturation of 8%.  TSH B12 was normal.  She also had a sickle cell screen done which was positive she denies any family history of any blood disorders.  Menstrual cycles at times can be heavy and that has been attributed as a cause of her iron  deficiency anemia   Patient presented To the ER in November 2025 with symptoms of left shoulder pain.  She underwent CT angio chest which incidentally showed mild amount of pulmonary embolism with segmental and subsegmental posterior lower branch of the right pulmonary artery.  She was started on Eliquis .  MRI of the left shoulder showed tendinosis of the supraspinatus and infraspinatus tendons for which she is seeing orthopedics    Interval history no acute issues since last visit.  She is presently on Eliquis  and tolerating it well.   Review of Systems  Constitutional:  Negative for chills, fever, malaise/fatigue and weight loss.  HENT:  Negative for congestion, ear discharge and  nosebleeds.   Eyes:  Negative for blurred vision.  Respiratory:  Negative for cough, hemoptysis, sputum production, shortness of breath and wheezing.   Cardiovascular:  Negative for chest pain, palpitations, orthopnea and claudication.  Gastrointestinal:  Negative for abdominal pain, blood in stool, constipation, diarrhea, heartburn, melena, nausea and vomiting.  Genitourinary:  Negative for dysuria, flank pain, frequency, hematuria and urgency.  Musculoskeletal:  Negative for back pain, joint pain and myalgias.  Skin:  Negative for rash.  Neurological:  Negative for dizziness, tingling, focal weakness, seizures, weakness and headaches.  Endo/Heme/Allergies:  Does not bruise/bleed easily.  Psychiatric/Behavioral:  Negative for depression and suicidal ideas. The patient does not have insomnia.     Allergies[1]  Past Medical History:  Diagnosis Date   Allergy    Anemia    Hypertension     Past Surgical History:  Procedure Laterality Date   STENT PLACEMENT VASCULAR ([facility] HX)     TUBAL LIGATION      Social History   Socioeconomic History   Marital status: Single    Spouse name: Not on file   Number of children: Not on file   Years of education: Not on file   Highest education level: Some college, no degree  Occupational History   Not on file  Tobacco Use   Smoking status: Never   Smokeless tobacco: Never  Vaping Use   Vaping status: Never Used  Substance and Sexual Activity   Alcohol use: Yes    Alcohol/week: 3.0 standard drinks of alcohol    Types: 1 Glasses of wine, 2 Shots of liquor per  week   Drug use: Yes    Types: Marijuana   Sexual activity: Yes    Birth control/protection: Other-see comments    Comment: Tubal ligation  Other Topics Concern   Not on file  Social History Narrative   Not on file   Social Drivers of Health   Tobacco Use: Low Risk (04/14/2024)   Patient History    Smoking Tobacco Use: Never    Smokeless Tobacco Use: Never    Passive  Exposure: Not on file  Financial Resource Strain: Low Risk (03/22/2024)   Overall Financial Resource Strain (CARDIA)    Difficulty of Paying Living Expenses: Not hard at all  Food Insecurity: No Food Insecurity (03/22/2024)   Epic    Worried About Radiation Protection Practitioner of Food in the Last Year: Never true    Ran Out of Food in the Last Year: Never true  Transportation Needs: No Transportation Needs (03/22/2024)   Epic    Lack of Transportation (Medical): No    Lack of Transportation (Non-Medical): No  Physical Activity: Insufficiently Active (03/22/2024)   Exercise Vital Sign    Days of Exercise per Week: 2 days    Minutes of Exercise per Session: 10 min  Stress: No Stress Concern Present (03/22/2024)   [employer] of Occupational Health - Occupational Stress Questionnaire    Feeling of Stress: Not at all  Social Connections: Unknown (03/22/2024)   Social Connection and Isolation Panel    Frequency of Communication with Friends and Family: Once a week    Frequency of Social Gatherings with Friends and Family: Once a week    Attends Religious Services: More than 4 times per year    Active Member of Clubs or Organizations: Patient declined    Attends Banker Meetings: Not on file    Marital Status: Never married  Intimate Partner Violence: Not At Risk (02/24/2024)   Epic    Fear of Current or Ex-Partner: No    Emotionally Abused: No    Physically Abused: No    Sexually Abused: No  Depression (PHQ2-9): Low Risk (04/14/2024)   Depression (PHQ2-9)    PHQ-2 Score: 0  Alcohol Screen: Low Risk (03/22/2024)   Alcohol Screen    Last Alcohol Screening Score (AUDIT): 4  Housing: Low Risk (03/22/2024)   Epic    Unable to Pay for Housing in the Last Year: No    Number of Times Moved in the Last Year: 0    Homeless in the Last Year: No  Utilities: Not At Risk (02/24/2024)   Epic    Threatened with loss of utilities: No  Health Literacy: Adequate Health Literacy (10/20/2023)    B1300 Health Literacy    Frequency of need for help with medical instructions: Never    Family History  Problem Relation Age of Onset   Congestive Heart Failure Mother    Pulmonary Hypertension Mother    COPD Mother    Sickle cell anemia Mother    Hypertension Mother    Irritable bowel syndrome Sister    Alcohol abuse Maternal Grandmother     Current Medications[2]  DG Cervical Spine Complete Result Date: 04/12/2024 CLINICAL DATA:  Shoulder and neck pain EXAM: DG CERVICAL SPINE COMPLETE 4+V COMPARISON:  02/25/2024 FINDINGS: Straightening of the cervical spine. Mild degenerative osteophytes C4 through C7 with mild disc space narrowing C6-C7. Normal prevertebral soft tissue thickness. No suspicious change in alignment with flexion or extension. IMPRESSION: Straightening of the cervical spine with mild degenerative changes. Electronically  Signed   By: Luke Bun M.D.   On: 04/12/2024 00:26    No images are attached to the encounter.      Latest Ref Rng & Units 02/29/2024    2:58 PM  CMP  Glucose 70 - 99 mg/dL 92   BUN 6 - 24 mg/dL 12   Creatinine 9.42 - 1.00 mg/dL 9.09   Sodium 865 - 855 mmol/L 138   Potassium 3.5 - 5.2 mmol/L 4.0   Chloride 96 - 106 mmol/L 102   CO2 20 - 29 mmol/L 24   Calcium 8.7 - 10.2 mg/dL 9.5   Total Protein 6.0 - 8.5 g/dL 7.3   Total Bilirubin 0.0 - 1.2 mg/dL 0.5   Alkaline Phos 41 - 116 IU/L 62   AST 0 - 40 IU/L 10   ALT 0 - 32 IU/L 7       Latest Ref Rng & Units 03/21/2024   12:57 PM  CBC  WBC 4.0 - 10.5 K/uL 7.3   Hemoglobin 12.0 - 15.0 g/dL 89.4   Hematocrit 63.9 - 46.0 % 31.9   Platelets 150 - 400 K/uL 470      Observation/objective: Appears in no acute distress over video visit today.  Breathing is nonlabored  Assessment and plan: Patient is a 44 year old female here for routine follow-up of following issues:  Pulmonary embolism incidentally noted in November 2025: I would like patient to take of anticoagulation and  following that we will consider stopping it altogether in May 2026.  Discussed results of hypercoagulable workup including factor V Leiden prothrombin gene mutation, protein C protein S and Antithrombin III  levels as well as workup for antiphospholipid antibody syndrome panel which was all unremarkable.  Patient is not on any birth control either on the cause of pulmonary embolism is unclear.  Patient would not like to stay on lifelong anticoagulation and therefore it would be reasonable to see how she does after stopping anticoagulation in May 2026  History of iron  deficiency Anemia: Patient is mildly anemic with an H&H of 10.5/31.9 with an MCV of 80.2.  Ferritin levels were low at 18 with an iron  saturation of 10%.  She is undecided about IV iron  at this time and we will reach out to her next week regarding her decision.  Follow-up instructions: As above  I discussed the assessment and treatment plan with the patient. The patient was provided an opportunity to ask questions and all were answered. The patient agreed with the plan and demonstrated an understanding of the instructions.   The patient was advised to call back or seek an in-person evaluation if the symptoms worsen or if the condition fails to improve as anticipated.  I provided 13 minutes of face-to-face video visit time during this encounter, and > 50% was spent counseling as documented under my assessment & plan.  Visit Diagnosis: 1. Other iron  deficiency anemia     Dr. Annah Skene, MD, MPH [facility] Tel- [phone] 04/16/2024 5:46 PM     [1] No Known Allergies [2]  Current Outpatient Medications:    acetaminophen  (TYLENOL ) 325 MG tablet, Take 2 tablets (650 mg total) by mouth every 6 (six) hours as needed for mild pain (pain score 1-3) or moderate pain (pain score 4-6)., Disp: , Rfl:    apixaban  (ELIQUIS ) 5 MG TABS tablet, Take 1 tablet (5 mg total) by mouth 2 (two) times daily., Disp: 180  tablet, Rfl: 1   cyclobenzaprine  (FLEXERIL ) 5 MG  tablet, Take 1 tablet (5 mg total) by mouth 3 (three) times daily as needed for muscle spasms., Disp: 45 tablet, Rfl: 1   EPINEPHrine  0.3 mg/0.3 mL IJ SOAJ injection, Inject 0.3 mg into the muscle as needed for anaphylaxis., Disp: 1 each, Rfl: 0   Ferrous Sulfate Dried (SLOW RELEASE IRON ) 45 MG TBCR, Take by mouth., Disp: , Rfl:    folic acid  (FOLVITE ) 1 MG tablet, Take 1 tablet (1 mg total) by mouth daily., Disp: 90 tablet, Rfl: 1   valsartan  (DIOVAN ) 40 MG tablet, Take 1 tablet (40 mg total) by mouth daily., Disp: 60 tablet, Rfl: 3

## 2024-04-18 ENCOUNTER — Telehealth: Payer: Self-pay

## 2024-04-18 NOTE — Telephone Encounter (Signed)
 Called patient per secure chat from Dr. Melanee.  Dr. Melanee requested I reach out to the patient to see if she was agreeable to receiving IV iron  due to results from recent December lab work.   Patient stated she was confused at the fact this wasn't explained during her video visit on 04/14/24; but that she is agreeable to getting IV iron  & will reach out to Dr. Melanee via my chart for further clarity.

## 2024-04-19 ENCOUNTER — Ambulatory Visit: Admitting: Nurse Practitioner

## 2024-04-19 ENCOUNTER — Encounter: Payer: Self-pay | Admitting: Nurse Practitioner

## 2024-04-19 ENCOUNTER — Encounter: Payer: Self-pay | Admitting: Oncology

## 2024-04-19 VITALS — BP 148/98 | HR 80 | Temp 97.6°F | Resp 17 | Ht 65.98 in | Wt 209.6 lb

## 2024-04-19 DIAGNOSIS — Z86711 Personal history of pulmonary embolism: Secondary | ICD-10-CM | POA: Diagnosis not present

## 2024-04-19 DIAGNOSIS — D508 Other iron deficiency anemias: Secondary | ICD-10-CM | POA: Diagnosis not present

## 2024-04-19 DIAGNOSIS — I10 Essential (primary) hypertension: Secondary | ICD-10-CM | POA: Diagnosis not present

## 2024-04-19 DIAGNOSIS — E66811 Obesity, class 1: Secondary | ICD-10-CM | POA: Diagnosis not present

## 2024-04-19 MED ORDER — VALSARTAN-HYDROCHLOROTHIAZIDE 80-12.5 MG PO TABS: 1.0000 | ORAL_TABLET | Freq: Every day | ORAL | 3 refills | Status: AC

## 2024-04-19 NOTE — Assessment & Plan Note (Signed)
 BMI 33.85 with 7 lbs loss, praised for this. Recommended eating smaller high protein, low fat meals more frequently and exercising 30 mins a day 5 times a week with a goal of 10-15lb weight loss in the next 3 months. Patient voiced their understanding and motivation to adhere to these recommendations.

## 2024-04-19 NOTE — Assessment & Plan Note (Signed)
 Acute and unprovoked. Is taking Eliquis , continue this until sees vascular in 6 months. Continue collaboration with hematology and vascular. Recent notes reviewed.

## 2024-04-19 NOTE — Assessment & Plan Note (Signed)
 Chronic, stable.  Followed by hematology.  Last notes reviewed and labs. Has heavier cycles at baseline.

## 2024-04-19 NOTE — Progress Notes (Signed)
 "  BP (!) 148/98 (BP Location: Left Arm, Patient Position: Sitting, Cuff Size: Normal)   Pulse 80   Temp 97.6 F (36.4 C) (Oral)   Resp 17   Ht 5' 5.98 (1.676 m)   Wt 209 lb 9.6 oz (95.1 kg)   LMP 04/09/2024 (Exact Date)   SpO2 95%   BMI 33.85 kg/m    Subjective:    Patient ID: Kathleen [name], female    DOB: [birth year], 44 y.o.   MRN: [id number]  HPI: Kathleen [name] is a 44 y.o. female  Chief Complaint  Patient presents with   Follow-up    Here for follow up   HYPERTENSION without Chronic Kidney Disease Started on Valsartan  on 03/22/24, tolerating without ADR. History of PE on 02/24/24. Is following with hematology for this and iron  deficiency anemia. To get infusions. They recommend maintaining her Eliquis  until May 2026. Has been cutting back on alcohol and MJ use, gummies. Hypertension status: uncontrolled  Satisfied with current treatment? yes Duration of hypertension: chronic BP monitoring frequency:  not checking BP range:  BP medication side effects:  no Medication compliance: good compliance Previous BP meds: Losartan Aspirin: no Recurrent headaches: no Visual changes: no Palpitations: no Dyspnea: no Chest pain: no Lower extremity edema: no Dizzy/lightheaded: no   Relevant past medical, surgical, family and social history reviewed and updated as indicated. Interim medical history since our last visit reviewed. Allergies and medications reviewed and updated.  Review of Systems  Constitutional:  Negative for activity change, appetite change, chills, fatigue and fever.  Respiratory:  Negative for cough, chest tightness, shortness of breath and wheezing.   Cardiovascular:  Positive for leg swelling (occasional). Negative for chest pain and palpitations.  Gastrointestinal: Negative.   Neurological: Negative.   Psychiatric/Behavioral: Negative.      Per HPI unless specifically indicated above     Objective:    BP (!) 148/98 (BP Location: Left Arm,  Patient Position: Sitting, Cuff Size: Normal)   Pulse 80   Temp 97.6 F (36.4 C) (Oral)   Resp 17   Ht 5' 5.98 (1.676 m)   Wt 209 lb 9.6 oz (95.1 kg)   LMP 04/09/2024 (Exact Date)   SpO2 95%   BMI 33.85 kg/m   Wt Readings from Last 3 Encounters:  04/19/24 209 lb 9.6 oz (95.1 kg)  03/25/24 210 lb (95.3 kg)  03/22/24 216 lb 12.8 oz (98.3 kg)    Physical Exam Vitals and nursing note reviewed.  Constitutional:      General: She is awake. She is not in acute distress.    Appearance: She is well-developed and well-groomed. She is obese. She is not ill-appearing or toxic-appearing.  HENT:     Head: Normocephalic.     Right Ear: Hearing and external ear normal.     Left Ear: Hearing and external ear normal.  Eyes:     General: Lids are normal.        Right eye: No discharge.        Left eye: No discharge.     Conjunctiva/sclera: Conjunctivae normal.     Pupils: Pupils are equal, round, and reactive to light.  Neck:     Thyroid : No thyromegaly.     Vascular: No carotid bruit.  Cardiovascular:     Rate and Rhythm: Normal rate and regular rhythm.     Heart sounds: Normal heart sounds. No murmur heard.    No gallop.  Pulmonary:     Effort: Pulmonary effort  is normal. No accessory muscle usage or respiratory distress.     Breath sounds: Normal breath sounds. No decreased breath sounds, wheezing or rales.  Abdominal:     General: Bowel sounds are normal. There is no distension.     Palpations: Abdomen is soft.     Tenderness: There is no abdominal tenderness.  Musculoskeletal:     Cervical back: Normal range of motion and neck supple.     Right lower leg: No edema.     Left lower leg: No edema.  Lymphadenopathy:     Cervical: No cervical adenopathy.  Skin:    General: Skin is warm and dry.  Neurological:     Mental Status: She is alert and oriented to person, place, and time.     Deep Tendon Reflexes: Reflexes are normal and symmetric.     Reflex Scores:       Brachioradialis reflexes are 2+ on the right side and 2+ on the left side.      Patellar reflexes are 2+ on the right side and 2+ on the left side. Psychiatric:        Attention and Perception: Attention normal.        Mood and Affect: Mood normal.        Speech: Speech normal.        Behavior: Behavior normal. Behavior is cooperative.        Thought Content: Thought content normal.     Results for orders placed or performed in visit on 03/21/24  Antithrombin III    Collection Time: 03/21/24  1:41 PM  Result Value Ref Range   AntiThromb III Func 98 75 - 120 %  PROTEIN S PANEL   Collection Time: 03/21/24  1:41 PM  Result Value Ref Range   Protein S Ag, Total 96 60 - 150 %   Protein S Ag, Free 113 61 - 136 %   Protein S Activity 100 63 - 140 %  Protein C, total   Collection Time: 03/21/24  1:41 PM  Result Value Ref Range   Protein C, Total 96 60 - 150 %  Factor 5 leiden   Collection Time: 03/21/24  1:41 PM  Result Value Ref Range   Recommendations-F5LEID: Comment    Reviewed By: Comment   Prothrombin gene mutation   Collection Time: 03/21/24  1:41 PM  Result Value Ref Range   Recommendations-PTGENE: Comment    Reviewed by: Comment   Lupus anticoagulant   Collection Time: 03/21/24  1:41 PM  Result Value Ref Range   dPT 37.3 0.0 - 47.6 sec   dPT Confirm Ratio 0.85 0.00 - 1.34 Ratio   Thrombin  Time 20.2 0.0 - 23.0 sec   PTT Lupus Anticoagulant 38.0 0.0 - 43.5 sec   DRVVT 37.5 0.0 - 47.0 sec   Lupus Anticoag Interp Comment:   Beta-2 -glycoprotein i abs, IgG/M/A   Collection Time: 03/21/24  1:41 PM  Result Value Ref Range   Beta-2  Glyco I IgG <9 0 - 20 GPI IgG units   Beta-2 -Glycoprotein I IgM <9 0 - 32 GPI IgM units   Beta-2 -Glycoprotein I IgA <9 0 - 25 GPI IgA units  Cardiolipin antibodies, IgG, IgM, IgA   Collection Time: 03/21/24  1:41 PM  Result Value Ref Range   Anticardiolipin IgG 10 0 - 14 GPL U/mL   Anticardiolipin IgM <9 0 - 12 MPL U/mL   Anticardiolipin IgA  <9 0 - 11 APL U/mL  Hexagonal Phase Phospholipid   Collection Time:  03/21/24  1:41 PM  Result Value Ref Range   Hex Phosph Neut Test 2 0 - 11 sec  Hex phase phospholipid reflex   Collection Time: 03/21/24  1:41 PM  Result Value Ref Range   Hex phase phospholipid comment Comment       Assessment & Plan:   Problem List Items Addressed This Visit       Cardiovascular and Mediastinum   Essential hypertension - Primary   Chronic, uncontrolled.  BP above goal on 2 checks today, trend down but still above goal. Will change to Valsartan -HCTZ at 80-12.5 MG dosing, educated her on this medication and side effects to monitor for. If she is unable to get combo pill she is to alert PCP and will send in separate scripts. Recommend she monitor BP at least a few mornings a week at home and document. DASH diet at home. Labs today: BMP. Praised for weight loss, 7 lbs.       Relevant Medications   valsartan -hydrochlorothiazide  (DIOVAN -HCT) 80-12.5 MG tablet   Other Relevant Orders   Basic metabolic panel with GFR     Other   Obesity (BMI 30.0-34.9)   BMI 33.85 with 7 lbs loss, praised for this. Recommended eating smaller high protein, low fat meals more frequently and exercising 30 mins a day 5 times a week with a goal of 10-15lb weight loss in the next 3 months. Patient voiced their understanding and motivation to adhere to these recommendations.       Iron  deficiency anemia   Chronic, stable.  Followed by hematology.  Last notes reviewed and labs. Has heavier cycles at baseline.      History of pulmonary embolus (PE)   Acute and unprovoked. Is taking Eliquis , continue this until sees vascular in 6 months. Continue collaboration with hematology and vascular. Recent notes reviewed.        Follow up plan: Return in about 4 weeks (around 05/17/2024) for HTN -- changed medications Valsartan -HCTZ.      "

## 2024-04-19 NOTE — Assessment & Plan Note (Signed)
 Chronic, uncontrolled.  BP above goal on 2 checks today, trend down but still above goal. Will change to Valsartan -HCTZ at 80-12.5 MG dosing, educated her on this medication and side effects to monitor for. If she is unable to get combo pill she is to alert PCP and will send in separate scripts. Recommend she monitor BP at least a few mornings a week at home and document. DASH diet at home. Labs today: BMP. Praised for weight loss, 7 lbs.

## 2024-04-20 ENCOUNTER — Ambulatory Visit: Payer: Self-pay | Admitting: Nurse Practitioner

## 2024-04-20 LAB — BASIC METABOLIC PANEL WITH GFR
BUN/Creatinine Ratio: 9 (ref 9–23)
BUN: 8 mg/dL (ref 6–24)
CO2: 23 mmol/L (ref 20–29)
Calcium: 9 mg/dL (ref 8.7–10.2)
Chloride: 104 mmol/L (ref 96–106)
Creatinine, Ser: 0.87 mg/dL (ref 0.57–1.00)
Glucose: 87 mg/dL (ref 70–99)
Potassium: 4.1 mmol/L (ref 3.5–5.2)
Sodium: 141 mmol/L (ref 134–144)
eGFR: 85 mL/min/1.73

## 2024-04-20 NOTE — Progress Notes (Signed)
 Contacted via MyChart  Good morning Kathleen [name], your labs have returned and remain stable. Great news!!

## 2024-04-27 ENCOUNTER — Other Ambulatory Visit: Payer: Self-pay | Admitting: Oncology

## 2024-04-27 ENCOUNTER — Inpatient Hospital Stay

## 2024-04-27 VITALS — BP 142/98 | HR 88 | Temp 97.2°F | Resp 18

## 2024-04-27 DIAGNOSIS — D508 Other iron deficiency anemias: Secondary | ICD-10-CM

## 2024-04-27 DIAGNOSIS — D509 Iron deficiency anemia, unspecified: Secondary | ICD-10-CM | POA: Diagnosis present

## 2024-04-27 MED ORDER — IRON SUCROSE 20 MG/ML IV SOLN
200.0000 mg | INTRAVENOUS | Status: DC
  Administered 2024-04-27: 200 mg via INTRAVENOUS
  Filled 2024-04-27: qty 10

## 2024-04-29 ENCOUNTER — Telehealth: Payer: Self-pay

## 2024-04-29 NOTE — Telephone Encounter (Signed)
LVM for pt to cb to r/s.

## 2024-05-02 ENCOUNTER — Ambulatory Visit

## 2024-05-05 ENCOUNTER — Inpatient Hospital Stay

## 2024-05-06 ENCOUNTER — Telehealth: Payer: Self-pay | Admitting: Oncology

## 2024-05-06 ENCOUNTER — Ambulatory Visit (INDEPENDENT_AMBULATORY_CARE_PROVIDER_SITE_OTHER)

## 2024-05-06 VITALS — BP 142/103 | HR 84 | Ht 65.98 in | Wt 209.0 lb

## 2024-05-06 DIAGNOSIS — M7582 Other shoulder lesions, left shoulder: Secondary | ICD-10-CM

## 2024-05-06 NOTE — Telephone Encounter (Signed)
 Due to weather, clinic on a delayed start time on 2/2. I called pt and lvm with the updated appt time

## 2024-05-06 NOTE — Progress Notes (Signed)
 "  Office Visit Note   Patient: Kathleen [name]           Date of Birth: Aug 21, 1980           MRN: [id number] Visit Date: 05/06/2024              Requested by: Valerio Melanie DASEN, NP [address] [place],  [place] [address] PCP: Valerio Melanie DASEN, NP   Assessment & Plan: Visit Diagnoses:  1. Rotator cuff tendinitis, left     Plan: Patient much improved, will follow up prn  Orders:  No orders of the defined types were placed in this encounter.    Subjective: Chief Complaint: Left shoulder pain  HPI Patient is a 44 y.o. female who presents for a follow up appointment for the left shoulder. Patient's symptoms are rapidly improving since last visit. Shoulder pain is lateral and trapezius. Treatment to date: none.  Objective: Vital Signs: BP (!) 142/103   Pulse 84   Ht 5' 5.98 (1.676 m)   Wt 94.8 kg   LMP 04/09/2024 (Exact Date)   BMI 33.75 kg/m   Physical Exam Gen: Alert, No Acute Distress left shoulder: Skin intact, no erythema or induration noted. 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   Vitamin D  deficiency 08/16/2019   Low TSH level 08/16/2019   May-Thurner syndrome 06/01/2018   Marijuana smoker 05/25/2018   Osteochondroma of femur, 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   Obesity (BMI 30.0-34.9) 02/22/2018   Essential hypertension 02/22/2018   Past Medical History:  Diagnosis Date   Allergy    Anemia     Hypertension     Family History  Problem Relation Age of Onset   Congestive Heart Failure Mother    Pulmonary Hypertension Mother    COPD Mother    Sickle cell anemia Mother    Hypertension Mother    Irritable bowel syndrome Sister    Alcohol abuse Maternal Grandmother     Past Surgical History:  Procedure Laterality Date   STENT PLACEMENT VASCULAR ([facility] HX)     TUBAL LIGATION     Social History   Occupational History   Not on file  Tobacco Use   Smoking status: Never   Smokeless tobacco: Never  Vaping Use   Vaping status: Never Used  Substance and Sexual Activity   Alcohol use: Yes    Alcohol/week: 3.0 standard drinks of alcohol    Types: 1 Glasses of wine, 2 Shots of liquor per week   Drug use: Yes    Types: Marijuana   Sexual activity: Yes    Birth control/protection: Other-see comments    Comment: Tubal ligation   Current Outpatient Medications  Medication Instructions   acetaminophen  (TYLENOL ) 650 mg, Oral, Every 6 hours PRN   apixaban  (ELIQUIS ) 5 mg, Oral, 2 times daily   cyclobenzaprine  (FLEXERIL ) 5 mg, Oral, 3 times daily PRN   EPINEPHrine  (EPI-PEN) 0.3 mg, Intramuscular, As needed   Ferrous Sulfate Dried (  SLOW RELEASE IRON ) 45 MG TBCR Take by mouth.   folic acid  (FOLVITE ) 1 mg, Oral, Daily   valsartan -hydrochlorothiazide  (DIOVAN -HCT) 80-12.5 MG tablet 1 tablet, Oral, Daily   Allergies as of 05/06/2024   (No Known Allergies)   "

## 2024-05-09 ENCOUNTER — Inpatient Hospital Stay: Attending: Oncology

## 2024-05-09 ENCOUNTER — Inpatient Hospital Stay

## 2024-05-12 ENCOUNTER — Inpatient Hospital Stay: Attending: Oncology

## 2024-05-12 ENCOUNTER — Ambulatory Visit: Payer: Self-pay

## 2024-05-12 ENCOUNTER — Other Ambulatory Visit: Payer: Self-pay

## 2024-05-12 VITALS — BP 118/88 | HR 72 | Temp 98.1°F | Resp 18

## 2024-05-12 DIAGNOSIS — N920 Excessive and frequent menstruation with regular cycle: Secondary | ICD-10-CM

## 2024-05-12 DIAGNOSIS — D508 Other iron deficiency anemias: Secondary | ICD-10-CM

## 2024-05-12 LAB — HEMOGLOBIN AND HEMATOCRIT (CANCER CENTER ONLY)
HCT: 30.6 % — ABNORMAL LOW (ref 36.0–46.0)
Hemoglobin: 10 g/dL — ABNORMAL LOW (ref 12.0–15.0)

## 2024-05-12 LAB — SAMPLE TO BLOOD BANK

## 2024-05-12 MED ORDER — SODIUM CHLORIDE 0.9% FLUSH
10.0000 mL | Freq: Once | INTRAVENOUS | Status: AC | PRN
  Administered 2024-05-12: 10 mL
  Filled 2024-05-12: qty 10

## 2024-05-12 MED ORDER — IRON SUCROSE 20 MG/ML IV SOLN
200.0000 mg | INTRAVENOUS | Status: DC
  Administered 2024-05-12: 200 mg via INTRAVENOUS
  Filled 2024-05-12: qty 10

## 2024-05-12 NOTE — Telephone Encounter (Signed)
 FYI Only or Action Required?: Action required by provider: update on patient condition and please advise on sooner OV or other recommendations.  Patient was last seen in primary care on 04/19/2024 by Cannady, Jolene T, NP.  Called Nurse Triage reporting Menstrual Problem.  Symptoms began a week ago.  Interventions attempted: Rest, hydration, or home remedies.  Symptoms are: gradually worsening.  Triage Disposition: See HCP Within 4 Hours (Or PCP Triage)  Patient/caregiver understands and will follow disposition?: Yes Message from Gattis SQUIBB sent at 05/12/2024 11:58 AM EST  Reason for Triage: on period 8 days and having heavy blood clots and cramps   Reason for Disposition  MODERATE vaginal bleeding (e.g., soaking 1 pad or tampon per hour and present > 6 hours; 1 menstrual cup every 6 hours)  Answer Assessment - Initial Assessment Questions Patient with prolonged heavy menstrual cycle. Usually lasts 7 days but tapers off toward the end. Today is Day 8 with no slowing of blood flow. Producing blood clots. Using period underwear- would have to double up on pads and change every hour- lucky if she lasts to two hours.   On a blood thinner since November- no issues with bleeding until now.   Anemic and scheduled with Iron  infusion this afternoon at 3- labs can be drawn if provider requests- usually if Heme Onc requests- advised to reach out to Heme-Onc provider.   Denies CP, SOB, Fever, Dizziness. Denies weakness. Reports fatigue but works 12hr overnight shifts- might be a little worse. Had some numbness in her legs for a few minutes at one point but has resolved.   Appt with PCP made for Monday- patient calling Heme-Onc provider to discuss labs and what's going on. Please advise if patient can be worked in with PCP tomorrow 2/6 for assessment. Understands ED/UC if symptoms worsen in the interim.    1. BLEEDING SEVERITY: Describe the bleeding that you are having. How much bleeding is there?       Large blood clots 2. ONSET: When did the bleeding begin? Is it continuing now?     DAY 8 3. MENSTRUAL PERIOD: When was the last normal menstrual period? How is this different than your period?     Prolonged with no sign of slowing  4. REGULARITY: How regular are your periods?     Came early on 1/29- typically the 6th of overy month- period prior 1/2 5. ABDOMEN PAIN: Do you have any pain? How bad is the pain?  (e.g., Scale 0-10; none, mild, moderate, or severe)     Menstrual cramping  6. PREGNANCY: Is there any chance you are pregnant? When was your last menstrual period?     denies 8. HORMONE MEDICINES: Are you taking any hormone medicines, prescription or over-the-counter? (e.g., birth control pills, estrogen)     denies 9. BLOOD THINNER MEDICINES: Do you take any blood thinners? (e.g., Coumadin / warfarin, Pradaxa / dabigatran, aspirin)     Eliquis  10. CAUSE: What do you think is causing the bleeding? (e.g., recent gyn surgery, recent gyn procedure; known bleeding disorder, cervical cancer, polycystic ovarian disease, fibroids)         Cervix checked 1/22 at GYN  11. HEMODYNAMIC STATUS: Are you weak or feeling lightheaded? If Yes, ask: Can you stand and walk normally?        Tired- denies weakness or light headed 12. OTHER SYMPTOMS: What other symptoms are you having with the bleeding? (e.g., passed tissue, vaginal discharge, fever, menstrual-type cramps)  Tingling in toes  Protocols used: Vaginal Bleeding - Abnormal-A-AH

## 2024-05-13 NOTE — Telephone Encounter (Signed)
 Called and LVM asking for patient to please return my call.   OK for E2C2 to speak to patient and relay Jolene's message to her if she calls back.

## 2024-05-16 ENCOUNTER — Ambulatory Visit: Admitting: Nurse Practitioner

## 2024-05-19 ENCOUNTER — Inpatient Hospital Stay

## 2024-05-23 ENCOUNTER — Ambulatory Visit: Admitting: Nurse Practitioner

## 2024-05-24 ENCOUNTER — Inpatient Hospital Stay

## 2024-05-26 ENCOUNTER — Inpatient Hospital Stay

## 2024-06-29 ENCOUNTER — Inpatient Hospital Stay

## 2024-08-23 ENCOUNTER — Ambulatory Visit (INDEPENDENT_AMBULATORY_CARE_PROVIDER_SITE_OTHER): Payer: Self-pay | Admitting: Vascular Surgery

## 2024-08-23 ENCOUNTER — Encounter (INDEPENDENT_AMBULATORY_CARE_PROVIDER_SITE_OTHER)

## 2024-08-26 ENCOUNTER — Inpatient Hospital Stay

## 2024-08-26 ENCOUNTER — Inpatient Hospital Stay: Admitting: Oncology

## 2024-10-21 ENCOUNTER — Encounter: Admitting: Nurse Practitioner
